# Patient Record
Sex: Male | Born: 1960
Health system: Southern US, Community
[De-identification: ages and names within clinical notes are randomized; demographics above are authoritative.]

## PROBLEM LIST (undated history)

## (undated) DIAGNOSIS — T783XXA Angioneurotic edema, initial encounter: Secondary | ICD-10-CM

## (undated) DIAGNOSIS — Z9989 Dependence on other enabling machines and devices: Secondary | ICD-10-CM

## (undated) DIAGNOSIS — F32A Depression, unspecified: Secondary | ICD-10-CM

## (undated) DIAGNOSIS — J449 Chronic obstructive pulmonary disease, unspecified: Secondary | ICD-10-CM

## (undated) DIAGNOSIS — K649 Unspecified hemorrhoids: Secondary | ICD-10-CM

## (undated) DIAGNOSIS — E119 Type 2 diabetes mellitus without complications: Secondary | ICD-10-CM

## (undated) DIAGNOSIS — E782 Mixed hyperlipidemia: Secondary | ICD-10-CM

## (undated) DIAGNOSIS — G4700 Insomnia, unspecified: Secondary | ICD-10-CM

## (undated) DIAGNOSIS — I10 Essential (primary) hypertension: Secondary | ICD-10-CM

## (undated) DIAGNOSIS — K648 Other hemorrhoids: Secondary | ICD-10-CM

## (undated) DIAGNOSIS — J329 Chronic sinusitis, unspecified: Secondary | ICD-10-CM

## (undated) DIAGNOSIS — F411 Generalized anxiety disorder: Secondary | ICD-10-CM

## (undated) DIAGNOSIS — K5790 Diverticulosis of intestine, part unspecified, without perforation or abscess without bleeding: Secondary | ICD-10-CM

## (undated) DIAGNOSIS — G4733 Obstructive sleep apnea (adult) (pediatric): Secondary | ICD-10-CM

## (undated) DIAGNOSIS — E230 Hypopituitarism: Secondary | ICD-10-CM

## (undated) DIAGNOSIS — R5382 Chronic fatigue, unspecified: Secondary | ICD-10-CM

## (undated) DIAGNOSIS — F40241 Acrophobia: Secondary | ICD-10-CM

## (undated) DIAGNOSIS — F329 Major depressive disorder, single episode, unspecified: Secondary | ICD-10-CM

## (undated) DIAGNOSIS — D126 Benign neoplasm of colon, unspecified: Secondary | ICD-10-CM

## (undated) DIAGNOSIS — M75100 Unspecified rotator cuff tear or rupture of unspecified shoulder, not specified as traumatic: Secondary | ICD-10-CM

## (undated) HISTORY — PX: OTHER SURGICAL HISTORY: SHX169

## (undated) HISTORY — DX: Diverticulosis of intestine, part unspecified, without perforation or abscess without bleeding: K57.90

## (undated) HISTORY — PX: COLONOSCOPY: SHX174

## (undated) HISTORY — PX: WISDOM TOOTH EXTRACTION: SHX21

## (undated) HISTORY — DX: Hypopituitarism: E23.0

## (undated) HISTORY — DX: Mixed hyperlipidemia: E78.2

## (undated) HISTORY — DX: Dependence on other enabling machines and devices: Z99.89

## (undated) HISTORY — DX: Insomnia, unspecified: G47.00

## (undated) HISTORY — DX: Angioneurotic edema, initial encounter: T78.3XXA

## (undated) HISTORY — DX: Chronic sinusitis, unspecified: J32.9

## (undated) HISTORY — PX: HEMORRHOID BANDING: SHX5850

## (undated) HISTORY — DX: Chronic obstructive pulmonary disease, unspecified: J44.9

## (undated) HISTORY — PX: POLYPECTOMY: SHX149

## (undated) HISTORY — DX: Acrophobia: F40.241

## (undated) HISTORY — DX: Essential (primary) hypertension: I10

## (undated) HISTORY — DX: Chronic fatigue, unspecified: R53.82

## (undated) HISTORY — DX: Unspecified hemorrhoids: K64.9

## (undated) HISTORY — DX: Depression, unspecified: F32.A

## (undated) HISTORY — DX: Obstructive sleep apnea (adult) (pediatric): G47.33

## (undated) HISTORY — DX: Unspecified rotator cuff tear or rupture of unspecified shoulder, not specified as traumatic: M75.100

## (undated) HISTORY — DX: Major depressive disorder, single episode, unspecified: F32.9

## (undated) HISTORY — DX: Type 2 diabetes mellitus without complications: E11.9

## (undated) HISTORY — DX: Other hemorrhoids: K64.8

## (undated) HISTORY — DX: Generalized anxiety disorder: F41.1

## (undated) HISTORY — PX: CARDIOVASCULAR STRESS TEST: SHX262

## (undated) HISTORY — DX: Benign neoplasm of colon, unspecified: D12.6

---

## 2004-02-06 ENCOUNTER — Ambulatory Visit (HOSPITAL_COMMUNITY): Admission: RE | Admit: 2004-02-06 | Discharge: 2004-02-06 | Payer: Self-pay | Admitting: Orthopedic Surgery

## 2006-04-27 ENCOUNTER — Ambulatory Visit: Payer: Self-pay | Admitting: Gastroenterology

## 2006-05-04 ENCOUNTER — Ambulatory Visit: Payer: Self-pay | Admitting: Gastroenterology

## 2006-05-07 ENCOUNTER — Ambulatory Visit: Payer: Self-pay | Admitting: Gastroenterology

## 2006-05-07 ENCOUNTER — Encounter (INDEPENDENT_AMBULATORY_CARE_PROVIDER_SITE_OTHER): Payer: Self-pay | Admitting: *Deleted

## 2006-05-07 ENCOUNTER — Encounter (INDEPENDENT_AMBULATORY_CARE_PROVIDER_SITE_OTHER): Payer: Self-pay | Admitting: Specialist

## 2009-08-31 DIAGNOSIS — M75100 Unspecified rotator cuff tear or rupture of unspecified shoulder, not specified as traumatic: Secondary | ICD-10-CM

## 2009-08-31 HISTORY — DX: Unspecified rotator cuff tear or rupture of unspecified shoulder, not specified as traumatic: M75.100

## 2009-10-23 ENCOUNTER — Encounter (INDEPENDENT_AMBULATORY_CARE_PROVIDER_SITE_OTHER): Payer: Self-pay | Admitting: *Deleted

## 2009-10-23 ENCOUNTER — Ambulatory Visit: Payer: Self-pay | Admitting: Gastroenterology

## 2009-10-23 DIAGNOSIS — K625 Hemorrhage of anus and rectum: Secondary | ICD-10-CM

## 2009-11-13 ENCOUNTER — Ambulatory Visit: Payer: Self-pay | Admitting: Gastroenterology

## 2010-09-22 ENCOUNTER — Encounter: Payer: Self-pay | Admitting: Orthopedic Surgery

## 2010-10-01 ENCOUNTER — Other Ambulatory Visit (HOSPITAL_BASED_OUTPATIENT_CLINIC_OR_DEPARTMENT_OTHER): Payer: Self-pay | Admitting: Family Medicine

## 2010-10-01 DIAGNOSIS — M7989 Other specified soft tissue disorders: Secondary | ICD-10-CM

## 2010-10-02 NOTE — Procedures (Signed)
Summary: Colon   Colonoscopy  Procedure date:  05/07/2006  Findings:      Location:  [facility].   Patient Name: Douglas [name], Douglas [name] MRN:  Procedure Procedures: Colonoscopy CPT: [phone].    with polypectomy. CPT: [id number].  Personnel: Endoscopist: Rachael Fee, MD.  Exam Location: Exam performed in Endoscopy Suite. Outpatient  Patient Consent: Procedure, Alternatives, Risks and Benefits discussed, consent obtained, from patient. Consent was obtained by the RN.  Indications Symptoms: Hematochezia. Abdominal pain / bloating.  History  Current Medications: Patient is not currently taking Coumadin.  Comments: Patient history reviewed and updated, pre-procedure physical performed prior to initiation of sedation? yes Pre-Exam Physical: Performed May 07, 2006. Cardio-pulmonary exam, Abdominal exam, Mental status exam WNL.  Exam Exam: Extent of exam reached: Cecum, extent intended: Cecum.  The cecum was identified by appendiceal orifice and IC valve. Patient position: on left side. Time to Cecum: 00:03:23. Time for Withdrawl: 00:11:10. Colon retroflexion performed. Images taken. ASA Classification: II. Tolerance: good.  Monitoring: Pulse and BP monitoring, Oximetry used. Supplemental O2 given.  Colon Prep Prep results: good.  Sedation Meds: Patient assessed and found to be appropriate for moderate (conscious) sedation. Fentanyl 75 mcg. given IV. Versed 8 mg. given IV.  Findings POLYP: Cecum, Maximum size: 2 mm. sessile polyp. Procedure:  snare without cautery, removed, retrieved, Polyp sent to pathology. Polyp sent to pathology. ICD9: Colon Polyps: 211.3. Comments: polyp at appendiceal orifice.  POLYP: Sigmoid Colon, Maximum size: 7 mm. sessile polyp. Procedure:  snare with cautery, removed, retrieved, sent to pathology. sent to pathology. ICD9: Colon Polyps: 211.3.  - DIVERTICULOSIS: Sigmoid Colon to Descending Colon. ICD9: Diverticulosis, Colon:  562.10.  - NORMAL EXAM: Cecum to Rectum. Comments: otherwise normal examination.   Assessment Abnormal examination, see findings above.  Diagnoses: 211.3: Colon Polyps.  562.10: Diverticulosis, Colon.   Comments: Two small colon polyps, no cancers. Events  Unplanned Interventions: No intervention was required.  Unplanned Events: There were no complications. Plans Comments: If the polyps are tubular adenomas, he will need a surveillance colonoscopy in 5 years.  Otherwise he should continue to follow colorectal cancer screening guidelines for "routine risk" patients with a colonoscopy in 10 years. Scheduling/Referral: Await pathology to schedule patient.  This report was created from the original endoscopy report, which was reviewed and signed by the above listed endoscopist.

## 2010-10-02 NOTE — Miscellaneous (Signed)
Summary: rx  Clinical Lists Changes  Medications: Added new medication of ANALPRAM-HC 1-2.5 %  CREA (HYDROCORTISONE ACE-PRAMOXINE) apply to anus 1-2 times a day as needed - Signed Rx of ANALPRAM-HC 1-2.5 %  CREA (HYDROCORTISONE ACE-PRAMOXINE) apply to anus 1-2 times a day as needed;  #1 month x 2;  Signed;  Entered by: Rachael Fee MD;  Authorized by: Rachael Fee MD;  Method used: [facility]  [address] [phone] [address] [place], [place], [place]  [address], Ph: [phone] or [phone], Fax: [phone]    Prescriptions: ANALPRAM-HC 1-2.5 %  CREA (HYDROCORTISONE ACE-PRAMOXINE) apply to anus 1-2 times a day as needed  #1 month x 2   Entered and Authorized by:   Rachael Fee MD   Signed by:   Rachael Fee MD on 10/23/2009   Method used:   [facility] to        [facility]  [address] [phone] ([facility]ail)       [address] [place], [place]  [address]       Ph: [phone] or [phone]       Fax: [phone]   RxID:   [id number]

## 2010-10-02 NOTE — Procedures (Signed)
Summary: Flexible Sigmoidoscopy  Patient: Douglas [name] Note: All result statuses are Final unless otherwise noted.  Tests: (1) Flexible Sigmoidoscopy (FLX)  FLX Flexible Sigmoidoscopy                             DONE     [facility]     [address]     [place], [place]  [address]           FLEXIBLE SIGMOIDOSCOPY PROCEDURE REPORT           PATIENT:  Douglas, [name]  MR#:  [id number]     BIRTHDATE:  1961/06/10, 48 yrs. old  GENDER:  male     ENDOSCOPIST:  Rachael Fee, MD     PROCEDURE DATE:  11/13/2009     PROCEDURE:  Flexible Sigmoidoscopy, diagnostic     ASA CLASS:  Class II     INDICATIONS:  intermittent rectal bleeding, known hemorrhoids;     ?firm area on recent DRE; had adenomas removed 2012     MEDICATIONS:   Fentanyl 50 mcg IV, Versed 7 mg IV           DESCRIPTION OF PROCEDURE:   After the risks benefits and     alternatives of the procedure were thoroughly explained, informed     consent was obtained.  Digital rectal exam was performed and     revealed no rectal masses.   The [id number] endoscope     was introduced through the anus and advanced to the splenic     flexure, without limitations.  The quality of the prep was good.     The instrument was then slowly withdrawn as the mucosa was fully     examined.     <<PROCEDUREIMAGES>>           The rectum and sigmoid and descending colon segments all appeared     normal (see image2, image1, and image4).   Retroflexed views in     the rectum revealed no abnormalities.    The scope was then     withdrawn from the patient and the procedure terminated.           COMPLICATIONS:  None           ENDOSCOPIC IMPRESSION:     1) Normal examination to the splenic flexure     2) Currently no hemorrhoids           RECOMMENDATIONS:     Continue analpram ointment as needed.  If intermittent     hemorrhoidal bleeding becomes more bothersome, would refer to     general surgery.     You will be  scheduled for FULL colonoscopy 05/2011 given personal     history of adenomatous polyps in 2007.           ______________________________     Rachael Fee, MD           n.     eSIGNED:   Rachael Fee at 11/13/2009 02:08 PM           Infantino, Tinnie Gens, [id number]  Note: An exclamation mark (!) indicates a result that was not dispersed into the flowsheet. Document Creation Date: 11/13/2009 2:09 PM _______________________________________________________________________  (1) Order result status: Final Collection or observation date-time: 11/13/2009 14:03 Requested date-time:  Receipt date-time:  Reported date-time:  Referring Physician:   Ordering Physician: Rob Bunting [phone]) Specimen Source:  Source:  [facility] Order Number: [id number] Lab site:   Appended Document: Flexible Sigmoidoscopy    Clinical Lists Changes  Observations: Added new observation of COLONNXTDUE: 05/2011 (11/13/2009 15:15)

## 2010-10-02 NOTE — Letter (Signed)
Summary: [facility]  [address] [place], [place] [address]   Phone: [phone]  Fax: [phone]       Douglas [name]    [birth year]    MRN: [id number]        Procedure Day /Date:11/13/09     Arrival Time:1 pm     Procedure Time:2 pm     Location of Procedure:                    [facility] (4th Floor)   PREPARATION FOR FLEXIBLE SIGMOIDOSCOPY WITH MAGNESIUM CITRATE  Prior to the day before your procedure, purchase one 8 oz. bottle of Magnesium Citrate and one Fleet Enema from the laxative section of your drugstore.  _________________________________________________________________________________________________  THE DAY BEFORE YOUR PROCEDURE             DATE: 11/12/09      DAY: TUE  1.   Have a clear liquid dinner the night before your procedure.  2.   Do not drink anything colored red or purple.  Avoid juices with pulp.  No orange juice.              CLEAR LIQUIDS INCLUDE: Water Jello Ice Popsicles Tea (sugar ok, no milk/cream) Powdered fruit flavored drinks Coffee (sugar ok, no milk/cream) Gatorade Juice: apple, white grape, white cranberry  Lemonade Clear bullion, consomm, broth Carbonated beverages (any kind) Strained chicken noodle soup Hard Candy   3.   At 7:00 pm the night before your procedure, drink one bottle of Magnesium Citrate over ice.  4.   Drink at least 3 more glasses of clear liquids before bedtime (preferably juices).  5.   Results are expected usually within 1 to 6 hours after taking the Magnesium Citrate.  ___________________________________________________________________________________________________  THE DAY OF YOUR PROCEDURE            DATE: 11/13/09     DAY:WED  1.   Use Fleet Enema one hour prior to coming for procedure.  2.   You may drink clear liquids until 12 noon (2 hours before exam)       MEDICATION INSTRUCTIONS  Unless otherwise instructed, you should  take regular prescription medications with a small sip of water as early as possible the morning of your procedure.           OTHER INSTRUCTIONS  You will need a responsible adult at least 52 years of age to accompany you and drive you home.   This person must remain in the waiting room during your procedure.  Wear loose fitting clothing that is easily removed.  Leave jewelry and other valuables at home.  However, you may wish to bring a book to read or an iPod/MP3 player to listen to music as you wait for your procedure to start.  Remove all body piercing jewelry and leave at home.  Total time from sign-in until discharge is approximately 2-3 hours.  You should go home directly after your procedure and rest.  You can resume normal activities the day after your procedure.  The day of your procedure you should not:   Drive   Make legal decisions   Operate machinery   Drink alcohol   Return to work  You will receive specific instructions about eating, activities and medications before you leave.   The above instructions have been reviewed and explained to me by   _______________________    I fully understand and can verbalize  these instructions _____________________________ Date _________

## 2010-10-02 NOTE — Assessment & Plan Note (Signed)
  Review of gastrointestinal problems: 1. adenomatous colon polyps removed by Dr. Christella [name] during colonoscopy 05/2006; next colonoscopy 05/2011. 2. Diverticulosis noted on 2007 colonoscopy    History of Present Illness Visit Type: Initial Consult Primary GI MD: Douglas Bunting MD Primary Provider: n/a Chief Complaint: hemorrhoids  with occasional rectal bleeding History of Present Illness:     very pleasant 50 year old man whom I last saw about 3 years ago. That was at the time of a colonoscopy, see those results above.  who has been taking citrucel about every other day. Has been having problems with hemorrhoids, painful for about a year. He still has alternating bowel habits.  [place].  The hemorrhoids can bleed, usually they are just uncomfortable and he gets some drainage at the site.  Will put OTC creams (like Prep H) at the site.  HE is not constipated at all.           Current Medications (verified): 1)  None  Allergies (verified): No Known Drug Allergies  Vital Signs:  Patient profile:   50 year old male Height:      71 inches Weight:      210.50 pounds BMI:     29.46 Pulse rate:   80 / minute Pulse rhythm:   regular BP sitting:   132 / 86  (left arm)  Vitals Entered By: Douglas [name] NCMA (October 23, 2009 10:32 AM)  Physical Exam  Additional Exam:  Constitutional: generally well appearing Psychiatric: alert and oriented times 3 Abdomen: soft, non-tender, non-distended, normal bowel sounds anorectal examination: small, nonthrombosed external anal hemorrhoid; palpated rather firm area right lateral proximal anal canal, distal rectum. This may be a firm hemorrhoid. Stool is brown   Impression & Recommendations:  Problem # 1:  intermittent rectal bleeding, anal discomfort I suspect this is indeed just intermittent hemorrhoidal issues however I felt that somewhat firm area in his distal rectum and I think he should proceed with  flexible sigmoidoscopy at his soonest convenience to make sure there is nothing more significant going on such as a neoplastic process. In the meantime he will try Analpram ointment to the area when he has flares.  Patient Instructions: 1)  We will set up flexible sigmoidoscopy. 2)  Analpram called in to pharmacy, apply once to twice daily to anus as needed. 3)  The medication list was reviewed and reconciled.  All changed / newly prescribed medications were explained.  A complete medication list was provided to the patient / caregiver.  Appended Document: Orders Update/Flex    Clinical Lists Changes  Problems: Added new problem of RECTAL BLEEDING (ICD-569.3) Orders: Added new Test order of Flex with Sedation (Flex w/Sed) - Signed

## 2010-10-04 ENCOUNTER — Other Ambulatory Visit (HOSPITAL_BASED_OUTPATIENT_CLINIC_OR_DEPARTMENT_OTHER): Payer: Self-pay

## 2010-10-07 ENCOUNTER — Other Ambulatory Visit (HOSPITAL_BASED_OUTPATIENT_CLINIC_OR_DEPARTMENT_OTHER): Payer: Self-pay | Admitting: Family Medicine

## 2010-10-07 DIAGNOSIS — Z139 Encounter for screening, unspecified: Secondary | ICD-10-CM

## 2010-10-08 ENCOUNTER — Ambulatory Visit (HOSPITAL_BASED_OUTPATIENT_CLINIC_OR_DEPARTMENT_OTHER)
Admission: RE | Admit: 2010-10-08 | Discharge: 2010-10-08 | Disposition: A | Discharge status: Home / Self Care | Payer: 59 | Source: Ambulatory Visit | Attending: Family Medicine | Admitting: Family Medicine

## 2010-10-08 ENCOUNTER — Ambulatory Visit (INDEPENDENT_AMBULATORY_CARE_PROVIDER_SITE_OTHER)
Admission: RE | Admit: 2010-10-08 | Discharge: 2010-10-08 | Disposition: A | Discharge status: Home / Self Care | Payer: 59 | Source: Ambulatory Visit | Attending: Family Medicine | Admitting: Family Medicine

## 2010-10-08 DIAGNOSIS — Z139 Encounter for screening, unspecified: Secondary | ICD-10-CM

## 2010-10-08 DIAGNOSIS — M799 Soft tissue disorder, unspecified: Secondary | ICD-10-CM

## 2010-10-08 DIAGNOSIS — M19019 Primary osteoarthritis, unspecified shoulder: Secondary | ICD-10-CM | POA: Insufficient documentation

## 2010-10-08 DIAGNOSIS — M249 Joint derangement, unspecified: Secondary | ICD-10-CM | POA: Insufficient documentation

## 2010-10-08 DIAGNOSIS — Z01818 Encounter for other preprocedural examination: Secondary | ICD-10-CM | POA: Insufficient documentation

## 2010-10-08 DIAGNOSIS — M7989 Other specified soft tissue disorders: Secondary | ICD-10-CM

## 2010-10-08 DIAGNOSIS — M25519 Pain in unspecified shoulder: Secondary | ICD-10-CM | POA: Insufficient documentation

## 2011-01-16 NOTE — Procedures (Signed)
[facility]                            ABDOMINAL ULTRASOUND REPORT   NAME:Douglas [name], Douglas [name]                  MRN:          [id number]  DATE:05/04/2006                            DOB:          1961-04-01    ACCESSION NUMBER:  [id number].   REFERRING PHYSICIAN:  Rachael Fee, M.D.   READING PHYSICIAN:  Malcom T. Russella Dar, MD, [facility]   PROCEDURE:  Multiplanar abdominal ultrasound imaging was performed in the  upright, supine, right and left lateral decubitus positions.   RESULTS:  Abdominal aorta measures 23 cm in maximal diameter. The inferior  vena cava is patent.   The pancreas is not adequately imaged.   Gallbladder has borderline thickened walls measuring 3.2 mm in great  dimension.  The common bile duct measures 4.7 and is normal.   In the liver there is a mild to moderate increase in echo density with  borderline enlarged size.   The right kidney measures 11.4 cm.  The left kidney measures 10.3 cm.  Both  kidneys are normal in appearance.   The spleen measures 14.4 cm, enlarged.  Marland Kitchen   IMPRESSION:  1. Mild to moderate increase in hepatic echo density.  2. Hepatomegaly.  3. Splenomegaly.  4. Borderline thickened gallbladder walls.                                   Venita Lick. Russella Dar, MD, [facility]   MTS/MedQ  DD:  05/04/2006  DT:  05/05/2006  Job #:  [id number]

## 2011-01-16 NOTE — Assessment & Plan Note (Signed)
[facility]                           GASTROENTEROLOGY OFFICE NOTE   NAME:Douglas [name], Douglas [name]                  MRN:          [id number]  DATE:04/27/2006                            DOB:          04/07/1961    NEW PATIENT VISIT.   The patient was self-referred.   REASON FOR VISIT:  Chronic right upper quadrant discomfort, intermittent  abnormal bowels, blood in the stool.   HPI:  Mr. Hussar is a pleasant 50 year old man who has had right upper  quadrant discomfort since 1992.  He says at the outset of this he was very  sick, he had right upper quadrant pains that lasted for several days.  He  underwent quite a lot of testing including ultrasound, sigmoidoscopy, upper  GI stool studies, blood tests and urine tests and tells me that all of these  were essentially normal.  The acute illness eventually improved, but since  then he has been left with chronic right upper quadrant discomfort that  seems to always be there.  He said the only thing that tends to make this  discomfort better is when he does yoga regularly.  When he stops doing yoga,  the pain does seem to become more noticeable.   He also has intermittent abnormal bowels with some minor bleeding.  He  describes rather scybalous stools, sometimes straining to move his bowels.  His dietary changes have improved his bowel habits, but still he will have  to strain sometimes and have scybalous type stools.   REVIEW OF SYSTEMS:  Is essentially normal and is available on  his nursing  intake sheet.   PAST MEDICAL HISTORY:  1. Depression.  2. Allergy trouble.   CURRENT MEDICINES:  Takes multiple supplements including:  1. Red yeast rice.  2. Coenzyme Q 12.  3. Advance __________ Plus.  4. Men Over Forty multivitamin.  5. Choline 250.  6. Inositol 250.  7. Saw Palmetto.  8. Immune 7-1, 6, 10.  9. A medicine called Thinkfast.   ALLERGIES:  NO KNOWN DRUG ALLERGIES.   SOCIAL  HISTORY:  Single, lives by himself, works as an Field seismologist, nonsmoker, drinks approximately 6 beers a month.   FAMILY HISTORY:  Father with diabetes, father with heart disease; no colon  cancer or colon polyps in family.   PHYSICAL EXAM:  He is 5 feet 11 inches, 205 pounds, blood pressure 140/90,  pulse 64.  CONSTITUTIONAL:  Generally well appearing.  NEUROLOGIC:  Alert and oriented x3.  EYES:  Extraocular movements intact.  MOUTH/OROPHARYNX:  Moist, no lesions.  NECK:  Supple.  No lymphadenopathy.  CARDIOVASCULAR:  Regular rate and rhythm.  LUNGS:  Clear to auscultation bilaterally.  ABDOMEN:  Soft, nontender, nondistended, normal bowel sounds.  EXTREMITIES:  No lower extremity edema.  SKIN:  No rash or lesions on his lower extremities.   ASSESSMENT AND PLAN:  A 50 year old man with alternating bowel habits,  likely irritable syndrome, chronic right upper quadrant discomfort.  1. Chronic right upper quadrant discomfort.  Seems unlikely to be biliary      in nature given its constant nature.  That being  said, he has not had a      workup for it in almost 15 years and so I think repeating an ultrasound      at this time as well as getting a basic set of laboratories including a      CBC and complete metabolic profile is reasonable.  2. His alternating bowel habits with scybalous stools does seem like      probably lack of fiber.  He is also on multiple dietary supplements,      any one of which could possibly be causing constipation or loose      stools.  I recommended that he think very hard about continuing these      medicines, they are quite expensive with very limited data about their      efficacy and their side effects.  I think proceeding with full      colonoscopy as soon as convenient is reasonable given the fact that he      has seen some blood in his stool.  Lastly, I recommend he begin taking      Citrucel fiber supplementation one scoop on a daily  basis.                                   Rachael Fee, MD   DPJ/MedQ  DD:  04/27/2006  DT:  04/27/2006  Job #:  [id number]

## 2011-04-08 ENCOUNTER — Encounter: Payer: Self-pay | Admitting: Family Medicine

## 2011-04-08 ENCOUNTER — Telehealth: Payer: Self-pay | Admitting: Family Medicine

## 2011-04-08 ENCOUNTER — Ambulatory Visit (INDEPENDENT_AMBULATORY_CARE_PROVIDER_SITE_OTHER): Payer: 59 | Admitting: Family Medicine

## 2011-04-08 DIAGNOSIS — E230 Hypopituitarism: Secondary | ICD-10-CM

## 2011-04-08 DIAGNOSIS — G4700 Insomnia, unspecified: Secondary | ICD-10-CM

## 2011-04-08 DIAGNOSIS — E291 Testicular hypofunction: Secondary | ICD-10-CM

## 2011-04-08 DIAGNOSIS — D179 Benign lipomatous neoplasm, unspecified: Secondary | ICD-10-CM

## 2011-04-08 HISTORY — DX: Hypopituitarism: E23.0

## 2011-04-08 MED ORDER — TRAZODONE HCL 50 MG PO TABS: ORAL_TABLET | ORAL | Status: DC

## 2011-04-08 MED ORDER — TESTOSTERONE 12.5 MG/ACT (1%) TD GEL: 5.0000 g | Freq: Every day | TRANSDERMAL | Status: DC

## 2011-04-08 NOTE — Assessment & Plan Note (Signed)
Trial of trazodone 50-100mg  qhs prn.  Therapeutic expectations and side effect profile of medication discussed today.  Patient's questions answered.

## 2011-04-08 NOTE — Assessment & Plan Note (Signed)
Restart androgel 4 pumps once daily.

## 2011-04-08 NOTE — Assessment & Plan Note (Signed)
Reassured pt of benign nature of this mass. Monitor for rapid growth or local symptomatology.

## 2011-04-08 NOTE — Telephone Encounter (Signed)
Please request records from [facility] in [place] and from [facility] in [place].  Thx--PM

## 2011-04-08 NOTE — Progress Notes (Signed)
Office Note 04/08/2011  CC:  Chief Complaint  Patient presents with  . Establish Care    HPI:  Douglas [name] is a 50 y.o. White male who is here to establish care. Patient's most recent primary MD: Brooks Sailors in [place] Old records were not reviewed prior to or during today's visit.  Has hx of hypogonadism and his rx for androgel has lapsed.  Wants to restart this at 4 pumps/day b/c his libido and ED were improved on this, although his testosterone levels reportedly did not come up any.  He was put on shots briefly but says these did not make him feel any better.  Also describes new problem of trouble sleeping over the last 4-5 wks since his wife and daughter have been away visiting [place].  No problem with sleep prior to this.  No depression associated with this, although he does miss them and thinks that the change in routine/not having them around is the reason his sleep is likely not going well.  Mind doesn't shut off.  No signif RLS.  Has never had rx hypnotics but has taken advil PM regularly recently and it helped some but he doesn't want to keep taking this.  Past Medical History  Diagnosis Date  . Depression     Prozac in the past not much help.  Spontaneously resolved.  . Adenomatous colon polyp 05/07/06    Due for repeat colonoscopy 05/2011 (Dr. Christella Hartigan)  . Diverticulosis   . Hypogonadism male     Felt improved on andogel but testost levels did not improve.  . Rotator cuff tear 2011    [facility]: conservative mgmt--symptoms stable as of 04/2011.  . 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    No past surgical history on file.NONE  Family History  Problem Relation Age of Onset  . Heart disease Mother   . Hypertension Mother   . Diabetes Mother   . Heart disease Father   . Hypertension Father   . Diabetes Father     History   Social History  . Marital Status: Married    Spouse Name:  N/A    Number of Children: N/A  . Years of Education: N/A   Occupational History  . Not on file.   Social History Main Topics  . Smoking status: Never Smoker   . Smokeless tobacco: Never Used  . Alcohol Use: Not on file  . Drug Use: Not on file  . Sexually Active: Not on file   Other Topics Concern  . 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  Works out regularly.   MEDS: Currently on NO MEDS (the meds below were rx'd at the end of today's visit.) Outpatient Encounter Prescriptions as of 04/08/2011  Medication Sig Dispense Refill  . Multiple Vitamin (MULTIVITAMIN) tablet Take 1 tablet by mouth daily.        . Testosterone (ANDROGEL PUMP) 1.25 GM/ACT (1%) GEL Place 5 g onto the skin daily.  1 Bottle  5  . traZODone (DESYREL) 50 MG tablet 1-2 tabs po qhs prn  30 tablet  1    No Known Allergies  ROS Review of Systems  Constitutional: Negative for fever, chills, appetite change and fatigue.  HENT: Negative for  ear pain, congestion, sore throat, neck stiffness and dental problem.   Eyes: Negative for discharge, redness and visual disturbance.  Respiratory: Negative for cough, chest tightness, shortness of breath and wheezing.   Cardiovascular: Negative for chest pain, palpitations and leg swelling.  Gastrointestinal: Negative for nausea, vomiting, abdominal pain, diarrhea and blood in stool.  Genitourinary: Negative for dysuria, urgency, frequency, hematuria, flank pain and difficulty urinating.  Musculoskeletal: Negative for myalgias, back pain, joint swelling and arthralgias.  Skin: Negative for pallor and rash.       Focal swelling at the back of left shoulder: noted for 1/2 yr or more, no rapid growth.  Neurological: Negative for dizziness, speech difficulty, weakness and headaches.  Hematological: Negative  for adenopathy. Does not bruise/bleed easily.  Psychiatric/Behavioral: Positive for sleep disturbance (as per HPI). Negative for confusion. The patient is not nervous/anxious.      PE; Blood pressure 120/82, pulse 93, height 5\' 11"  (1.803 m), weight 213 lb (96.616 kg), SpO2 97.00%. Gen: Alert, well appearing.  Patient is oriented to person, place, time, and situation. Left shoulder: posterolateral surface with soft, nontender focal soft tissue mass, moveable. Chest: symmetric expansion, nonlabored respirations.  Clear and equal breath sounds in all lung fields.   CV: RRR, no m/r/g.  Peripheral pulses 2+ and symmetric. EXT: no clubbing, cyanosis, or edema.   Pertinent labs:  none  ASSESSMENT AND PLAN:  New patient: will obtain old records.  Insomnia Trial of trazodone 50-100mg  qhs prn.  Therapeutic expectations and side effect profile of medication discussed today.  Patient's questions answered.   Hypogonadism, male Restart androgel 4 pumps once daily.  Lipoma Reassured pt of benign nature of this mass. Monitor for rapid growth or local symptomatology.     Return for at your convenience over the next 59mo for CPE with fasting labs.

## 2011-04-08 NOTE — Telephone Encounter (Signed)
Faxed requests 04/08/11

## 2011-04-24 ENCOUNTER — Encounter: Payer: Self-pay | Admitting: Family Medicine

## 2011-04-24 ENCOUNTER — Other Ambulatory Visit: Payer: Self-pay | Admitting: Family Medicine

## 2011-04-24 ENCOUNTER — Telehealth: Payer: Self-pay | Admitting: Family Medicine

## 2011-04-24 DIAGNOSIS — Z125 Encounter for screening for malignant neoplasm of prostate: Secondary | ICD-10-CM | POA: Insufficient documentation

## 2011-04-24 MED ORDER — ALPRAZOLAM 1 MG PO TABS: ORAL_TABLET | ORAL | Status: DC

## 2011-04-24 NOTE — Telephone Encounter (Signed)
Patient is requesting xanax because he will be working up high.

## 2011-04-24 NOTE — Telephone Encounter (Signed)
Douglas [name]  faxed to pharmacy.

## 2011-04-24 NOTE — Telephone Encounter (Signed)
Rx printed and ready for him to pick up.--PM

## 2011-05-06 ENCOUNTER — Encounter: Payer: Self-pay | Admitting: Gastroenterology

## 2011-05-26 ENCOUNTER — Encounter: Payer: Self-pay | Admitting: Family Medicine

## 2011-05-26 ENCOUNTER — Other Ambulatory Visit: Payer: Self-pay | Admitting: Family Medicine

## 2011-05-26 ENCOUNTER — Ambulatory Visit (INDEPENDENT_AMBULATORY_CARE_PROVIDER_SITE_OTHER): Payer: 59 | Admitting: Family Medicine

## 2011-05-26 VITALS — BP 137/85 | HR 68 | Temp 98.3°F | Ht 71.0 in | Wt 217.0 lb

## 2011-05-26 DIAGNOSIS — B356 Tinea cruris: Secondary | ICD-10-CM

## 2011-05-26 DIAGNOSIS — G4700 Insomnia, unspecified: Secondary | ICD-10-CM

## 2011-05-26 DIAGNOSIS — Z23 Encounter for immunization: Secondary | ICD-10-CM

## 2011-05-26 MED ORDER — TERBINAFINE HCL 250 MG PO TABS: 250.0000 mg | ORAL_TABLET | Freq: Every day | ORAL | Status: DC

## 2011-05-26 MED ORDER — TETANUS-DIPHTH-ACELL PERTUSSIS 5-2.5-18.5 LF-MCG/0.5 IM SUSP: 0.5000 mL | Freq: Once | INTRAMUSCULAR | Status: DC

## 2011-05-26 NOTE — Progress Notes (Signed)
OFFICE NOTE  05/26/2011  CC:  Chief Complaint  Patient presents with  . Rash    not going away- in private area- X 3-4 months- itches     HPI:   Patient is a 50 y.o. Caucasian male who is here for rash. Onset at least 21mo ago in groin area diffusely, a bit red and itchy.  No pain. Tried jock itch cream for a couple of weeks without help.  Tried some eucerin cream w/out help. Wears stiff pants during daytime at work and notes excessive friction in groin area sometimes. Also, he notes that the trazodone I rx'd for him last visit helped well with his insomnia, used it prn only and doesn't have to use it now b/c his wife and daughter are back from [place].  Pertinent PMH:  No prior hx of similar rash. Hypogonadism Insomnia MEDS;   Outpatient Prescriptions Prior to Visit  Medication Sig Dispense Refill  . ALPRAZolam (XANAX) 1 MG tablet 1 tab po q8h prn anxiety  15 tablet  1  . Multiple Vitamin (MULTIVITAMIN) tablet Take 1 tablet by mouth daily.        . Testosterone (ANDROGEL PUMP) 1.25 GM/ACT (1%) GEL Place 5 g onto the skin daily.  1 Bottle  5   No facility-administered medications prior to visit.    PE: Blood pressure 137/85, pulse 68, temperature 98.3 F (36.8 C), temperature source Oral, height 5\' 11"  (1.803 m), weight 217 lb (98.431 kg), SpO2 96.00%. Gen: Alert, well appearing.  Patient is oriented to person, place, time, and situation. SKIN: groin with well demarcated mildly pinkish flaky rash--a serpiginous line of erythema marks the borders on the medial upper thighs.  No vesicles, no papules, no pustules, no maceration.  Nontender.  IMPRESSION AND PLAN:  Tinea cruris Failed 2 wk topical antifungal trial. Will do 14 day course of lamisil 250mg  tab po qd, RF x1.  Fill RF if not completely resolved after 2 wks. Therapeutic expectations and side effect profile of medication discussed today.  Patient's questions answered. Use aquafor topically qd until rash no longer  present. Discussed preventative measures.  Insomnia Resolved. May use trazodone prn as previously prescribed.   Tdap IM given today.  FOLLOW UP:  Return if symptoms worsen or fail to improve.

## 2011-05-26 NOTE — Assessment & Plan Note (Signed)
Resolved. May use trazodone prn as previously prescribed.

## 2011-05-26 NOTE — Assessment & Plan Note (Signed)
Failed 2 wk topical antifungal trial. Will do 14 day course of lamisil 250mg  tab po qd, RF x1.  Fill RF if not completely resolved after 2 wks. Therapeutic expectations and side effect profile of medication discussed today.  Patient's questions answered. Use aquafor topically qd until rash no longer present. Discussed preventative measures.

## 2011-06-02 ENCOUNTER — Encounter: Payer: Self-pay | Admitting: Family Medicine

## 2011-07-31 ENCOUNTER — Ambulatory Visit (INDEPENDENT_AMBULATORY_CARE_PROVIDER_SITE_OTHER): Payer: 59 | Admitting: Family Medicine

## 2011-07-31 ENCOUNTER — Encounter: Payer: Self-pay | Admitting: Family Medicine

## 2011-07-31 VITALS — BP 162/88 | HR 69 | Temp 97.8°F | Ht 71.0 in | Wt 218.0 lb

## 2011-07-31 DIAGNOSIS — R030 Elevated blood-pressure reading, without diagnosis of hypertension: Secondary | ICD-10-CM

## 2011-07-31 DIAGNOSIS — B369 Superficial mycosis, unspecified: Secondary | ICD-10-CM

## 2011-07-31 DIAGNOSIS — B49 Unspecified mycosis: Secondary | ICD-10-CM

## 2011-07-31 MED ORDER — TERBINAFINE HCL 250 MG PO TABS: 250.0000 mg | ORAL_TABLET | Freq: Every day | ORAL | Status: DC

## 2011-07-31 MED ORDER — MICONAZOLE NITRATE 2 % EX AERP: INHALATION_SPRAY | CUTANEOUS | Status: DC

## 2011-07-31 NOTE — Patient Instructions (Signed)
Follow up as needed

## 2011-07-31 NOTE — Progress Notes (Signed)
OFFICE NOTE  07/31/2011  CC:  Chief Complaint  Patient presents with  . Rash    improved, but did not go away after 2 rounds of meds     HPI: Patient is a 50 y.o. Caucasian male who is here for rash. Took lamisil 250mg  qd x 24mo for tinea cruris that I saw him for a couple of months ago.  The rash resolved but has started to return. Reddish, itchy rash in groin/thigh creases.  Tries to pay attention to keeping things dry. No OTC meds have been applied this time.  Nothing makes it better or worse.  He feels fine otherwise. Denies polyuria, polydipsia, polyphagia, or wt loss.  Pertinent PMH:  Hypogonadism Insomnia Tinea cruris  Pertinent Meds:Cialis, androgel  PE: Blood pressure 162/88, pulse 69, temperature 97.8 F (36.6 C), temperature source Oral, height 5\' 11"  (1.803 m), weight 218 lb (98.884 kg). Gen: Alert, well appearing.  Patient is oriented to person, place, time, and situation. SKIN: pinkish discoloration to skin in groin creases bilat, without maceration and without vesicles or pustules.  Well demarcated borders without satellite lesions.  Scrotum and penis appear normal, without rash.    IMPRESSION AND PLAN: Recurrent tinea cruris. I recommended he take another 2 wk course of lamisil 250mg  po qd. I also recommended he use miconazole powder daily as a preventative measure. Will also screen for diabetes since recurrent fungal infections can sometimes be a sign of hyperglycemia. Fasting labs entered for future lab visit.  FOLLOW UP: prn

## 2011-08-06 ENCOUNTER — Other Ambulatory Visit (INDEPENDENT_AMBULATORY_CARE_PROVIDER_SITE_OTHER): Payer: 59

## 2011-08-06 ENCOUNTER — Other Ambulatory Visit: Payer: Self-pay | Admitting: Family Medicine

## 2011-08-06 DIAGNOSIS — B369 Superficial mycosis, unspecified: Secondary | ICD-10-CM

## 2011-08-06 DIAGNOSIS — R030 Elevated blood-pressure reading, without diagnosis of hypertension: Secondary | ICD-10-CM

## 2011-08-06 DIAGNOSIS — B49 Unspecified mycosis: Secondary | ICD-10-CM

## 2011-08-06 LAB — CBC WITH DIFFERENTIAL/PLATELET
Basophils Absolute: 0 10*3/uL (ref 0.0–0.1)
Eosinophils Absolute: 0.2 10*3/uL (ref 0.0–0.7)
HCT: 42.7 % (ref 39.0–52.0)
Lymphs Abs: 2.3 10*3/uL (ref 0.7–4.0)
Monocytes Absolute: 0.5 10*3/uL (ref 0.1–1.0)
Platelets: 233 10*3/uL (ref 150.0–400.0)
RBC: 4.95 Mil/uL (ref 4.22–5.81)
WBC: 8 10*3/uL (ref 4.5–10.5)

## 2011-08-06 LAB — COMPREHENSIVE METABOLIC PANEL
Albumin: 4.7 g/dL (ref 3.5–5.2)
CO2: 29 mEq/L (ref 19–32)
Calcium: 9.4 mg/dL (ref 8.4–10.5)
GFR: 103.27 mL/min (ref >60.00)
Glucose, Bld: 102 mg/dL — ABNORMAL HIGH (ref 70–99)
Potassium: 4.2 mEq/L (ref 3.5–5.1)
Sodium: 140 mEq/L (ref 135–145)

## 2011-08-06 LAB — LDL CHOLESTEROL, DIRECT: Direct LDL: 60.2 mg/dL

## 2011-08-06 LAB — TSH: TSH: 0.37 u[IU]/mL (ref 0.35–5.50)

## 2011-08-06 LAB — LIPID PANEL
Total CHOL/HDL Ratio: 5
Triglycerides: 352 mg/dL — ABNORMAL HIGH (ref 0.0–149.0)

## 2011-09-30 ENCOUNTER — Ambulatory Visit (INDEPENDENT_AMBULATORY_CARE_PROVIDER_SITE_OTHER): Payer: 59 | Admitting: Family Medicine

## 2011-09-30 ENCOUNTER — Encounter: Payer: Self-pay | Admitting: Family Medicine

## 2011-09-30 VITALS — BP 146/90 | HR 74 | Temp 98.2°F | Ht 71.0 in | Wt 220.0 lb

## 2011-09-30 DIAGNOSIS — R55 Syncope and collapse: Secondary | ICD-10-CM

## 2011-09-30 MED ORDER — TRAZODONE HCL 50 MG PO TABS: ORAL_TABLET | ORAL | Status: DC

## 2011-09-30 MED ORDER — CITALOPRAM HYDROBROMIDE 20 MG PO TABS: 20.0000 mg | ORAL_TABLET | Freq: Every day | ORAL | Status: DC
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
## 2011-09-30 NOTE — Assessment & Plan Note (Signed)
Although he has had some lifestyle habits that have likely contributed to his sx's (not eating, working out while not eating, drinking 5 hour energy drinks), he has had 2 episode that have been unrelated to these things.   His sx's are suggestive of panic disorder and we discussed starting SSRI for this today and he is willing to try. Citalopram 20mg  qd (1/2 tab qd x 5d to start, then increase to whole tab), and he may resume 50mg  trazodone qhs prn insomnia.  Therapeutic expectations and side effect profile of medication discussed today.  Patient's questions answered. He has stopped drinking 5 hr energy drinks and has been careful to eat more regularly (esp prior to working out).   He admits to being a worrier that doesn't show it on the outside, so hopefully the citalopram will be helpful for GAD even if these are not panic attacks he is having.   EKG today unremarkable.  If these episodes worsen or become more suggestive of cardiac etiology will do necessary further w/u. Will check TSH, free T4, and T3 total today b/c his TSH was borderline low on labs 08/2011.

## 2011-11-26 ENCOUNTER — Other Ambulatory Visit: Payer: Self-pay | Admitting: *Deleted

## 2011-11-26 MED ORDER — TESTOSTERONE 12.5 MG/ACT (1%) TD GEL: 5.0000 g | Freq: Every day | TRANSDERMAL | Status: DC

## 2011-11-26 NOTE — Telephone Encounter (Signed)
Faxed refill request received from pharmacy for androgel Last seen on 09/30/11 Follow up needed in one month, but no follow up scheduled. Last filled 04/08/11, #1 x 5 Please advise refills.

## 2011-12-07 ENCOUNTER — Ambulatory Visit (INDEPENDENT_AMBULATORY_CARE_PROVIDER_SITE_OTHER): Payer: 59 | Admitting: Family Medicine

## 2011-12-07 ENCOUNTER — Encounter: Payer: Self-pay | Admitting: Family Medicine

## 2011-12-07 VITALS — BP 132/81 | HR 62 | Temp 98.2°F | Wt 219.0 lb

## 2011-12-07 DIAGNOSIS — T783XXA Angioneurotic edema, initial encounter: Secondary | ICD-10-CM | POA: Insufficient documentation

## 2011-12-07 MED ORDER — PREDNISONE 20 MG PO TABS: ORAL_TABLET | ORAL | Status: DC

## 2011-12-07 MED ORDER — EPINEPHRINE 0.3 MG/0.3ML IJ DEVI: 0.3000 mg | Freq: Once | INTRAMUSCULAR | Status: DC

## 2011-12-07 MED ORDER — TESTOSTERONE 12.5 MG/ACT (1%) TD GEL: 5.0000 g | Freq: Every day | TRANSDERMAL | Status: DC

## 2011-12-07 MED ORDER — METHYLPREDNISOLONE ACETATE 40 MG/ML IJ SUSP
40.0000 mg | Freq: Once | INTRAMUSCULAR | Status: AC
  Administered 2011-12-07: 40 mg via INTRAMUSCULAR

## 2011-12-07 NOTE — Assessment & Plan Note (Signed)
Recurrent, idiopathic. No sign of this reaction progressing past lip swelling at this time (several hours after onset). Recommended 40mg  depo-medrol IM in office today, continue with 40mg  prednisone qd x 4d starting tomorrow.  Continue antihistamine until swelling is completely gone.  Epi-pen rx'd today and demonstrated use, explained scenarios for which use of this would be appropriate. Referral to [facility] was ordered today.

## 2011-12-07 NOTE — Progress Notes (Signed)
OFFICE NOTE  12/07/2011  CC:  Chief Complaint  Patient presents with  . Facial Swelling    upper lip, began this AM     HPI: Patient is a 51 y.o. Caucasian male who is here for upper lip swelling--mild itching and loss of sensation/tingling.  No pain.  Onset this morning almost 4 hours ago while eating banana and drinking water at work.  Started with tingling/pain left corner of lips/mouth for a while prior to that, then "just took off" and the entire upper lip swelled.  No tongue swelling, no sensation of throat closing, no SOB, no dizziness, no malaise.  This is the 4th time over the last 2 mo that this has happened to him--all "minor" compared to this one in that the lip didn't swell as much and the duration of swelling was only a couple of hours.  No antihistamine was tried in the past but this morning about 2 hours ago a coworker gave him two hydroxyzine tabs that his doctor had rx'd him for the same thing. Pt reports other coworkers with similar reactions but has no real details about this: trigger unknown. He has had this happen while at work and also while at home, no trigger identified (black rice started recently, but ? After onset of these episodes).   Pertinent PMH:  Past Medical History  Diagnosis Date  . Depression     Prozac in the past not much help.  Spontaneously resolved.  . Adenomatous colon polyp 05/07/06    Due for repeat colonoscopy 05/2011 (Dr. Christella Hartigan)  . Diverticulosis   . Hypogonadism male     Felt improved on andogel but testost levels did not improve.  . Rotator cuff tear 2011    [facility]: conservative mgmt--symptoms stable as of 04/2011.  . 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  . Insomnia   . Hypertriglyceridemia     fish oil OTC  No FH of angioedema  MEDS:  Outpatient Prescriptions Prior to Visit  Medication Sig Dispense Refill  . CIALIS 20 MG tablet       . Miconazole  Nitrate 2 % AERP Use as directed for prevention of fungal infection    0  . traZODone (DESYREL) 50 MG tablet 1 tab po qhs prn insomnia  30 tablet  1  . citalopram (CELEXA) 20 MG tablet Take 1 tablet (20 mg total) by mouth daily.  30 tablet  1  . Testosterone (ANDROGEL PUMP) 1.25 GM/ACT (1%) GEL Place 5 g onto the skin daily.  1 Bottle  5  . traZODone (DESYREL) 50 MG tablet 1-2 tabs po qhs prn  30 tablet  1  Of note: pt stopped citalopram b/c he says it caused him to have hemorrhoids. He has run out of androgel and needs RF.  He has taken trazodone only once in the last couple of months.  PE: Blood pressure 132/81, pulse 62, temperature 98.2 F (36.8 C), temperature source Temporal, weight 219 lb (99.338 kg). Gen: Alert, well appearing.  Patient is oriented to person, place, time, and situation. Face: upper lip approx 2 times the size of his normal, with small healing apthous ulcer on right side of upper lip.  No surrounding erythema, no tenderness.  Lower lip without swelling, tongue and eyes without swelling, uvula and throat without swelling.   Neck: no LAD, tenderness, mass, or thyromegaly. CV: RRR, no m/r/g.   LUNGS:  CTA bilat, nonlabored resps, good aeration in all lung fields. Skin - no sores or suspicious lesions or rashes or color changes   IMPRESSION AND PLAN:  Angioedema of lips Recurrent, idiopathic. No sign of this reaction progressing past lip swelling at this time (several hours after onset). Recommended 40mg  depo-medrol IM in office today, continue with 40mg  prednisone qd x 4d starting tomorrow.  Continue antihistamine until swelling is completely gone.  Epi-pen rx'd today and demonstrated use, explained scenarios for which use of this would be appropriate. Referral to [facility] was ordered today.      FOLLOW UP: prn

## 2012-01-11 ENCOUNTER — Ambulatory Visit (INDEPENDENT_AMBULATORY_CARE_PROVIDER_SITE_OTHER): Payer: 59 | Admitting: Family Medicine

## 2012-01-11 ENCOUNTER — Encounter: Payer: Self-pay | Admitting: Family Medicine

## 2012-01-11 VITALS — BP 138/86 | HR 76 | Temp 98.4°F | Ht 71.0 in | Wt 216.0 lb

## 2012-01-11 DIAGNOSIS — J449 Chronic obstructive pulmonary disease, unspecified: Secondary | ICD-10-CM

## 2012-01-11 DIAGNOSIS — J45901 Unspecified asthma with (acute) exacerbation: Secondary | ICD-10-CM

## 2012-01-11 DIAGNOSIS — J4489 Other specified chronic obstructive pulmonary disease: Secondary | ICD-10-CM

## 2012-01-11 DIAGNOSIS — J45909 Unspecified asthma, uncomplicated: Secondary | ICD-10-CM

## 2012-01-11 DIAGNOSIS — T783XXA Angioneurotic edema, initial encounter: Secondary | ICD-10-CM

## 2012-01-11 HISTORY — DX: Other specified chronic obstructive pulmonary disease: J44.89

## 2012-01-11 HISTORY — DX: Chronic obstructive pulmonary disease, unspecified: J44.9

## 2012-01-11 MED ORDER — ALBUTEROL SULFATE HFA 108 (90 BASE) MCG/ACT IN AERS: 2.0000 | INHALATION_SPRAY | Freq: Four times a day (QID) | RESPIRATORY_TRACT | Status: DC | PRN

## 2012-01-11 MED ORDER — AZITHROMYCIN 250 MG PO TABS: ORAL_TABLET | ORAL | Status: DC

## 2012-01-11 MED ORDER — PREDNISONE 20 MG PO TABS: ORAL_TABLET | ORAL | Status: DC

## 2012-01-11 NOTE — Assessment & Plan Note (Signed)
Prednisone 40mg  qd x 5d, then 20mg  qd x 5d. Ventolin 2 puffs q4h prn. Z-pack. F/u 5-7d.

## 2012-01-11 NOTE — Progress Notes (Signed)
OFFICE NOTE  01/11/2012  CC:  Chief Complaint  Patient presents with  . Cough    non productive cough, wheezing; improves with prn Mucinex-D     HPI: Patient is a 51 y.o. Caucasian male who is here for respiratory complaints. Pt presents complaining of respiratory symptoms for 3 wks.  Primary symptoms are: nasal congestion w/out drainage, tight, dry cough, chest feels tight.  Worst symptoms seems to be the cough.  Lately the symptoms seem to be worsening.  Works around dust a lot lately. Pertinent negatives: No fevers and no SOB. No pain in face or teeth.  No significant HA.  ST mild at most.   Symptoms made worse by nothing.  Symptoms improved by some OTC cough meds (mucinex). Smoker? no Recent sick contact? Now known Muscle or joint aches? no   Additional ROS: no n/v/d or abdominal pain.  No rash.  No neck stiffness.   +Mild fatigue.  +Mild appetite loss.   Pertinent PMH:  Past Medical History  Diagnosis Date  . Depression     Prozac in the past not much help.  Spontaneously resolved.  . Adenomatous colon polyp 05/07/06    Due for repeat colonoscopy 05/2011 (Dr. Christella Hartigan)  . Diverticulosis   . Hypogonadism male     Felt improved on andogel but testost levels did not improve.  . Rotator cuff tear 2011    [facility]: conservative mgmt--symptoms stable as of 04/2011.  . 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  . Insomnia   . Hypertriglyceridemia     fish oil OTC   Past surgical, social, and family history reviewed and no changes noted since last office visit.  MEDS:  Outpatient Prescriptions Prior to Visit  Medication Sig Dispense Refill  . CIALIS 20 MG tablet Take 20 mg by mouth daily as needed.       Marland Kitchen EPINEPHrine (EPIPEN) 0.3 mg/0.3 mL DEVI Inject 0.3 mLs (0.3 mg total) into the muscle once.  1 Device  3  . Testosterone (ANDROGEL PUMP) 1.25 GM/ACT (1%) GEL Place 5 g onto the skin daily.  1  Bottle  5  . traZODone (DESYREL) 50 MG tablet 1 tab po qhs prn insomnia  30 tablet  1  . citalopram (CELEXA) 20 MG tablet Take 1 tablet (20 mg total) by mouth daily.  30 tablet  1  . Miconazole Nitrate 2 % AERP Use as directed for prevention of fungal infection    0  . predniSONE (DELTASONE) 20 MG tablet 2 tabs po qd x 4d, starting 12/08/11  8 tablet  0  . traZODone (DESYREL) 50 MG tablet 1-2 tabs po qhs prn  30 tablet  1  **not currently taking prednisone  PE: Blood pressure 138/86, pulse 76, temperature 98.4 F (36.9 C), temperature source Temporal, height 5\' 11"  (1.803 m), weight 216 lb (97.977 kg), SpO2 94.00%. VS: noted--normal. Gen: alert, NAD, NONTOXIC APPEARING. HEENT: eyes without injection, drainage, or swelling.  Ears: EACs clear, TMs with normal light reflex and landmarks.  Nose: Clear rhinorrhea, with some dried, crusty exudate adherent to mildly injected mucosa.  No purulent d/c.  No paranasal sinus TTP.  No facial swelling.  Throat and mouth without focal lesion.  No pharyngial swelling, erythema, or exudate.   Neck: supple, no LAD.   LUNGS: diffuse insp rhonchi, diffuse exp wheezing (coarse), with mildly prolonged exp phase.  Nonlabored resps, aeration is decent.  He coughs a lot after expiration.  CV: RRR, no m/r/g. EXT: no c/c/e SKIN: no rash  IMPRESSION AND PLAN:  Acute asthmatic bronchitis Prednisone 40mg  qd x 5d, then 20mg  qd x 5d. Ventolin 2 puffs q4h prn. Z-pack. F/u 5-7d.      FOLLOW UP: 5-7d

## 2012-01-18 ENCOUNTER — Ambulatory Visit (HOSPITAL_BASED_OUTPATIENT_CLINIC_OR_DEPARTMENT_OTHER)
Admission: RE | Admit: 2012-01-18 | Discharge: 2012-01-18 | Disposition: A | Discharge status: Home / Self Care | Payer: 59 | Source: Ambulatory Visit | Attending: Family Medicine | Admitting: Family Medicine

## 2012-01-18 ENCOUNTER — Encounter: Payer: Self-pay | Admitting: Family Medicine

## 2012-01-18 ENCOUNTER — Ambulatory Visit (INDEPENDENT_AMBULATORY_CARE_PROVIDER_SITE_OTHER): Payer: 59 | Admitting: Family Medicine

## 2012-01-18 VITALS — BP 133/81 | HR 75 | Temp 97.9°F | Ht 71.0 in | Wt 217.0 lb

## 2012-01-18 DIAGNOSIS — R0609 Other forms of dyspnea: Secondary | ICD-10-CM

## 2012-01-18 DIAGNOSIS — J45901 Unspecified asthma with (acute) exacerbation: Secondary | ICD-10-CM

## 2012-01-18 DIAGNOSIS — R0600 Dyspnea, unspecified: Secondary | ICD-10-CM

## 2012-01-18 DIAGNOSIS — R05 Cough: Secondary | ICD-10-CM | POA: Insufficient documentation

## 2012-01-18 DIAGNOSIS — R0989 Other specified symptoms and signs involving the circulatory and respiratory systems: Secondary | ICD-10-CM

## 2012-01-18 DIAGNOSIS — R5381 Other malaise: Secondary | ICD-10-CM | POA: Insufficient documentation

## 2012-01-18 DIAGNOSIS — J45909 Unspecified asthma, uncomplicated: Secondary | ICD-10-CM

## 2012-01-18 DIAGNOSIS — R059 Cough, unspecified: Secondary | ICD-10-CM | POA: Insufficient documentation

## 2012-01-18 MED ORDER — IPRATROPIUM BROMIDE 0.02 % IN SOLN
0.5000 mg | Freq: Once | RESPIRATORY_TRACT | Status: AC
  Administered 2012-01-18: 0.5 mg via RESPIRATORY_TRACT

## 2012-01-18 MED ORDER — ALBUTEROL SULFATE (5 MG/ML) 0.5% IN NEBU: 2.5000 mg | INHALATION_SOLUTION | Freq: Once | RESPIRATORY_TRACT | Status: DC

## 2012-01-18 MED ORDER — METHYLPREDNISOLONE ACETATE 40 MG/ML IJ SUSP
40.0000 mg | Freq: Once | INTRAMUSCULAR | Status: AC
  Administered 2012-01-18: 40 mg via INTRAMUSCULAR

## 2012-01-18 NOTE — Assessment & Plan Note (Signed)
No improved with a z-pack, 7d of prednisone, + says ventolin not helping at all when used prn. I noted significant improvement in aeration on exam and he noted subjective improvement in breathing after 2.5mg  alb/0.5mg  atrovent today in office. Plan to give depo-medrol 40mg  IM today in office, continue current steroid taper, and gave rx for aerochamber to see if we can get better delivery of his bronchodilator to the lungs. Will check CXR today as well to r/u consolidation.  At this time, however, no indication for another antibiotic.

## 2012-01-18 NOTE — Progress Notes (Signed)
OFFICE NOTE  01/18/2012  CC:  Chief Complaint  Patient presents with  . Breathing Problem    easily out of breath, fatigued, no energy, sleeping a lot, nasal congestion and cough, finished zithromax; feels worse than before     HPI: Patient is a 51 y.o. Caucasian male who is here for 7 day f/u for recent acute bronchitis illness. No better, maybe worse: fatigue and SOB biggest complaints.  Cough mostly nonproductive.  Some periorbital and facial pressure/tension, not much nasal mucous, though. No chest pain.  +Chest tight.  PO intake good.  No fevers. No n/v/d.  No rash.  + HA.  No ST. No myalgias or arthralgias.  Pertinent PMH:  Past Medical History  Diagnosis Date  . Depression     Prozac in the past not much help.  Spontaneously resolved.  . Adenomatous colon polyp 05/07/06    Due for repeat colonoscopy 05/2011 (Dr. Christella Hartigan)  . Diverticulosis   . Hypogonadism male     Felt improved on andogel but testost levels did not improve.  . Rotator cuff tear 2011    [facility]: conservative mgmt--symptoms stable as of 04/2011.  . 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  . Insomnia   . Hypertriglyceridemia     fish oil OTC  . Angioedema of lips     saw allergist 11/2011    MEDS:  Outpatient Prescriptions Prior to Visit  Medication Sig Dispense Refill  . albuterol (VENTOLIN HFA) 108 (90 BASE) MCG/ACT inhaler Inhale 2 puffs into the lungs every 6 (six) hours as needed for wheezing.  1 Inhaler  0  . CIALIS 20 MG tablet Take 20 mg by mouth daily as needed.       . citalopram (CELEXA) 20 MG tablet Take 1 tablet (20 mg total) by mouth daily.  30 tablet  1  . EPINEPHrine (EPIPEN) 0.3 mg/0.3 mL DEVI Inject 0.3 mLs (0.3 mg total) into the muscle once.  1 Device  3  . Miconazole Nitrate 2 % AERP Use as directed for prevention of fungal infection    0  . predniSONE (DELTASONE) 20 MG tablet 2 tabs po qd x 5d, then 1 tab  po qd x 5d  15 tablet  0  . pseudoephedrine-guaifenesin (MUCINEX D) 60-600 MG per tablet Take 1 tablet by mouth every 12 (twelve) hours as needed.      . Testosterone (ANDROGEL PUMP) 1.25 GM/ACT (1%) GEL Place 5 g onto the skin daily.  1 Bottle  5  . traZODone (DESYREL) 50 MG tablet 1 tab po qhs prn insomnia  30 tablet  1  . azithromycin (ZITHROMAX) 250 MG tablet 2 tabs po qd x 1d, then 1 tab po qd x 4d  6 each  0    PE: Blood pressure 133/81, pulse 75, temperature 97.9 F (36.6 C), temperature source Temporal, height 5\' 11"  (1.803 m), weight 217 lb (98.431 kg), SpO2 94.00%. VS: noted--normal. Gen: alert, NAD, NONTOXIC APPEARING. HEENT: eyes without injection, drainage, or swelling.  Ears: EACs clear, TMs with normal light reflex and landmarks.  Nose: Clear rhinorrhea, with some dried, crusty exudate adherent to mildly injected mucosa.  No purulent d/c.  No paranasal sinus TTP.  No facial swelling.  Throat and mouth without focal lesion.  No pharyngial swelling, erythema, or exudate.   Neck: supple, no LAD.   LUNGS:  Bibasilar insp rhonchi and coarse exp wheezing,  mild prolongation of exp phase. Nonlabored resps.  No insp crackles or areas of focally diminished breath sounds. CV: RRR, no m/r/g. EXT: no c/c/e SKIN: no rash  IMPRESSION AND PLAN:  Acute asthmatic bronchitis No improved with a z-pack, 7d of prednisone, + says ventolin not helping at all when used prn. I noted significant improvement in aeration on exam and he noted subjective improvement in breathing after 2.5mg  alb/0.5mg  atrovent today in office. Plan to give depo-medrol 40mg  IM today in office, continue current steroid taper, and gave rx for aerochamber to see if we can get better delivery of his bronchodilator to the lungs. Will check CXR today as well to r/u consolidation.  At this time, however, no indication for another antibiotic.    FOLLOW UP:  He'll call in 2-3 days if not improving and he knows he can make appt at  any time for office recheck as well.

## 2012-01-21 ENCOUNTER — Other Ambulatory Visit: Payer: Self-pay | Admitting: *Deleted

## 2012-01-21 MED ORDER — TADALAFIL 20 MG PO TABS: 20.0000 mg | ORAL_TABLET | Freq: Every day | ORAL | Status: DC | PRN

## 2012-01-21 NOTE — Telephone Encounter (Signed)
Cialis rx authorized. I am glad he's feeling better.  Reassure him that the cough will gradually dissipate over the next 1-2 wks, hopefully earlier.--thx

## 2012-01-21 NOTE — Telephone Encounter (Signed)
Faxed refill request received from pharmacy for Cialis Last filled by MD on 12/04/10, #6 x 2 No follow up in system. Please advise refill.  Also, pt calls with symptom update.  He is feeling better, although he is still coughing.

## 2012-01-28 ENCOUNTER — Encounter: Payer: Self-pay | Admitting: Family Medicine

## 2012-01-28 ENCOUNTER — Ambulatory Visit (INDEPENDENT_AMBULATORY_CARE_PROVIDER_SITE_OTHER): Payer: 59 | Admitting: Family Medicine

## 2012-01-28 VITALS — BP 126/80 | HR 67 | Temp 97.9°F | Ht 71.0 in | Wt 210.0 lb

## 2012-01-28 DIAGNOSIS — T783XXA Angioneurotic edema, initial encounter: Secondary | ICD-10-CM

## 2012-01-28 MED ORDER — PREDNISONE 20 MG PO TABS: ORAL_TABLET | ORAL | Status: DC

## 2012-01-28 NOTE — Progress Notes (Signed)
OFFICE NOTE  01/28/2012  CC:  Chief Complaint  Patient presents with  . Facial Swelling    lips, per nurse throat was constricted, began at work last night, did not use EpiPen, has been taking benadryl     HPI: Patient is a 51 y.o. Caucasian male who is here for lip swelling again. Onset of hand itching soon after getting to work last night, noted index fingers itching and turned a bit red but no swelling or hives.  He then started having lower lip swelling, soon followed by upper lip swelling.  He took benadryl 2 times over the last 9 hours or so and things are improved.  Lips + itching, and he denies feeling like he had any tongue or throat swelling but his employer nurse said she thought his tongue looked swollen. He was told to take benadryl and was sent home.  No SOB, no new wheezing (has had some bronchitis lately), no chest pain.  No other areas of swelling such as eyes, no skin rash. He takes allegra qd IRREGULARLY but did take one the day before he went in to work yesterday.   Pertinent PMH:  Past Medical History  Diagnosis Date  . Depression     Prozac in the past not much help.  Spontaneously resolved.  . Adenomatous colon polyp 05/07/06    Due for repeat colonoscopy 05/2011 (Dr. Christella Hartigan)  . Diverticulosis   . Hypogonadism male     Felt improved on andogel but testost levels did not improve.  . Rotator cuff tear 2011    [facility]: conservative mgmt--symptoms stable as of 04/2011.  . 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  . Insomnia   . Hypertriglyceridemia     fish oil OTC  . Angioedema of lips     saw allergist 11/2011    MEDS:  Outpatient Prescriptions Prior to Visit  Medication Sig Dispense Refill  . albuterol (VENTOLIN HFA) 108 (90 BASE) MCG/ACT inhaler Inhale 2 puffs into the lungs every 6 (six) hours as needed for wheezing.  1 Inhaler  0  . citalopram (CELEXA) 20 MG tablet Take 1  tablet (20 mg total) by mouth daily.  30 tablet  1  . EPINEPHrine (EPIPEN) 0.3 mg/0.3 mL DEVI Inject 0.3 mLs (0.3 mg total) into the muscle once.  1 Device  3  . Miconazole Nitrate 2 % AERP Use as directed for prevention of fungal infection    0  . tadalafil (CIALIS) 20 MG tablet Take 1 tablet (20 mg total) by mouth daily as needed.  10 tablet  5  . Testosterone (ANDROGEL PUMP) 1.25 GM/ACT (1%) GEL Place 5 g onto the skin daily.  1 Bottle  5  . traZODone (DESYREL) 50 MG tablet 1 tab po qhs prn insomnia  30 tablet  1  . predniSONE (DELTASONE) 20 MG tablet 2 tabs po qd x 5d, then 1 tab po qd x 5d  15 tablet  0  . pseudoephedrine-guaifenesin (MUCINEX D) 60-600 MG per tablet Take 1 tablet by mouth every 12 (twelve) hours as needed.       Past surgical, social, and family history reviewed and no changes noted since last office visit.  PE: Blood pressure 126/80, pulse 67, temperature 97.9 F (36.6 C), temperature source Temporal, height 5\' 11"  (1.803 m), weight 210 lb (95.255 kg). Gen: Alert, well appearing.  Patient is oriented to person, place,  time, and situation. ENT: upper lip generalized swelling > lower lip mild gen swelling.  No erythema or rash.  Tongue, buccal mucosa, pharynx without swelling or erythema or exudate.   Eyes without erythema or swelling.   Neck: no LAD or tenderness. CV: RRR, no m/r/g.   LUNGS: CTA bilat, nonlabored resps, good aeration in all lung fields. Skin - no sores or suspicious lesions or rashes or color changes   IMPRESSION AND PLAN: Angioedema of lips, recurrent.  This current episode is improving now.  Question of whether or not this episode involved some tongue or throat swelling, but he does admit that each episode seems a bit worse than the prior. Will get him back to Dr. Barnetta Chapel, allergist, for possible further testing. Will give oral steroid taper (40 qd x 3d, 20 qd x 3d, 10 qd x 4d) and advised him to take his allegra DAILY.  Also take benadryl bid while  lips still swollen. He will get me the contact number of the physician at his place of employment so we can try to figure out what may be a trigger at his workplace.  FOLLOW UP: prn

## 2012-02-12 ENCOUNTER — Ambulatory Visit (INDEPENDENT_AMBULATORY_CARE_PROVIDER_SITE_OTHER): Payer: 59 | Admitting: Family Medicine

## 2012-02-12 ENCOUNTER — Encounter: Payer: Self-pay | Admitting: Family Medicine

## 2012-02-12 VITALS — BP 112/74 | HR 73 | Temp 98.0°F | Ht 71.0 in | Wt 214.0 lb

## 2012-02-12 DIAGNOSIS — J45901 Unspecified asthma with (acute) exacerbation: Secondary | ICD-10-CM

## 2012-02-12 DIAGNOSIS — J069 Acute upper respiratory infection, unspecified: Secondary | ICD-10-CM

## 2012-02-12 DIAGNOSIS — J45909 Unspecified asthma, uncomplicated: Secondary | ICD-10-CM

## 2012-02-12 MED ORDER — MONTELUKAST SODIUM 10 MG PO TABS: 10.0000 mg | ORAL_TABLET | Freq: Every day | ORAL | Status: DC

## 2012-02-12 NOTE — Progress Notes (Signed)
OFFICE NOTE  02/12/2012  CC:  Chief Complaint  Patient presents with  . URI    head/chest congestion returned 4-5 days ago, wheezing     HPI: Patient is a 51 y.o. Caucasian male who is here for 3 wk follow up for asthmatic bronchitis. Got significantly better after last visit but he is still gaining his energy and feeling of normal breathing.  No chest tightness or chest pain.  Last albuterol use was at least 7d ago. Cough is gone.  Nasal and sinus pressure onset yesterday--got him worried that things were coming with bronchitis again. No HA or fever.  No ST.  No excessive sneezing.  No itchy/runny eyes.  No ear sx's.  No upper teeth pain.   Pertinent PMH:  Past Medical History  Diagnosis Date  . Depression     Prozac in the past not much help.  Spontaneously resolved.  . Adenomatous colon polyp 05/07/06    Due for repeat colonoscopy 05/2011 (Dr. Christella Hartigan)  . Diverticulosis   . Hypogonadism male     Felt improved on andogel but testost levels did not improve.  . Rotator cuff tear 2011    [facility]: conservative mgmt--symptoms stable as of 04/2011.  . 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  . Insomnia   . Hypertriglyceridemia     fish oil OTC  . Angioedema of lips     saw allergist 11/2011    MEDS:  Outpatient Prescriptions Prior to Visit  Medication Sig Dispense Refill  . albuterol (VENTOLIN HFA) 108 (90 BASE) MCG/ACT inhaler Inhale 2 puffs into the lungs every 6 (six) hours as needed for wheezing.  1 Inhaler  0  . citalopram (CELEXA) 20 MG tablet Take 1 tablet (20 mg total) by mouth daily.  30 tablet  1  . diphenhydrAMINE (BENADRYL) 25 MG tablet Take 25 mg by mouth every 6 (six) hours as needed.      Marland Kitchen EPINEPHrine (EPIPEN) 0.3 mg/0.3 mL DEVI Inject 0.3 mLs (0.3 mg total) into the muscle once.  1 Device  3  . Miconazole Nitrate 2 % AERP Use as directed for prevention of fungal infection    0  .  tadalafil (CIALIS) 20 MG tablet Take 1 tablet (20 mg total) by mouth daily as needed.  10 tablet  5  . Testosterone (ANDROGEL PUMP) 1.25 GM/ACT (1%) GEL Place 5 g onto the skin daily.  1 Bottle  5  . traZODone (DESYREL) 50 MG tablet 1 tab po qhs prn insomnia  30 tablet  1  . predniSONE (DELTASONE) 20 MG tablet 2 tabs po qd x 3d, then 1 tab po qd x 3d, then 1/2 tab po qd x 4d, then stop  11 tablet  0  Allegra 180mg  qd for last 2 wks OTC nasal spray prn  PE: Blood pressure 112/74, pulse 73, temperature 98 F (36.7 C), temperature source Temporal, height 5\' 11"  (1.803 m), weight 214 lb (97.07 kg), SpO2 96.00%. VS: noted--normal. Gen: alert, NAD, NONTOXIC APPEARING. HEENT: eyes without injection, drainage, or swelling.  Ears: EACs clear, TMs with normal light reflex and landmarks.  Nose: Clear rhinorrhea, with some dried, crusty exudate adherent to mildly injected mucosa.  No purulent d/c.  No paranasal sinus TTP.  No facial swelling.  Throat and mouth without focal lesion.  No pharyngial swelling, erythema, or exudate.   Neck: supple, no LAD.   LUNGS: CTA  bilat, nonlabored resps.   CV: RRR, no m/r/g. EXT: no c/c/e SKIN: no rash   IMPRESSION AND PLAN: Acute asthmatic bronchitis Resolved with systemic steroid taper + albuterol prn. We decided to get him on singulair today for bronchospasm tendency of late + worsened allergy sx's of late.  Viral URI Superimposed on allergic rhinitis. Self-limited nature of this illness was discussed, questions answered.  Discussed symptomatic care, rest, fluids.   Warning signs/symptoms of worsening illness were discussed.  Patient instructed to call or return if any of these occur. Started singulair trial today 10mg  qd.     FOLLOW UP:  1 month

## 2012-02-14 NOTE — Assessment & Plan Note (Signed)
Superimposed on allergic rhinitis. Self-limited nature of this illness was discussed, questions answered.  Discussed symptomatic care, rest, fluids.   Warning signs/symptoms of worsening illness were discussed.  Patient instructed to call or return if any of these occur. Started singulair trial today 10mg  qd.

## 2012-02-14 NOTE — Assessment & Plan Note (Signed)
Resolved with systemic steroid taper + albuterol prn. We decided to get him on singulair today for bronchospasm tendency of late + worsened allergy sx's of late.

## 2012-02-23 ENCOUNTER — Other Ambulatory Visit (INDEPENDENT_AMBULATORY_CARE_PROVIDER_SITE_OTHER): Payer: 59

## 2012-02-23 ENCOUNTER — Ambulatory Visit (INDEPENDENT_AMBULATORY_CARE_PROVIDER_SITE_OTHER): Payer: 59 | Admitting: Internal Medicine

## 2012-02-23 ENCOUNTER — Other Ambulatory Visit: Payer: Self-pay | Admitting: Internal Medicine

## 2012-02-23 ENCOUNTER — Encounter: Payer: Self-pay | Admitting: Internal Medicine

## 2012-02-23 VITALS — BP 112/80 | HR 83 | Ht 70.0 in | Wt 216.6 lb

## 2012-02-23 DIAGNOSIS — T783XXA Angioneurotic edema, initial encounter: Secondary | ICD-10-CM

## 2012-02-23 DIAGNOSIS — J019 Acute sinusitis, unspecified: Secondary | ICD-10-CM

## 2012-02-23 DIAGNOSIS — J45901 Unspecified asthma with (acute) exacerbation: Secondary | ICD-10-CM

## 2012-02-23 DIAGNOSIS — J45909 Unspecified asthma, uncomplicated: Secondary | ICD-10-CM

## 2012-02-23 LAB — CBC WITH DIFFERENTIAL/PLATELET
Eosinophils Relative: 6.8 % — ABNORMAL HIGH (ref 0.0–5.0)
HCT: 42.3 % (ref 39.0–52.0)
Lymphs Abs: 2.9 10*3/uL (ref 0.7–4.0)
MCV: 84.9 fl (ref 78.0–100.0)
Monocytes Absolute: 0.5 10*3/uL (ref 0.1–1.0)
Neutro Abs: 4.6 10*3/uL (ref 1.4–7.7)
Platelets: 215 10*3/uL (ref 150.0–400.0)
RDW: 14.1 % (ref 11.5–14.6)
WBC: 8.6 10*3/uL (ref 4.5–10.5)

## 2012-02-23 LAB — SEDIMENTATION RATE: Sed Rate: 14 mm/hr (ref 0–22)

## 2012-02-23 MED ORDER — LEVALBUTEROL HCL 0.63 MG/3ML IN NEBU
0.6300 mg | INHALATION_SOLUTION | Freq: Once | RESPIRATORY_TRACT | Status: AC
  Administered 2012-02-23: 0.63 mg via RESPIRATORY_TRACT

## 2012-02-23 MED ORDER — PREDNISONE 20 MG PO TABS: ORAL_TABLET | ORAL | Status: DC

## 2012-02-23 MED ORDER — METHYLPREDNISOLONE ACETATE 80 MG/ML IJ SUSP
80.0000 mg | Freq: Once | INTRAMUSCULAR | Status: AC
  Administered 2012-02-23: 80 mg via INTRAMUSCULAR

## 2012-02-23 NOTE — Progress Notes (Signed)
02/23/12- 51 yoM never smoker seen for his complaint of "bronchitis/cough". He is self-referred. PCP Dr Milinda Cave [facility]. He denies history of seasonal allergic rhinitis, asthma or lung disease. His problem began with an apparent cold in April. 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 He sometimes wears a mask. By May he said he had sinus congestion "like cement" then onset of cough and chest congestion. He was diagnosed with bronchitis and had chest x-ray. He took a few days off of work with little difference but then had a few days of vacation in [place] where he did better. On return, he again developed sinus congestion. Was treated initially with a Z-Pak, then Singulair and prednisone tapers twice. He then began daily Allegra which seemed to help his sinuses some. He has had persistent chest tightness with variable dry cough and wheeze. He never had fever, purulent discharge or headache. He denies pressure discomfort in his ears, GI upset, rash or adenopathy.  He had allergy skin testing twice at [facility] practice at [place], which is not part of this practice. We got a copy of skin test record from 12/21/2011 showing moderate intradermal reactions to a wide variety of common inhalant allergens. He says the skin testing was done for evaluation of a separate complaint of angioedema of the lips. This  happened twice at work. He was treated with antihistamines, including his Allegra, with no recurrence.  He has no history of ENT surgery. He describes his home as clean and dry with a brand-new HVAC and no mold. His family is well.  CXR- 01/18/12-  CHEST - 2 VIEW  Comparison: None  Findings: The cardiac silhouette, mediastinal and hilar contours  are within normal limits. There are mild bronchitic type  interstitial lung changes and streaky basilar atelectasis which may  suggest bronchitis or  reactive airways disease. No findings for  focal infiltrate, edema or effusion. The bony thorax is intact.  IMPRESSION:  Mild bronchitic type lung changes but no infiltrates or effusions.  Original Report Authenticated By: P. Loralie Champagne, M.D.   Prior to Admission medications   Medication Sig Start Date End Date Taking? Authorizing Provider  albuterol (VENTOLIN HFA) 108 (90 BASE) MCG/ACT inhaler Inhale 2 puffs into the lungs every 6 (six) hours as needed for wheezing. 01/11/12 01/10/13 Yes Jeoffrey Massed, MD  diphenhydrAMINE (BENADRYL) 25 MG tablet Take 25 mg by mouth every 6 (six) hours as needed.   Yes Historical Provider, MD  EPINEPHrine (EPIPEN) 0.3 mg/0.3 mL DEVI Inject 0.3 mLs (0.3 mg total) into the muscle once. 12/07/11  Yes Jeoffrey Massed, MD  Fexofenadine HCl (ALLEGRA PO) Take by mouth daily.   Yes Historical Provider, MD  montelukast (SINGULAIR) 10 MG tablet Take 1 tablet (10 mg total) by mouth at bedtime. 02/12/12 02/11/13 Yes Jeoffrey Massed, MD  tadalafil (CIALIS) 20 MG tablet Take 1 tablet (20 mg total) by mouth daily as needed. 01/21/12  Yes Jeoffrey Massed, MD  Testosterone (ANDROGEL PUMP) 1.25 GM/ACT (1%) GEL Place 5 g onto the skin daily. 12/07/11  Yes Jeoffrey Massed, MD  traZODone (DESYREL) 50 MG tablet 1 tab po qhs prn insomnia 09/30/11  Yes Jeoffrey Massed, MD  citalopram (CELEXA) 20 MG tablet Take 1 tablet (20 mg total) by mouth daily. 09/30/11 09/29/12  Jeoffrey Massed, MD  Miconazole Nitrate 2 % AERP Use as directed  for prevention of fungal infection 07/31/11   Jeoffrey Massed, MD  predniSONE (DELTASONE) 20 MG tablet 3 x 3 days, 2 x 3 days, 1 x 3 days, 1/2 x 4 days, then stop 02/23/12 02/22/13  Waymon Budge, MD   Past Medical History  Diagnosis Date  . Depression     Prozac in the past not much help.  Spontaneously resolved.  . Adenomatous colon polyp 05/07/06    Due for repeat colonoscopy 05/2011 (Dr. Christella Hartigan)  . Diverticulosis   . Hypogonadism male     Felt  improved on andogel but testost levels did not improve.  . Rotator cuff tear 2011    [facility]: conservative mgmt--symptoms stable as of 04/2011.  . 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  . Insomnia   . Hypertriglyceridemia     fish oil OTC  . Angioedema of lips     saw allergist 11/2011   Past Surgical History  Procedure Date  . Adnoids   . Wisdom tooth extraction    Family History  Problem Relation Age of Onset  . Heart disease Mother   . Hypertension Mother   . Diabetes Mother   . Heart disease Father   . Hypertension Father   . Diabetes Father    History   Social History  . Marital Status: Married    Spouse Name: N/A    Number of Children: N/A  . Years of Education: N/A   Occupational History  . Not on file.   Social History Main Topics  . Smoking status: Never Smoker   . Smokeless tobacco: Never Used  . Alcohol Use: Yes     rarely  . Drug Use: No  . Sexually Active: Not on file   Other Topics Concern  . 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  Works out regularly.   ROS-see HPI Constitutional:   No-   weight loss, night sweats, fevers, chills, fatigue, lassitude. HEENT:   No-  headaches, difficulty swallowing, tooth/dental problems, sore throat,       No-  sneezing, itching, ear ache,  + nasal congestion, post nasal drip,  CV:  No- anginal  chest pain, orthopnea, PND, swelling in lower extremities, anasarca, dizziness, palpitations Resp: +  shortness of breath with exertion or at rest.              No-   productive cough,  + non-productive cough,  No- coughing up of blood.              No-   change in color of mucus.  No- wheezing.   Skin: No-   rash or lesions. GI:  No-   heartburn, indigestion,  abdominal pain, nausea, vomiting, diarrhea,                 change in bowel habits, loss of appetite GU: No-   dysuria, change in color of urine, no urgency or frequency.  No- flank pain. MS:  No-   joint pain or swelling.  No- decreased range of motion.  No- back pain. Neuro-     nothing unusual Psych:  No- change in mood or affect. No depression or anxiety.  No memory loss.  OBJ- Physical  Exam General- Alert, Oriented, Affect-appropriate, Distress- none acute, fit- appearing Skin- rash-none, lesions- none, excoriation- none Lymphadenopathy- none Head- atraumatic            Eyes- Gross vision intact, PERRLA, conjunctivae and secretions clear            Ears- Hearing, canals-normal            Nose- + turbinate edema, no-Septal dev, mucus, polyps, erosion, perforation             Throat- Mallampati III , mucosa clear , drainage- none, tonsils- atrophic Neck- flexible , trachea midline, no stridor , thyroid nl, carotid no bruit Chest - symmetrical excursion , unlabored           Heart/CV- RRR , no murmur , no gallop  , no rub, nl s1 s2                           - JVD- none , edema- none, stasis changes- none, varices- none           Lung- + coarse wheeze, cough- none , dullness-none, rub- none           Chest wall-  Abd- tender-no, distended-no, bowel sounds-present, HSM- no Br/ Gen/ Rectal- Not done, not indicated Extrem- cyanosis- none, clubbing, none, atrophy- none, strength- nl Neuro- grossly intact to observation

## 2012-02-23 NOTE — Patient Instructions (Addendum)
Prednisone taper sent  Sample LABA/ ICS- Dulera 200       2 puffs then rinse mouth, twice daily  Neb xop 1.25  Depo 80  Order- limited CT sinus     Acute sinusitis  Order- lab- CBC w/ diff, sed rate, Allergy profile- full and food IGE       Dx   angioedema

## 2012-02-25 ENCOUNTER — Ambulatory Visit (INDEPENDENT_AMBULATORY_CARE_PROVIDER_SITE_OTHER)
Admission: RE | Admit: 2012-02-25 | Discharge: 2012-02-25 | Disposition: A | Discharge status: Home / Self Care | Payer: 59 | Source: Ambulatory Visit | Attending: Internal Medicine | Admitting: Internal Medicine

## 2012-02-25 DIAGNOSIS — J019 Acute sinusitis, unspecified: Secondary | ICD-10-CM

## 2012-02-25 LAB — ALLERGY FULL AND FOOD SPECIFIC PROFILE
Allergen, D pternoyssinus,d7: <0.1 kU/L (ref <0.35)
Alternaria Alternata: <0.1 kU/L (ref <0.35)
Apple: <0.1 kU/L (ref <0.35)
Aspergillus fumigatus, IgG: <0.1 kU/L (ref <0.35)
Bahia Grass: <0.1 kU/L (ref <0.35)
Cat Dander: <0.1 kU/L (ref <0.35)
Chicken IgE: <0.1 kU/L (ref <0.35)
Common Ragweed: 0.14 kU/L (ref <0.35)
Corn: <0.1 kU/L (ref <0.35)
D. farinae: <0.1 kU/L (ref <0.35)
Dog Dander: <0.1 kU/L (ref <0.35)
Elm IgE: <0.1 kU/L (ref <0.35)
Fescue: <0.1 kU/L (ref <0.35)
Fish Cod: <0.1 kU/L (ref <0.35)
G009 Red Top: <0.1 kU/L (ref <0.35)
Goldenrod: <0.1 kU/L (ref <0.35)
Helminthosporium halodes: <0.1 kU/L (ref <0.35)
House Dust Hollister: <0.1 kU/L (ref <0.35)
Lamb's Quarters: 0.11 kU/L (ref <0.35)
Milk IgE: <0.1 kU/L (ref <0.35)
Plantain: <0.1 kU/L (ref <0.35)
Sycamore Tree: <0.1 kU/L (ref <0.35)
Tuna IgE: <0.1 kU/L (ref <0.35)
Wheat IgE: 0.12 kU/L (ref <0.35)

## 2012-02-25 NOTE — Progress Notes (Signed)
Quick Note:  Pt aware of results. ______ 

## 2012-02-25 NOTE — Progress Notes (Signed)
Quick Note:  LMTCB ______ 

## 2012-02-26 ENCOUNTER — Telehealth: Payer: Self-pay | Admitting: Internal Medicine

## 2012-02-26 NOTE — Progress Notes (Signed)
Quick Note:  Spoke with pt. He is aware of Ct sinus results per Dr. Maple Hudson. ______

## 2012-02-26 NOTE — Telephone Encounter (Signed)
Notes Recorded by Waymon Budge, MD on 02/25/2012 at 1:36 PM CT sinus- mild to moderate sinus disease, probably chronic.  ----  Called, spoke with pt.  I informed him of above CT sinus results per Dr. Maple Hudson.  He would like to know if he needs to do anything given these results.  Dr. Maple Hudson, pls advise.  Thank you.

## 2012-02-26 NOTE — Telephone Encounter (Signed)
[email or web address] to inform pt of CDY's recs and see which pharm he would like rxs sent to.

## 2012-02-26 NOTE — Telephone Encounter (Signed)
Per CY-suggest Dymista #1 2 ouffs each nostril every night at bedtime and Augmentin 875 mg #20 take 1 po bid no refills.

## 2012-02-28 DIAGNOSIS — J329 Chronic sinusitis, unspecified: Secondary | ICD-10-CM

## 2012-02-28 HISTORY — DX: Chronic sinusitis, unspecified: J32.9

## 2012-02-28 NOTE — Assessment & Plan Note (Addendum)
Plan-limited CT scan of sinuses. Prednisone taper, Depo-Medrol today

## 2012-02-28 NOTE — Assessment & Plan Note (Signed)
He associates this with his workplace exposure. No recurrence since he has been on maintenance antihistamine.

## 2012-02-28 NOTE — Assessment & Plan Note (Addendum)
It seems unlikely that he would suddenly developed an intense allergic rhinitis and asthmatic bronchitis due to  seasonal allergens this spring, since he has been a long-time resident of this area. I am suspicious that he had a viral illness, at least as part of the syndrome. He associates episodes of angioedema of the lips with his workplace,  so it may be that he is sensitized to something in his workplace exposure. Moderate atypia suggested by the skin test results we have available from April. That pattern would not have developed abruptly but he may not pay attention in previous years. Plan-CBC with differential, sedimentation rate, allergy profiles, nebulizer treatment Xopenex, Dulera 200, limited CT of sinuses, prednisone taper from 60 mg and Depo-Medrol today. Steroid talk done.

## 2012-02-29 MED ORDER — AZELASTINE-FLUTICASONE 137-50 MCG/ACT NA SUSP: 2.0000 | Freq: Every day | NASAL | Status: DC

## 2012-02-29 MED ORDER — AMOXICILLIN-POT CLAVULANATE 875-125 MG PO TABS: 1.0000 | ORAL_TABLET | Freq: Two times a day (BID) | ORAL | Status: AC

## 2012-02-29 NOTE — Telephone Encounter (Signed)
Pt advised rx sent to [facility].Carron Curie, CMA

## 2012-02-29 NOTE — Telephone Encounter (Signed)
Pt returned call.  Call him back at [phone] Leanora Ivanoff

## 2012-03-17 ENCOUNTER — Telehealth: Payer: Self-pay | Admitting: Internal Medicine

## 2012-03-17 MED ORDER — BUDESONIDE-FORMOTEROL FUMARATE 160-4.5 MCG/ACT IN AERO: 2.0000 | INHALATION_SPRAY | Freq: Two times a day (BID) | RESPIRATORY_TRACT | Status: DC

## 2012-03-17 NOTE — Telephone Encounter (Signed)
Pt advised of recs and rx sent to [facility].Carron Curie, CMA

## 2012-03-17 NOTE — Telephone Encounter (Signed)
LMTCB

## 2012-03-17 NOTE — Telephone Encounter (Signed)
Called and spoke with pt. He states started to notice wheezing last night and this has continues to bother him today so far. He states that he has felt slightly more SOB than usual the past several days also. Denies any cough "yet", CP or tightness in chest, f/c/s. Would like recs on what to do before this gets worse. CDY out of the office so will forward to doc of the day. Dr. Craige Cotta, please advise, thanks! No Known Allergies

## 2012-03-17 NOTE — Telephone Encounter (Signed)
Call in script for symbicort 160/4.5.  He should 2 puffs twice per day and rinse mouth after each use.  He should use this for one week, and then he can stop unless his symptoms persist.  He should call back if he does not improve.

## 2012-03-25 ENCOUNTER — Encounter: Payer: Self-pay | Admitting: Gastroenterology

## 2012-04-01 ENCOUNTER — Ambulatory Visit (INDEPENDENT_AMBULATORY_CARE_PROVIDER_SITE_OTHER): Payer: 59 | Admitting: Family Medicine

## 2012-04-01 ENCOUNTER — Encounter: Payer: Self-pay | Admitting: Family Medicine

## 2012-04-01 VITALS — BP 120/84 | HR 71 | Temp 98.0°F | Ht 71.0 in | Wt 211.0 lb

## 2012-04-01 DIAGNOSIS — J069 Acute upper respiratory infection, unspecified: Secondary | ICD-10-CM

## 2012-04-01 DIAGNOSIS — J45909 Unspecified asthma, uncomplicated: Secondary | ICD-10-CM

## 2012-04-01 DIAGNOSIS — J45901 Unspecified asthma with (acute) exacerbation: Secondary | ICD-10-CM

## 2012-04-01 MED ORDER — AZELASTINE-FLUTICASONE 137-50 MCG/ACT NA SUSP: 2.0000 | Freq: Every day | NASAL | Status: DC

## 2012-04-01 MED ORDER — ALBUTEROL SULFATE HFA 108 (90 BASE) MCG/ACT IN AERS: 2.0000 | INHALATION_SPRAY | Freq: Four times a day (QID) | RESPIRATORY_TRACT | Status: DC | PRN

## 2012-04-01 MED ORDER — PREDNISONE 20 MG PO TABS: ORAL_TABLET | ORAL | Status: DC

## 2012-04-01 MED ORDER — AMOXICILLIN-POT CLAVULANATE 875-125 MG PO TABS: 1.0000 | ORAL_TABLET | Freq: Two times a day (BID) | ORAL | Status: AC

## 2012-04-01 MED ORDER — BUDESONIDE-FORMOTEROL FUMARATE 160-4.5 MCG/ACT IN AERO: 2.0000 | INHALATION_SPRAY | Freq: Two times a day (BID) | RESPIRATORY_TRACT | Status: DC

## 2012-04-01 NOTE — Assessment & Plan Note (Signed)
With URI/possible bacterial sinusitis, as well. Plan: Augmentin 875mg  bid x 10d.   Prednisone taper, RF ventolin.  Restart singulair.  Continue allegra and azelastine-fluticasone.

## 2012-04-01 NOTE — Progress Notes (Signed)
OFFICE NOTE  04/01/2012  CC:  Chief Complaint  Patient presents with  . Wheezing    trainer began noticing patient wheezing during exercise     HPI: Patient is a 51 y.o. Caucasian male who is here for wheezing. Pt presents complaining of respiratory symptoms for 5 days.  Primary symptoms are: wheezing and mild SOB, feels sinus pressure, some disequelibrium/presyncope sx's last night with some nausea.  Worst symptoms seems to be the wheezing.  Lately the symptoms seem to be worsening. Pertinent negatives: No fevers.  He does have pain in face/sinuses.  Mild HA.  ST initially but gone now. Symptoms made worse by nothing that he notes.  Symptoms improved by albuterol/benadryl. Smoker? no Recent sick contact? no Muscle or joint aches? no   Additional ROS: no n/v/d or abdominal pain.  No rash.  No neck stiffness.   No fatigue.  No appetite loss.   Pertinent PMH:  Past Medical History  Diagnosis Date  . Depression     Prozac in the past not much help.  Spontaneously resolved.  . Adenomatous colon polyp 05/07/06    Due for repeat colonoscopy 05/2011 (Dr. Christella Hartigan)  . Diverticulosis   . Hypogonadism male     Felt improved on andogel but testost levels did not improve.  . Rotator cuff tear 2011    [facility]: conservative mgmt--symptoms stable as of 04/2011.  . 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  . Insomnia   . Hypertriglyceridemia     fish oil OTC  . Angioedema of lips     saw allergist 11/2011    MEDS:  Outpatient Prescriptions Prior to Visit  Medication Sig Dispense Refill  . albuterol (VENTOLIN HFA) 108 (90 BASE) MCG/ACT inhaler Inhale 2 puffs into the lungs every 6 (six) hours as needed for wheezing.  1 Inhaler  0  . Azelastine-Fluticasone 137-50 MCG/ACT SUSP Place 2 puffs into the nose at bedtime.  23 g  0  . budesonide-formoterol (SYMBICORT) 160-4.5 MCG/ACT inhaler Inhale 2 puffs into the lungs  2 (two) times daily.  1 Inhaler  0  . diphenhydrAMINE (BENADRYL) 25 MG tablet Take 25 mg by mouth every 6 (six) hours as needed.      Marland Kitchen EPINEPHrine (EPIPEN) 0.3 mg/0.3 mL DEVI Inject 0.3 mLs (0.3 mg total) into the muscle once.  1 Device  3  . Fexofenadine HCl (ALLEGRA PO) Take by mouth daily.      . tadalafil (CIALIS) 20 MG tablet Take 1 tablet (20 mg total) by mouth daily as needed.  10 tablet  5  . Testosterone (ANDROGEL PUMP) 1.25 GM/ACT (1%) GEL Place 5 g onto the skin daily.  1 Bottle  5  . traZODone (DESYREL) 50 MG tablet 1 tab po qhs prn insomnia  30 tablet  1  . citalopram (CELEXA) 20 MG tablet Take 1 tablet (20 mg total) by mouth daily.  30 tablet  1  . montelukast (SINGULAIR) 10 MG tablet Take 1 tablet (10 mg total) by mouth at bedtime.  30 tablet  6  . Miconazole Nitrate 2 % AERP Use as directed for prevention of fungal infection    0  . predniSONE (DELTASONE) 20 MG tablet 3 x 3 days, 2 x 3 days, 1 x 3 days, 1/2 x 4 days, then stop  20 tablet  0    PE: Blood pressure 120/84, pulse 71, temperature 98 F (36.7  C), temperature source Temporal, height 5\' 11"  (1.803 m), weight 211 lb (95.709 kg), SpO2 92.00%. Gen: Alert, well appearing.  Patient is oriented to person, place, time, and situation. ENT: Ears: EACs clear, normal epithelium.  TMs with good light reflex and landmarks bilaterally.  Eyes: no injection, icteris, swelling, or exudate.  EOMI, PERRLA. Nose: no drainage, but diffuse turbinate edema noted.  No injection or focal lesion.  Mouth: lips without lesion/swelling.  Oral mucosa pink and moist.  Dentition intact and without obvious caries or gingival swelling.  Oropharynx without erythema, exudate, or swelling.  Neck - No masses or thyromegaly or limitation in range of motion CV: RRR, no m/r/g LUNGS: diffuse expiratory wheezing with mildly prolonged expiratory phase.  Aeration is decent, breathing is nonlabored. EXT: no clubbing, cyanosis, or edema.   LABS: none today, but  CT of sinuses done 02/26/12 showed mild paranasal sinus dz, mostly right maxillary and both frontal sinuses.  IMPRESSION AND PLAN:  Acute asthmatic bronchitis With URI/possible bacterial sinusitis, as well. Plan: Augmentin 875mg  bid x 10d.   Prednisone taper, RF ventolin.  Restart singulair.  Continue allegra and azelastine-fluticasone.     FOLLOW UP: 5-7d

## 2012-04-05 ENCOUNTER — Ambulatory Visit: Payer: 59 | Admitting: Internal Medicine

## 2012-04-20 ENCOUNTER — Encounter: Payer: Self-pay | Admitting: Internal Medicine

## 2012-04-20 ENCOUNTER — Ambulatory Visit (INDEPENDENT_AMBULATORY_CARE_PROVIDER_SITE_OTHER): Payer: 59 | Admitting: Internal Medicine

## 2012-04-20 VITALS — BP 128/84 | HR 66 | Ht 70.0 in | Wt 213.8 lb

## 2012-04-20 DIAGNOSIS — J329 Chronic sinusitis, unspecified: Secondary | ICD-10-CM

## 2012-04-20 DIAGNOSIS — J4489 Other specified chronic obstructive pulmonary disease: Secondary | ICD-10-CM

## 2012-04-20 DIAGNOSIS — T783XXA Angioneurotic edema, initial encounter: Secondary | ICD-10-CM

## 2012-04-20 DIAGNOSIS — J449 Chronic obstructive pulmonary disease, unspecified: Secondary | ICD-10-CM

## 2012-04-20 MED ORDER — AZELASTINE-FLUTICASONE 137-50 MCG/ACT NA SUSP: 1.0000 | Freq: Every day | NASAL | Status: DC

## 2012-04-20 NOTE — Patient Instructions (Addendum)
Continue Symbicort 160  - The ususal dose is 2 puffs, then rinse mouth twice daily. If you are doing really well, you can try maintaining on just 2 puffs once daily.  Add sample and script for Dymista nasal spray   1-2 puffs each nostril once daily at bedtime  Continue Singulair/ montelukast for now  Use the rescue albuterol HFA inhaler if needed  Stop Astelin nasal spray

## 2012-04-20 NOTE — Progress Notes (Signed)
02/23/12- 50 yoM never smoker seen for his complaint of "bronchitis/cough". He is self-referred. PCP Dr Milinda Cave [facility]. He denies history of seasonal allergic rhinitis, asthma or lung disease. His problem began with an apparent cold in April. 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 He sometimes wears a mask. By May he said he had sinus congestion "like cement" then onset of cough and chest congestion. He was diagnosed with bronchitis and had chest x-ray. He took a few days off of work with little difference but then had a few days of vacation in [place] where he did better. On return, he again developed sinus congestion. Was treated initially with a Z-Pak, then Singulair and prednisone tapers twice. He then began daily Allegra which seemed to help his sinuses some. He has had persistent chest tightness with variable dry cough and wheeze. He never had fever, purulent discharge or headache. He denies pressure discomfort in his ears, GI upset, rash or adenopathy.  He had allergy skin testing twice at [facility] practice at [place], which is not part of this practice. We got a copy of skin test record from 12/21/2011 showing moderate intradermal reactions to a wide variety of common inhalant allergens. He says the skin testing was done for evaluation of a separate complaint of angioedema of the lips. This  happened twice at work. He was treated with antihistamines, including his Allegra, with no recurrence.  He has no history of ENT surgery. He describes his home as clean and dry with a brand-new HVAC and no mold. His family is well.  CXR- 01/18/12-  CHEST - 2 VIEW  Comparison: None  Findings: The cardiac silhouette, mediastinal and hilar contours  are within normal limits. There are mild bronchitic type  interstitial lung changes and streaky basilar atelectasis which may  suggest bronchitis or  reactive airways disease. No findings for  focal infiltrate, edema or effusion. The bony thorax is intact.  IMPRESSION:  Mild bronchitic type lung changes but no infiltrates or effusions.  Original Report Authenticated By: P. Loralie Champagne, M.D.   04/20/12- 79 yoM never smoker followed for asthmatic bronchitis, rhinisinusitis,  complicated by hx angioedema, + allergy skin tests, Patient states has had bronchitis twice since last visit. States antibiotics help when he is on them but bronchitis comes back when he finishes them. Has started taking a "protective blend oil" and it has helped. States is still having sinus congestion.  Wheezing started again 2 weeks ago,  one week after end of last course of antibiotic and prednisone Treating himself with 2 different aromatherapy herbal products which she credits for keeping him better. Rare use of albuterol rescue inhaler. Symbicort works much better with [employer]  and today he feels "fine". He still thinks particular areas of dust and dirt in his work are triggers but he cannot define what products he may be exposed to. He has not exercised in the last several weeks because of back discomfort. Radiology reviewed w/ him: CT maxillofacial 02/26/12-IMPRESSION:  Mild paranasal sinus disease most promptly affecting the frontal  sinuses and right maxillary sinus.  Original Report Authenticated By: Andreas Newport, M.D. Allergy Profile 02/23/2012: Total IgE 120.3, with some specific allergy antibodies but no significant elevations pointing to keep triggers. Peripheral eosinophils 6.8%  ROS-see HPI Constitutional:   No-   weight loss, night sweats, fevers, chills, fatigue, lassitude. HEENT:   No-  headaches, difficulty swallowing, tooth/dental problems, sore throat,       No-  sneezing, itching, ear ache,  + nasal congestion, post nasal drip,  CV:  No- anginal  chest pain, orthopnea, PND, swelling in lower extremities, anasarca, dizziness,  palpitations Resp: +  shortness of breath with exertion or at rest.              No-   productive cough,  + non-productive cough,  No- coughing up of blood.              No-   change in color of mucus.  No- wheezing.   Skin: No-   rash or lesions. GI:  No-   heartburn, indigestion, abdominal pain, nausea, vomiting,  GU:  MS:  No-   joint pain or swelling.   Neuro-     nothing unusual Psych:  No- change in mood or affect. No depression or anxiety.  No memory loss.  OBJ- Physical Exam General- Alert, Oriented, Affect-appropriate, Distress- none acute, fit- appearing Skin- rash-none, lesions- none, excoriation- none Lymphadenopathy- none Head- atraumatic            Eyes- Gross vision intact, PERRLA, conjunctivae and secretions clear            Ears- Hearing, canals-normal            Nose- + turbinate edema, no-Septal dev, mucus, polyps, erosion, perforation             Throat- Mallampati III , mucosa clear , drainage- none, tonsils- atrophic Neck- flexible , trachea midline, no stridor , thyroid nl, carotid no bruit Chest - symmetrical excursion , unlabored           Heart/CV- RRR , no murmur , no gallop  , no rub, nl s1 s2                           - JVD- none , edema- none, stasis changes- none, varices- none           Lung- no- wheeze, cough- none , dullness-none, rub- none. + Slow expiratory phase.           Chest wall-  Abd-  Br/ Gen/ Rectal- Not done, not indicated Extrem- cyanosis- none, clubbing, none, atrophy- none, strength- nl Neuro- grossly intact to observation

## 2012-04-27 NOTE — Assessment & Plan Note (Signed)
No recent recurrence. 

## 2012-04-27 NOTE — Assessment & Plan Note (Signed)
Plan-try Dymista

## 2012-05-03 ENCOUNTER — Ambulatory Visit (AMBULATORY_SURGERY_CENTER): Payer: 59

## 2012-05-03 ENCOUNTER — Encounter: Payer: Self-pay | Admitting: Gastroenterology

## 2012-05-03 VITALS — Ht 70.5 in | Wt 211.9 lb

## 2012-05-03 DIAGNOSIS — Z1211 Encounter for screening for malignant neoplasm of colon: Secondary | ICD-10-CM

## 2012-05-03 DIAGNOSIS — Z86010 Personal history of colonic polyps: Secondary | ICD-10-CM

## 2012-05-03 MED ORDER — MOVIPREP 100 G PO SOLR: ORAL | Status: DC

## 2012-05-17 ENCOUNTER — Encounter: Payer: Self-pay | Admitting: Gastroenterology

## 2012-05-17 ENCOUNTER — Ambulatory Visit (AMBULATORY_SURGERY_CENTER): Payer: 59 | Admitting: Gastroenterology

## 2012-05-17 VITALS — BP 145/96 | HR 66 | Temp 97.7°F | Resp 10 | Ht 70.5 in | Wt 211.0 lb

## 2012-05-17 DIAGNOSIS — K620 Anal polyp: Secondary | ICD-10-CM

## 2012-05-17 DIAGNOSIS — K621 Rectal polyp: Secondary | ICD-10-CM

## 2012-05-17 DIAGNOSIS — D126 Benign neoplasm of colon, unspecified: Secondary | ICD-10-CM

## 2012-05-17 DIAGNOSIS — Z1211 Encounter for screening for malignant neoplasm of colon: Secondary | ICD-10-CM

## 2012-05-17 DIAGNOSIS — Z86010 Personal history of colonic polyps: Secondary | ICD-10-CM

## 2012-05-17 MED ORDER — SODIUM CHLORIDE 0.9 % IV SOLN: 500.0000 mL | INTRAVENOUS | Status: DC

## 2012-05-17 NOTE — Patient Instructions (Signed)
YOU HAD AN ENDOSCOPIC PROCEDURE TODAY AT THE [facility]: Refer to the procedure report that was given to you for any specific questions about what was found during the examination.  If the procedure report does not answer your questions, please call your gastroenterologist to clarify.  If you requested that your care partner not be given the details of your procedure findings, then the procedure report has been included in a sealed envelope for you to review at your convenience later.  YOU SHOULD EXPECT: Some feelings of bloating in the abdomen. Passage of more gas than usual.  Walking can help get rid of the air that was put into your GI tract during the procedure and reduce the bloating. If you had a lower endoscopy (such as a colonoscopy or flexible sigmoidoscopy) you may notice spotting of blood in your stool or on the toilet paper. If you underwent a bowel prep for your procedure, then you may not have a normal bowel movement for a few days.  DIET: Your first meal following the procedure should be a light meal and then it is ok to progress to your normal diet.  A half-sandwich or bowl of soup is an example of a good first meal.  Heavy or fried foods are harder to digest and may make you feel nauseous or bloated.  Likewise meals heavy in dairy and vegetables can cause extra gas to form and this can also increase the bloating.  Drink plenty of fluids but you should avoid alcoholic beverages for 24 hours.  ACTIVITY: Your care partner should take you home directly after the procedure.  You should plan to take it easy, moving slowly for the rest of the day.  You can resume normal activity the day after the procedure however you should NOT DRIVE or use heavy machinery for 24 hours (because of the sedation medicines used during the test).    SYMPTOMS TO REPORT IMMEDIATELY: A gastroenterologist can be reached at any hour.  During normal business hours, 8:30 AM to 5:00 PM Monday through Friday,  call [phone].  After hours and on weekends, please call the GI answering service at [phone] who will take a message and have the physician on call contact you.   Following lower endoscopy (colonoscopy or flexible sigmoidoscopy):  Excessive amounts of blood in the stool  Significant tenderness or worsening of abdominal pains  Swelling of the abdomen that is new, acute  Fever of 100F or higher    FOLLOW UP: If any biopsies were taken you will be contacted by phone or by letter within the next 1-3 weeks.  Call your gastroenterologist if you have not heard about the biopsies in 3 weeks.  Our staff will call the home number listed on your records the next business day following your procedure to check on you and address any questions or concerns that you may have at that time regarding the information given to you following your procedure. This is a courtesy call and so if there is no answer at the home number and we have not heard from you through the emergency physician on call, we will assume that you have returned to your regular daily activities without incident.  SIGNATURES/CONFIDENTIALITY: You and/or your care partner have signed paperwork which will be entered into your electronic medical record.  These signatures attest to the fact that that the information above on your After Visit Summary has been reviewed and is understood.  Full responsibility of the confidentiality   of this discharge information lies with you and/or your care-partner.     

## 2012-05-17 NOTE — Op Note (Signed)
[facility] [address] [place], [address]   COLONOSCOPY PROCEDURE REPORT  PATIENT: Douglas [name], Douglas [name]  MR#: [id number] BIRTHDATE: 09/25/1960 , 51  yrs. old GENDER: Male ENDOSCOPIST: Rachael Fee, MD PROCEDURE DATE:  05/17/2012 PROCEDURE:   Colonoscopy with snare polypectomy ASA CLASS:   Class II INDICATIONS:2007 adenomatous colon polyps. MEDICATIONS: Fentanyl 75 mcg IV, Versed 8 mg IV, and These medications were titrated to patient response per physician's verbal order  DESCRIPTION OF PROCEDURE:   After the risks benefits and alternatives of the procedure were thoroughly explained, informed consent was obtained.  A digital rectal exam revealed no abnormalities of the rectum.   The LB CF-H180AL [id number]  endoscope was introduced through the anus and advanced to the cecum, which was identified by both the appendix and ileocecal valve. No adverse events experienced.   The quality of the prep was good.  The instrument was then slowly withdrawn as the colon was fully examined.    COLON FINDINGS: Two diminutive polyps were removed from rectum with cold snare, sent to pathology (jar 1).   The colon mucosa was otherwise normal.  Retroflexed views revealed no abnormalities. The scope was withdrawn and the procedure completed. COMPLICATIONS: There were no complications.  ENDOSCOPIC IMPRESSION: 1.  Two small polyps were removed and sent to pathology. 2.  The colon mucosa was otherwise normal  RECOMMENDATIONS: 1.  Given your personal history of adenomatous (pre-cancerous) polyps, you will need a repeat colonoscopy in 5 years even if the polyps removed today are NOT precancerous. 2.  You will receive a letter within 1-2 weeks with the results of your biopsy as well as final recommendations.  Please call my office if you have not received a letter after 3 weeks.   eSigned:  Rachael Fee, MD 05/17/2012 10:21 AM    cc: Santiago Bumpers, MD

## 2012-05-17 NOTE — Progress Notes (Signed)
Patient did not have preoperative order for IV antibiotic SSI prophylaxis. (G8918)  Patient did not experience any of the following events: a burn prior to discharge; a fall within the facility; wrong site/side/patient/procedure/implant event; or a hospital transfer or hospital admission upon discharge from the facility. (G8907)  

## 2012-05-18 ENCOUNTER — Telehealth: Payer: Self-pay | Admitting: *Deleted

## 2012-05-18 NOTE — Telephone Encounter (Signed)
  Follow up Call-  Call back number 05/17/2012  Post procedure Call Back phone  # [phone]  Permission to leave phone message Yes     Patient questions:  Do you have a fever, pain , or abdominal swelling? no Pain Score  0 *  Have you tolerated food without any problems? yes  Have you been able to return to your normal activities? yes  Do you have any questions about your discharge instructions: Diet   no Medications  no Follow up visit  no  Do you have questions or concerns about your Care? no  Actions: * If pain score is 4 or above: No action needed, pain <4.

## 2012-05-23 ENCOUNTER — Encounter: Payer: Self-pay | Admitting: Gastroenterology

## 2012-05-24 ENCOUNTER — Encounter: Payer: Self-pay | Admitting: Family Medicine

## 2012-06-14 ENCOUNTER — Ambulatory Visit (INDEPENDENT_AMBULATORY_CARE_PROVIDER_SITE_OTHER): Payer: 59

## 2012-06-14 DIAGNOSIS — Z23 Encounter for immunization: Secondary | ICD-10-CM

## 2012-08-08 ENCOUNTER — Ambulatory Visit (INDEPENDENT_AMBULATORY_CARE_PROVIDER_SITE_OTHER): Payer: 59 | Admitting: Family Medicine

## 2012-08-08 ENCOUNTER — Encounter: Payer: Self-pay | Admitting: Family Medicine

## 2012-08-08 VITALS — BP 149/83 | HR 76 | Temp 98.2°F | Ht 71.0 in | Wt 220.0 lb

## 2012-08-08 DIAGNOSIS — J449 Chronic obstructive pulmonary disease, unspecified: Secondary | ICD-10-CM

## 2012-08-08 DIAGNOSIS — F411 Generalized anxiety disorder: Secondary | ICD-10-CM

## 2012-08-08 MED ORDER — TESTOSTERONE 12.5 MG/ACT (1%) TD GEL: 5.0000 g | TRANSDERMAL | Status: DC | PRN

## 2012-08-08 MED ORDER — CITALOPRAM HYDROBROMIDE 20 MG PO TABS: 20.0000 mg | ORAL_TABLET | Freq: Every day | ORAL | Status: DC

## 2012-08-08 NOTE — Progress Notes (Signed)
OFFICE NOTE  08/08/2012  CC:  Chief Complaint  Patient presents with  . Follow-up    bronchitis     HPI: Patient is a 51 y.o. Caucasian male who is here for f/u bronchitis and recurrent allergic rhinitis/recurrent sinsitis. Says he is doing pretty well from a respiratory standpoint.  Deals with mostly chronic nasal congestion/rhinorrhea, but no facial pain and no purulent nasal mucous.  No ST, no ear pains/pressure. He is compliant with his symbicort and rarely has need for his albuterol inhaler.  He has seen Dr. Maple Hudson in Pulmonology and is pleased with his improvement and their current management.  He expresses concern about his anxiety/worry lately.  He says it started getting worse when he purchased his airline tickets for himself and his family for a trip to [place].  This trip will be in February but now he worries constantly b/c of his fear of flying.  He also worries about other more mundane things, seems to never turn off the mind, has some restlessness and keyed-up feeling, diminished concentration.  No significant depression at this point.  We have used citalopram for him in the past to successfully treat depression/anxiety when his wife and daughter were in [place] without him for a long duration of time.    Pertinent PMH:  Past Medical History  Diagnosis Date  . Depression     Prozac in the past not much help.  Spontaneously resolved.  . Adenomatous colon polyp 05/07/06    Repeat colonoscopy 05/17/2012 showed 2 small polyps that were removed--path showed Hyperplastic-not adenomatous.  Still needs repeat 5 yrs.  . Diverticulosis   . Hypogonadism male     Felt improved on andogel but testost levels did not improve.  . Rotator cuff tear 2011    [facility]: conservative mgmt--symptoms stable as of 04/2011.  . 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  . Insomnia   . Hypertriglyceridemia     fish oil OTC   . Angioedema of lips     saw allergist 11/2011    MEDS:  Outpatient Prescriptions Prior to Visit  Medication Sig Dispense Refill  . albuterol (VENTOLIN HFA) 108 (90 BASE) MCG/ACT inhaler Inhale 2 puffs into the lungs every 6 (six) hours as needed for wheezing.  1 Inhaler  0  . Azelastine-Fluticasone (DYMISTA) 137-50 MCG/ACT SUSP Place 1-2 puffs into the nose at bedtime.  1 Bottle  prn  . diphenhydrAMINE (BENADRYL) 25 MG tablet Take 25 mg by mouth every 6 (six) hours as needed.      Marland Kitchen EPINEPHrine (EPIPEN) 0.3 mg/0.3 mL DEVI Inject 0.3 mLs (0.3 mg total) into the muscle once.  1 Device  3  . Fexofenadine HCl (ALLEGRA PO) Take by mouth daily.      . montelukast (SINGULAIR) 10 MG tablet Take 1 tablet (10 mg total) by mouth at bedtime.  30 tablet  6  . tadalafil (CIALIS) 20 MG tablet Take 1 tablet (20 mg total) by mouth daily as needed.  10 tablet  5  . Testosterone 1.25 GM/ACT (1%) GEL Place 5 g onto the skin as needed.      Marland Kitchen UNABLE TO FIND Med Name: Doterra Massage blend oil, massage on neck  2-3 times a day      . [DISCONTINUED] budesonide-formoterol (SYMBICORT) 160-4.5 MCG/ACT inhaler Inhale 2 puffs into the lungs 2 (two) times daily.  1 Inhaler  5  . traZODone (  DESYREL) 50 MG tablet 1 tab po qhs prn insomnia  30 tablet  1  . [DISCONTINUED] UNABLE TO FIND 2 drops 2 (two) times daily. Med Name: Samara Deist Protective blend oil       Last reviewed on 08/08/2012  3:40 PM by Jeoffrey Massed, MD  PE: Blood pressure 149/83, pulse 76, temperature 98.2 F (36.8 C), temperature source Temporal, height 5\' 11"  (1.803 m), weight 220 lb (99.791 kg). VS: noted--normal. Gen: alert, NAD, WELL APPEARING.  Affect: calm, pleasant. HEENT: eyes without injection, drainage, or swelling.  Ears: EACs clear, TMs with normal light reflex and landmarks.  Nose: Clear rhinorrhea, with some dried, crusty exudate adherent to mildly injected mucosa.  No purulent d/c.  No paranasal sinus TTP.  No facial swelling.  Throat  and mouth without focal lesion.  No pharyngial swelling, erythema, or exudate.   Neck: supple, no LAD.   LUNGS: CTA bilat, nonlabored resps.   CV: RRR, no m/r/g. EXT: no c/c/e SKIN: no rash    IMPRESSION AND PLAN:  Asthmatic bronchitis , chronic Stable.  Continue nasal sprays, symbicort, prn albuterol.  Generalized anxiety disorder With some specific phobias as well (flying, heights). May continue prn xanax for his situational phobias, and he is in favor of starting a daily SSRI today as well. Start citalopram 20mg  qd.  Therapeutic expectations and side effect profile of medication discussed today.  Patient's questions answered.   Hypogonadism:  He plans on switching to testosterone injection for one-time use just prior to going on his trip to [place]--he does not want to risk taking the gel to the airport and getting it taken away by security prior to getting on the plane.  An After Visit Summary was printed and given to the patient.  FOLLOW UP: 2 mo

## 2012-08-11 DIAGNOSIS — F411 Generalized anxiety disorder: Secondary | ICD-10-CM

## 2012-08-11 HISTORY — DX: Generalized anxiety disorder: F41.1

## 2012-08-11 NOTE — Assessment & Plan Note (Signed)
With some specific phobias as well (flying, heights). May continue prn xanax for his situational phobias, and he is in favor of starting a daily SSRI today as well. Start citalopram 20mg  qd.  Therapeutic expectations and side effect profile of medication discussed today.  Patient's questions answered.

## 2012-08-11 NOTE — Assessment & Plan Note (Signed)
Stable.  Continue nasal sprays, symbicort, prn albuterol.

## 2012-09-05 ENCOUNTER — Encounter: Payer: Self-pay | Admitting: Internal Medicine

## 2012-09-05 ENCOUNTER — Ambulatory Visit (INDEPENDENT_AMBULATORY_CARE_PROVIDER_SITE_OTHER): Payer: 59 | Admitting: Internal Medicine

## 2012-09-05 VITALS — BP 118/80 | HR 74 | Ht 71.0 in | Wt 223.0 lb

## 2012-09-05 DIAGNOSIS — J45909 Unspecified asthma, uncomplicated: Secondary | ICD-10-CM

## 2012-09-05 DIAGNOSIS — J449 Chronic obstructive pulmonary disease, unspecified: Secondary | ICD-10-CM

## 2012-09-05 MED ORDER — ACLIDINIUM BROMIDE 400 MCG/ACT IN AEPB: 1.0000 | INHALATION_SPRAY | Freq: Two times a day (BID) | RESPIRATORY_TRACT | Status: DC

## 2012-09-05 NOTE — Patient Instructions (Addendum)
Order- schedule PFT   Dx asthma with bronchitis  Sample Tudorza inhaler  To add to your current meds      1 puff twice daily  Your primary physician may want to talk with you about getting a cardiac exercise stress test if you notice a heavy feeling on your chest with exertion.

## 2012-09-05 NOTE — Progress Notes (Signed)
02/23/12- 50 yoM never smoker seen for his complaint of "bronchitis/cough". He is self-referred. PCP Dr Milinda Cave [facility]. He denies history of seasonal allergic rhinitis, asthma or lung disease. His problem began with an apparent cold in April. 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 He sometimes wears a mask. By May he said he had sinus congestion "like cement" then onset of cough and chest congestion. He was diagnosed with bronchitis and had chest x-ray. He took a few days off of work with little difference but then had a few days of vacation in [place] where he did better. On return, he again developed sinus congestion. Was treated initially with a Z-Pak, then Singulair and prednisone tapers twice. He then began daily Allegra which seemed to help his sinuses some. He has had persistent chest tightness with variable dry cough and wheeze. He never had fever, purulent discharge or headache. He denies pressure discomfort in his ears, GI upset, rash or adenopathy.  He had allergy skin testing twice at [facility] practice at [place], which is not part of this practice. We got a copy of skin test record from 12/21/2011 showing moderate intradermal reactions to a wide variety of common inhalant allergens. He says the skin testing was done for evaluation of a separate complaint of angioedema of the lips. This  happened twice at work. He was treated with antihistamines, including his Allegra, with no recurrence.  He has no history of ENT surgery. He describes his home as clean and dry with a brand-new HVAC and no mold. His family is well.  CXR- 01/18/12-  CHEST - 2 VIEW  Comparison: None  Findings: The cardiac silhouette, mediastinal and hilar contours  are within normal limits. There are mild bronchitic type  interstitial lung changes and streaky basilar atelectasis which may  suggest bronchitis or  reactive airways disease. No findings for  focal infiltrate, edema or effusion. The bony thorax is intact.  IMPRESSION:  Mild bronchitic type lung changes but no infiltrates or effusions.  Original Report Authenticated By: P. Loralie Champagne, M.D.   04/20/12- 35 yoM never smoker followed for asthmatic bronchitis, rhinisinusitis,  complicated by hx angioedema, + allergy stests,kin  Patient states has had bronchitis twice since last visit. States antibiotics help when he is on them but bronchitis comes back when he finishes them. Has started taking a "protective blend oil" and it has helped. States is still having sinus congestion.  Wheezing started again 2 weeks ago,  one week after end of last course of antibiotic and prednisone Treating himself with 2 different aromatherapy herbal products which she credits for keeping him better. Rare use of albuterol rescue inhaler. Symbicort works much better with [employer]  and today he feels "fine". He still thinks particular areas of dust and dirt in his work are triggers but he cannot define what products he may be exposed to. He has not exercised in the last several weeks because of back discomfort. Radiology reviewed w/ him: CT maxillofacial 02/26/12-IMPRESSION:  Mild paranasal sinus disease most promptly affecting the frontal  sinuses and right maxillary sinus.  Original Report Authenticated By: Andreas Newport, M.D. Allergy Profile 02/23/2012: Total IgE 120.3, with some specific allergy antibodies but no significant elevations pointing to keep triggers. Peripheral eosinophils 6.8%  09/05/12- 51 yoM never smoker followed for asthmatic bronchitis, rhinisinusitis,  complicated by hx angioedema, + allergy skin tests, FOLLOWS FOR:  coughing has decreased. while exerting himself he "bottoms out" and feels like he has something sitting on his chest. He has a Systems analyst at [employer]. During cardio exercises he will wheeze. Pre-treating without but overall  helps a little. His primary physician did EKG and Exam of his heart. He is now wearing a paper pressurized hood at work to filter his breathing air. Continues Symbicort twice daily. Last chest x-ray 01/18/2012 showed bronchitis changes  ROS-see HPI Constitutional:   No-   weight loss, night sweats, fevers, chills, fatigue, lassitude. HEENT:   No-  headaches, difficulty swallowing, tooth/dental problems, sore throat,       No-  sneezing, itching, ear ache,  + nasal congestion, post nasal drip,  CV:  No- anginal  chest pain, orthopnea, PND, swelling in lower extremities, anasarca, dizziness, palpitations Resp: +  shortness of breath with exertion or at rest.              No-   productive cough,  no- non-productive cough,  No- coughing up of blood.              No-   change in color of mucus.  + wheezing.   Skin: No-   rash or lesions. GI:  No-   heartburn, indigestion, abdominal pain, nausea, vomiting,  GU:  MS:  No-   joint pain or swelling.   Neuro-     nothing unusual Psych:  No- change in mood or affect. No depression or anxiety.  No memory loss.  OBJ- Physical Exam General- Alert, Oriented, Affect-appropriate, Distress- none acute, fit- appearing Skin- rash-none, lesions- none, excoriation- none Lymphadenopathy- none Head- atraumatic            Eyes- Gross vision intact, PERRLA, conjunctivae and secretions clear            Ears- Hearing, canals-normal            Nose- clear, no-Septal dev, mucus, polyps, erosion, perforation             Throat- Mallampati III , mucosa clear , drainage- none, tonsils- atrophic Neck- flexible , trachea midline, no stridor , thyroid nl, carotid no bruit Chest - symmetrical excursion , unlabored           Heart/CV- RRR , no murmur , no gallop  , no rub, nl s1 s2                           - JVD- none , edema- none, stasis changes- none, varices- none           Lung- no- wheeze, cough- none , dullness-none, rub- none. + Slow expiratory phase.            Chest wall-  Abd-  Br/ Gen/ Rectal- Not done, not indicated Extrem- cyanosis- none, clubbing, none, atrophy- none, strength- nl Neuro- grossly intact to observation

## 2012-09-14 NOTE — Assessment & Plan Note (Signed)
Better control. These at least occasional exercise-induced asthma pattern. He is using effective air- filtration system currently at work. Plan-he understands to follow up with his primary physician for anginal type chest pain, palpitation or similar concerns. Schedule PFT. Try a sample Tudorza inhaler for impact on dyspnea.

## 2012-09-26 ENCOUNTER — Ambulatory Visit (INDEPENDENT_AMBULATORY_CARE_PROVIDER_SITE_OTHER): Payer: 59 | Admitting: Internal Medicine

## 2012-09-26 DIAGNOSIS — J45909 Unspecified asthma, uncomplicated: Secondary | ICD-10-CM

## 2012-09-26 NOTE — Progress Notes (Signed)
PFT done today. 

## 2012-09-28 ENCOUNTER — Encounter: Payer: Self-pay | Admitting: Family Medicine

## 2012-09-28 ENCOUNTER — Ambulatory Visit (INDEPENDENT_AMBULATORY_CARE_PROVIDER_SITE_OTHER): Payer: 59 | Admitting: Family Medicine

## 2012-09-28 VITALS — BP 130/92 | HR 85 | Ht 71.0 in | Wt 216.0 lb

## 2012-09-28 DIAGNOSIS — J449 Chronic obstructive pulmonary disease, unspecified: Secondary | ICD-10-CM

## 2012-09-28 DIAGNOSIS — F40298 Other specified phobia: Secondary | ICD-10-CM

## 2012-09-28 DIAGNOSIS — J4489 Other specified chronic obstructive pulmonary disease: Secondary | ICD-10-CM

## 2012-09-28 DIAGNOSIS — E291 Testicular hypofunction: Secondary | ICD-10-CM

## 2012-09-28 DIAGNOSIS — K644 Residual hemorrhoidal skin tags: Secondary | ICD-10-CM | POA: Insufficient documentation

## 2012-09-28 DIAGNOSIS — F40241 Acrophobia: Secondary | ICD-10-CM

## 2012-09-28 MED ORDER — ALPRAZOLAM 1 MG PO TABS: ORAL_TABLET | ORAL | Status: DC

## 2012-09-28 MED ORDER — HYDROCORTISONE ACE-PRAMOXINE 2.5-1 % RE CREA: TOPICAL_CREAM | RECTAL | Status: DC

## 2012-09-28 MED ORDER — FEXOFENADINE-PSEUDOEPHED ER 180-240 MG PO TB24: 1.0000 | ORAL_TABLET | Freq: Every day | ORAL | Status: DC

## 2012-09-28 NOTE — Assessment & Plan Note (Signed)
Sx's spont resolving. Analpram 1%/2.5% rectal cream rx'd today for future use as per pt's request.

## 2012-09-28 NOTE — Assessment & Plan Note (Signed)
Xanax 1mg , 1 bid prn, #30, RF x 1 (for LOTS of airplane flights coming up, as noted in HPI). Therapeutic expectations and side effect profile of medication discussed today.  Patient's questions answered.

## 2012-09-28 NOTE — Assessment & Plan Note (Signed)
Doing well with current measures at work + medical regimen as stated in current med list. Continue this.

## 2012-09-28 NOTE — Assessment & Plan Note (Signed)
Symptomatically improved on current regimen, although testost has never come up into normal range. Reviewed hx: Several time has been <100 (saw [facility] in 2011 for this, w/u for secondary hypogonadism was unremarkable), most recent testosterone check via [facility] 10/2010 was 1.15 ng/ml. Last Hb 01/2012 was normal. Continue current regimen.

## 2012-09-28 NOTE — Progress Notes (Signed)
OFFICE NOTE  09/28/2012  CC:  Chief Complaint  Patient presents with  . Follow-up    GAD, hypogonadism, rhinitis, bronchitis     HPI: Patient is a 52 y.o. Caucasian male who is here for f/u anxiety, chronic rhinitis with asthma. He feels like all is stable from a respiratory standpoint.  Says pulm's recent addition of tudorza has been particularly helpful. Recently has added daily decongestant to help with eust tube dysfxn and this helps, needs rx for allegra D--says continuing this on his upcoming flights to [place] usually helps a lot.  Is traveling to [place] soon and needs xanax for his fear of flying/heights.  Usually needs 1-2 of the 1mg  tabs for each flight and has lots of connections--29 hours of flying each way.  Last 2 wks ext hemorrhoids bothering him and actually are resolving the last few days but he wants the med that Dr. Christella Hartigan had rx'd in the past for him b/c it helped much better than OTC meds he has been trying.  He had some mild BRBPR with this but this has resolved.  No constipation, just says that he had a dietary shift prior to onset of the flare up--his wife was gone for a couple of weeks and he was eating out a lot.  He says this consistently is the pattern, and when his wife is home and cooking Thai-type foods he has no problems with hemorrhoids.  Pertinent PMH:  Past Medical History  Diagnosis Date  . Depression     Prozac in the past not much help.  Spontaneously resolved.  . Adenomatous colon polyp 05/07/06    Repeat colonoscopy 05/17/2012 showed 2 small polyps that were removed--path showed Hyperplastic-not adenomatous.  Still needs repeat 5 yrs.  . Diverticulosis   . Hypogonadism male     Felt improved on andogel but testost levels did not improve.  . Rotator cuff tear 2011    [facility]: conservative mgmt--symptoms stable as of 04/2011.  . 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    . Insomnia   . Hypertriglyceridemia     fish oil OTC  . Angioedema of lips     saw allergist 11/2011  . Hemorrhoids     ext and int    MEDS:  Outpatient Prescriptions Prior to Visit  Medication Sig Dispense Refill  . Aclidinium Bromide (TUDORZA PRESSAIR) 400 MCG/ACT AEPB Inhale 1 puff into the lungs 2 (two) times daily.  1 each  0  . albuterol (VENTOLIN HFA) 108 (90 BASE) MCG/ACT inhaler Inhale 2 puffs into the lungs every 6 (six) hours as needed for wheezing.  1 Inhaler  0  . Azelastine-Fluticasone (DYMISTA) 137-50 MCG/ACT SUSP Place 1-2 puffs into the nose at bedtime.  1 Bottle  prn  . budesonide-formoterol (SYMBICORT) 160-4.5 MCG/ACT inhaler Inhale 1 puff into the lungs 2 (two) times daily.      . diphenhydrAMINE (BENADRYL) 25 MG tablet Take 25 mg by mouth every 6 (six) hours as needed.      Marland Kitchen EPINEPHrine (EPIPEN) 0.3 mg/0.3 mL DEVI Inject 0.3 mLs (0.3 mg total) into the muscle once.  1 Device  3  . Fexofenadine HCl (ALLEGRA PO) Take by mouth daily.      . montelukast (SINGULAIR) 10 MG tablet Take 1 tablet (10 mg total) by mouth at bedtime.  30 tablet  6  . Testosterone 12.5 MG/ACT (1%) GEL Place 5 g  onto the skin as needed.  75 g  5  . UNABLE TO FIND Med Name: Doterra Massage blend oil, massage on neck  2-3 times a day      . tadalafil (CIALIS) 20 MG tablet Take 1 tablet (20 mg total) by mouth daily as needed.  10 tablet  5  . traZODone (DESYREL) 50 MG tablet 1 tab po qhs prn insomnia  30 tablet  1   Last reviewed on 09/28/2012 10:44 AM by Jeoffrey Massed, MD  PE: Blood pressure 130/92, pulse 85, height 5\' 11"  (1.803 m), weight 216 lb (97.977 kg), SpO2 97.00%. Gen: Alert, well appearing.  Patient is oriented to person, place, time, and situation. AFFECT: pleasant, lucid thought and speech. No further exam today.  IMPRESSION AND PLAN:  Asthmatic bronchitis , chronic Doing well with current measures at work + medical regimen as stated in current med list. Continue this.     External hemorrhoids Sx's spont resolving. Analpram 1%/2.5% rectal cream rx'd today for future use as per pt's request.  Acrophobia Xanax 1mg , 1 bid prn, #30, RF x 1 (for LOTS of airplane flights coming up, as noted in HPI). Therapeutic expectations and side effect profile of medication discussed today.  Patient's questions answered.   Hypogonadism, male Symptomatically improved on current regimen, although testost has never come up into normal range. Reviewed hx: Several time has been <100 (saw [facility] in 2011 for this, w/u for secondary hypogonadism was unremarkable), most recent testosterone check via [facility] 10/2010 was 1.15 ng/ml. Last Hb 01/2012 was normal. Continue current regimen.  An After Visit Summary was printed and given to the patient.  FOLLOW UP: 57mo

## 2012-12-02 ENCOUNTER — Encounter: Payer: Self-pay | Admitting: Family Medicine

## 2012-12-02 ENCOUNTER — Other Ambulatory Visit: Payer: Self-pay | Admitting: *Deleted

## 2012-12-02 ENCOUNTER — Ambulatory Visit (INDEPENDENT_AMBULATORY_CARE_PROVIDER_SITE_OTHER): Payer: 59 | Admitting: Family Medicine

## 2012-12-02 VITALS — BP 146/92 | HR 76 | Ht 71.0 in | Wt 222.0 lb

## 2012-12-02 DIAGNOSIS — E291 Testicular hypofunction: Secondary | ICD-10-CM

## 2012-12-02 DIAGNOSIS — N529 Male erectile dysfunction, unspecified: Secondary | ICD-10-CM

## 2012-12-02 MED ORDER — TADALAFIL 20 MG PO TABS: 20.0000 mg | ORAL_TABLET | Freq: Every day | ORAL | Status: DC | PRN

## 2012-12-02 MED ORDER — TESTOSTERONE 10 MG/ACT (2%) TD GEL: 40.0000 mg | Freq: Every day | TRANSDERMAL | Status: DC

## 2012-12-02 NOTE — Assessment & Plan Note (Addendum)
We're going to start from scratch again. Stop testost.  Check testost, prolactin, TSH, LH/FSH, cortisol in 7-10d. He may then restart testost---formulation changed to fortesta today for insurance reasons: 2 pumps each thigh once daily. We'll then recheck testost after he's been back on it about 1 mo --making sure to time his blood draw for 2-3 hours after that day's administration. He has proven to respond symptomatically to testosterone (mood swings resolve, ED is improved (although he still requires cialis--rf given today)). Next o/v 4 mo.

## 2012-12-02 NOTE — Progress Notes (Signed)
OFFICE NOTE  12/02/2012  CC:  Chief Complaint  Patient presents with  . Elbow Pain    radiates to wrist and shoulder     HPI: Patient is a 52 y.o. Caucasian male who is here for questions about ongoing erectile dysfunction. Cannot get erection unless he is taking both testosterone supplement and cialis.  He was off of testost for about 5 wks recently when he visited [place].  During this time he tried cialis but was unable to get an erection.  He has now been back on his testost (androgel 1%) 5 pumps qd for about 1 wk.  Other sx's of hypogonadism: chronic poor energy level and mood swings.    Pertinent PMH:  Past Medical History  Diagnosis Date  . Depression     Prozac in the past not much help.  Spontaneously resolved.  . Adenomatous colon polyp 05/07/06    Repeat colonoscopy 05/17/2012 showed 2 small polyps that were removed--path showed Hyperplastic-not adenomatous.  Still needs repeat 5 yrs.  . Diverticulosis   . Hypogonadism male     Felt improved on andogel but testost levels did not improve.  . Rotator cuff tear 2011    [facility]: conservative mgmt--symptoms stable as of 04/2011.  . 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  . Insomnia   . Hypertriglyceridemia     fish oil OTC  . Angioedema of lips     saw allergist 11/2011  . Hemorrhoids     ext and int  . Generalized anxiety disorder 08/11/2012  . Chronic recurrent sinusitis 02/28/2012  . Asthmatic bronchitis , chronic 01/11/2012    Question occupational asthma with no specific agents identified. He does notice that some areas of his job are particularly dusty and dirty. Currently he is well controlled using Symbicort through an [employer] and rarely needing his rescue inhaler. This would be an acceptable long-term status. Elevated nonspecific total IgE and peripheral eosinophilia to suggest an atopic trigger with sensitization to something Consider  Daliresp for future use if needed   . Hypogonadism, male 04/08/2011    Several time has been <100 (saw [facility] in 2011 for this, w/u for secondary hypogonadism was unremarkable), most recent testosterone check via [facility] 10/2010 was 1.15 ng/ml.    Past surgical, social, and family history reviewed and no changes noted since last office visit.  MEDS:  Outpatient Prescriptions Prior to Visit  Medication Sig Dispense Refill  . Aclidinium Bromide (TUDORZA PRESSAIR) 400 MCG/ACT AEPB Inhale 1 puff into the lungs 2 (two) times daily.  1 each  0  . albuterol (VENTOLIN HFA) 108 (90 BASE) MCG/ACT inhaler Inhale 2 puffs into the lungs every 6 (six) hours as needed for wheezing.  1 Inhaler  0  . Azelastine-Fluticasone (DYMISTA) 137-50 MCG/ACT SUSP Place 1-2 puffs into the nose at bedtime.  1 Bottle  prn  . budesonide-formoterol (SYMBICORT) 160-4.5 MCG/ACT inhaler Inhale 1 puff into the lungs 2 (two) times daily.      . diphenhydrAMINE (BENADRYL) 25 MG tablet Take 25 mg by mouth every 6 (six) hours as needed.      Marland Kitchen EPINEPHrine (EPIPEN) 0.3 mg/0.3 mL DEVI Inject 0.3 mLs (0.3 mg total) into the muscle once.  1 Device  3  . fexofenadine-pseudoephedrine (ALLEGRA-D 24 HOUR) 180-240 MG per 24 hr tablet Take 1 tablet by mouth daily.  90 tablet  1  . hydrocortisone-pramoxine (ANALPRAM-HC) 2.5-1 %  rectal cream Apply rectally tid prn for hemorrhoids  30 g  3  . montelukast (SINGULAIR) 10 MG tablet Take 1 tablet (10 mg total) by mouth at bedtime.  30 tablet  6  . tadalafil (CIALIS) 20 MG tablet Take 1 tablet (20 mg total) by mouth daily as needed.  10 tablet  5  . Testosterone 12.5 MG/ACT (1%) GEL Place 5 g onto the skin as needed.  75 g  5  . UNABLE TO FIND Med Name: Doterra Massage blend oil, massage on neck  2-3 times a day      . ALPRAZolam (XANAX) 1 MG tablet 1 tab po q8h prn anxiety  30 tablet  1  . Fexofenadine HCl (ALLEGRA PO) Take by mouth daily.      . traZODone (DESYREL) 50 MG tablet 1 tab po qhs  prn insomnia  30 tablet  1   No facility-administered medications prior to visit.    PE: Blood pressure 146/92, pulse 76, height 5\' 11"  (1.803 m), weight 222 lb (100.699 kg). Gen: Alert, well appearing.  Patient is oriented to person, place, time, and situation. AFFECT: pleasant, lucid thought and speech. No further exam today.  IMPRESSION AND PLAN:  Hypogonadism, male Verlon Au going to start from scratch again. Stop testost.  Check testost, prolactin, TSH, LH/FSH, cortisol in 7-10d. He may then restart testost---formulation changed to fortesta today for insurance reasons: 2 pumps each thigh once daily. We'll then recheck testost after he's been back on it about 1 mo --making sure to time his blood draw for 2-3 hours after that day's administration. He has proven to respond symptomatically to testosterone (mood swings resolve, ED is improved (although he still requires cialis)). Next o/v 4 mo.   Lateral epicondylitis, right arm:  He has a hx of this, previously dx'd here.  He now requests a tennis elbow strap. I recommended he stop by a pharmacy and pick up one of these OTC.  An After Visit Summary was printed and given to the patient.  FOLLOW UP: 63mo

## 2012-12-04 ENCOUNTER — Encounter: Payer: Self-pay | Admitting: Family Medicine

## 2012-12-04 IMAGING — CT CT PARANASAL SINUSES LIMITED
1 of 2 series · 16 of 19 positions shown, 20 images · non-contrast
Comparison: 10/08/2010.

CLINICAL DATA: Acute sinusitis.

CT LIMITED SINUSES WITHOUT CONTRAST
TECHNIQUE: Multidetector CT images of the paranasal sinuses were
obtained in a single plane without contrast.

[Series 4: ltd sinus 3.0 h30s · axial · 0.37mm/px · z∈[-85,+12]mm · 16 of 18 slices shown, 20 images]
[im 2/18  brain]
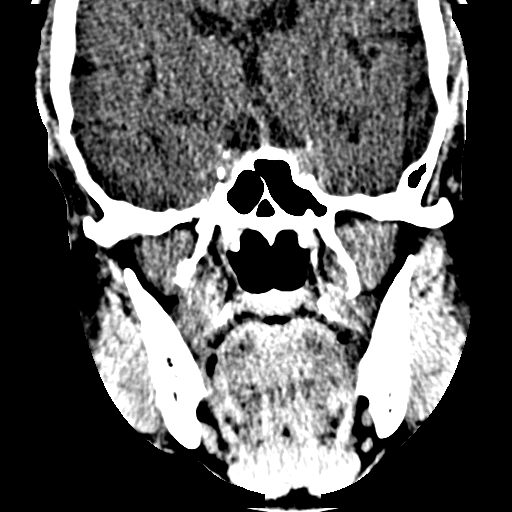
[im 2/18  bone]
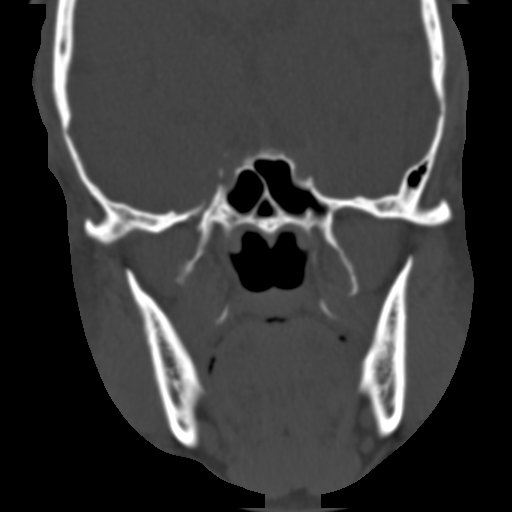
[im 3/18  bone]
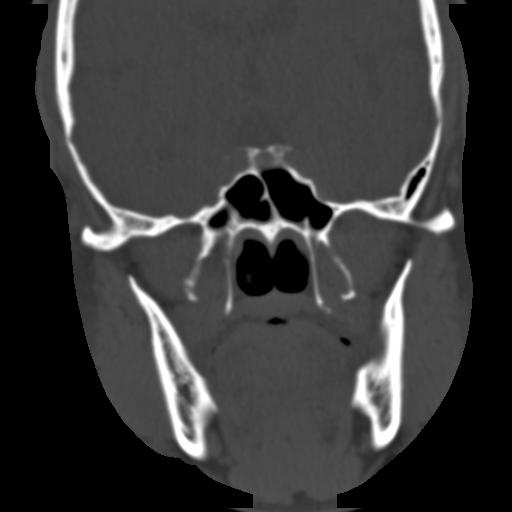
[im 4/18  bone]
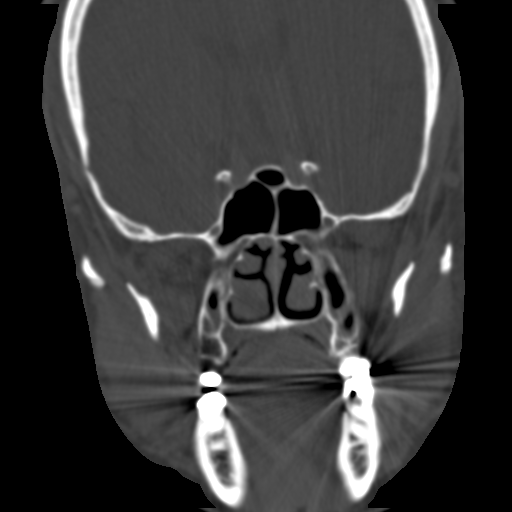
[im 5/18  bone]
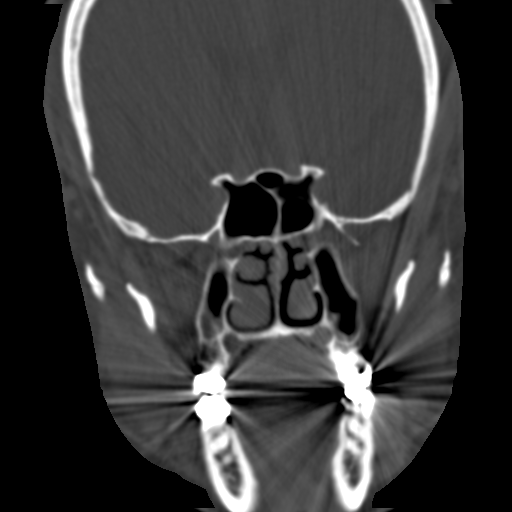
[im 6/18  brain]
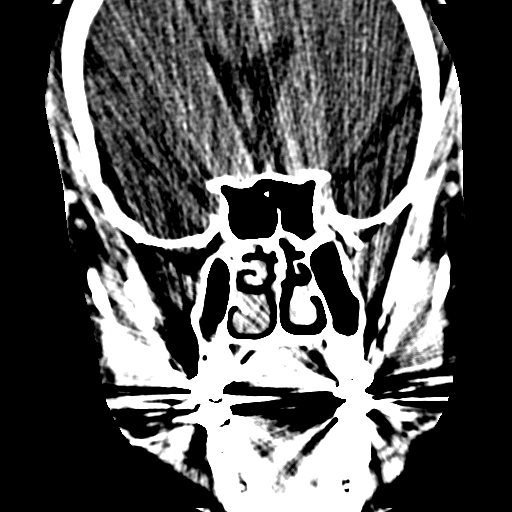
[im 6/18  bone]
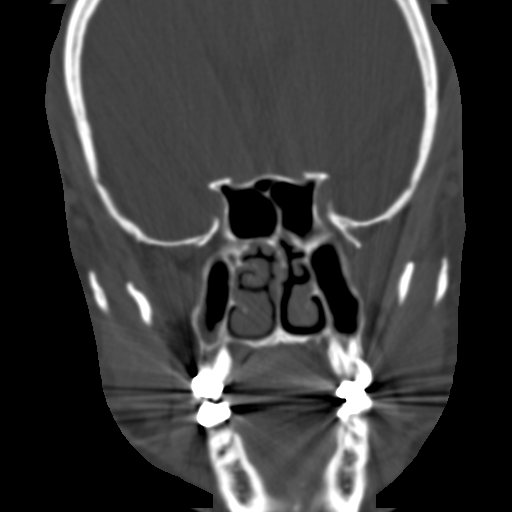
[im 7/18  bone]
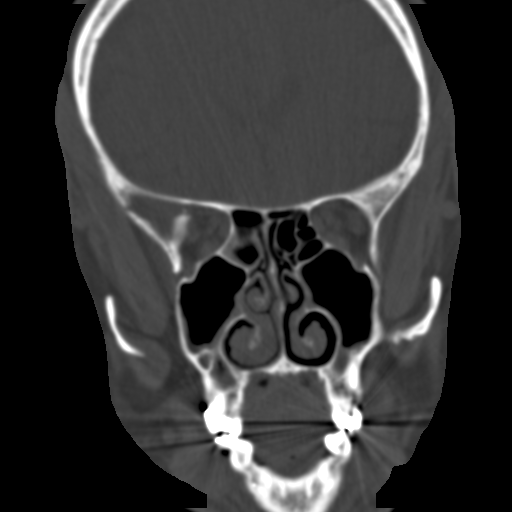
[im 8/18  bone]
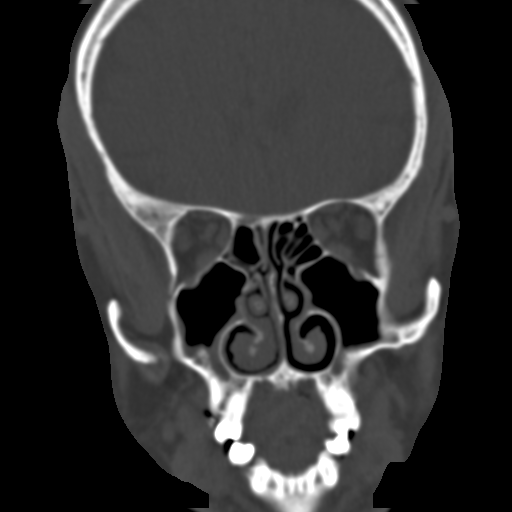
[im 9/18  bone]
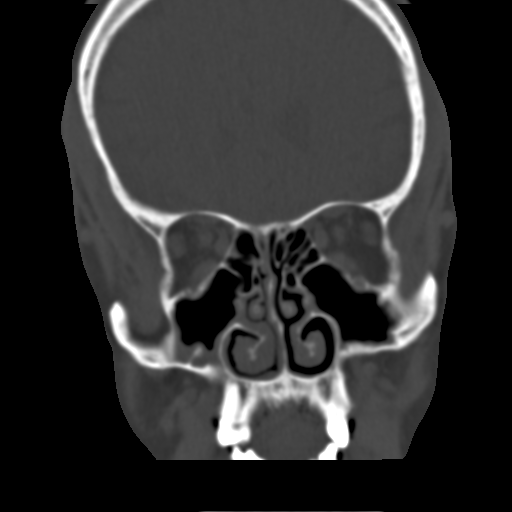
[im 10/18  brain]
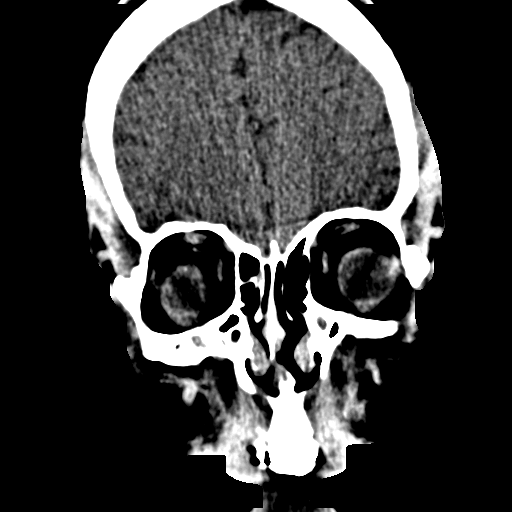
[im 10/18  bone]
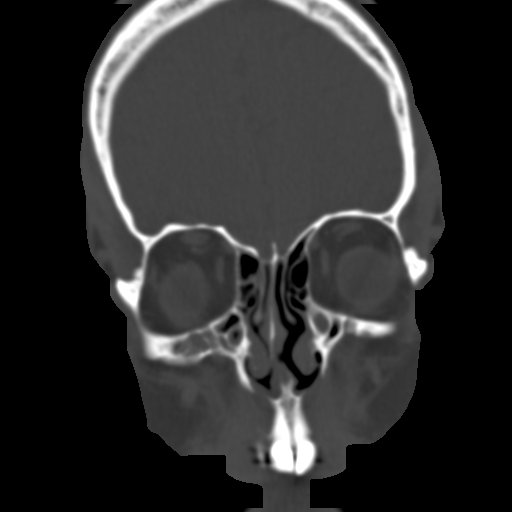
[im 11/18  bone]
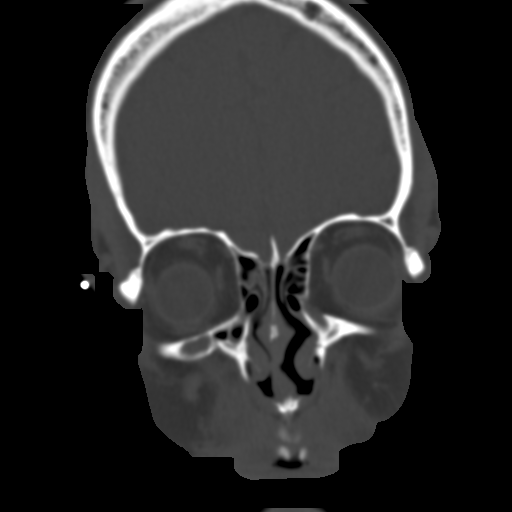
[im 12/18  bone]
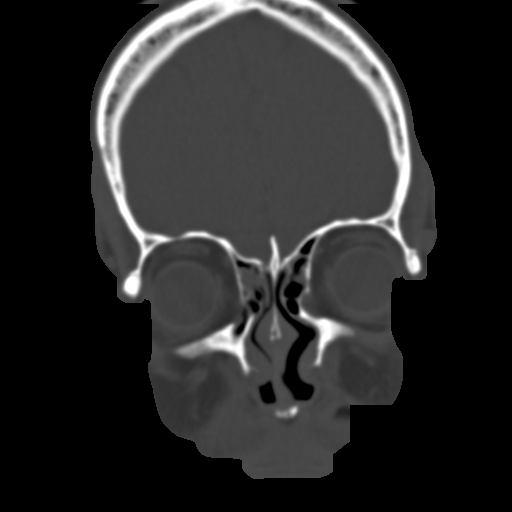
[im 13/18  bone]
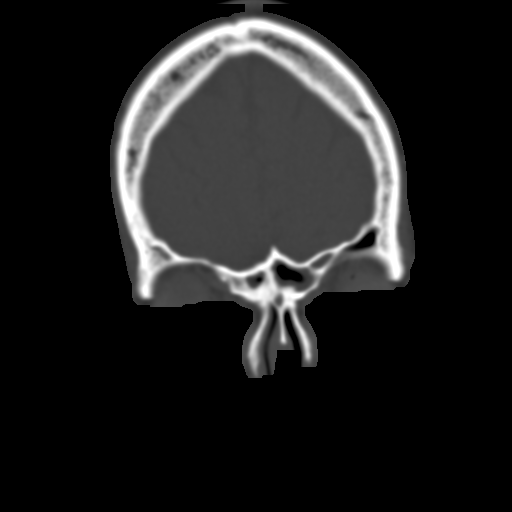
[im 14/18  brain]
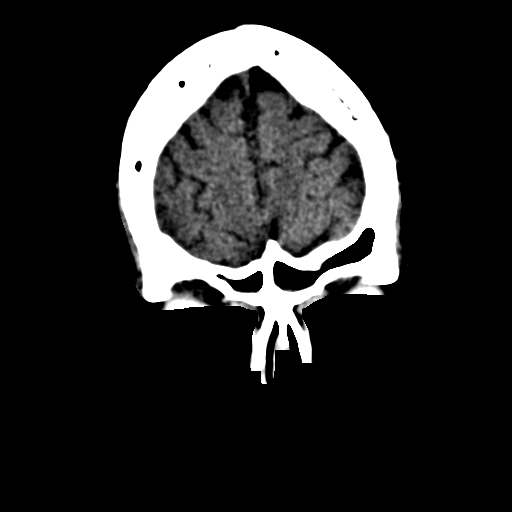
[im 14/18  bone]
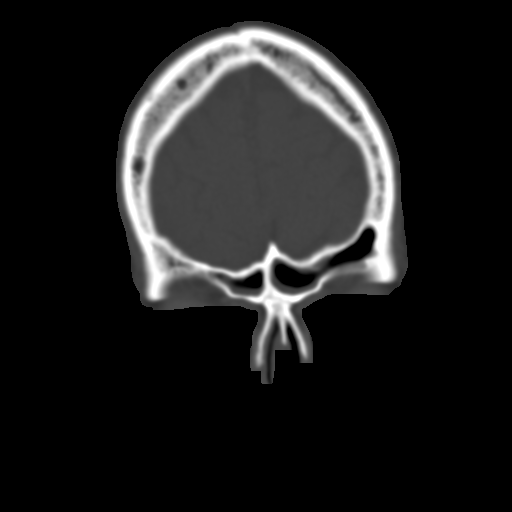
[im 15/18  bone]
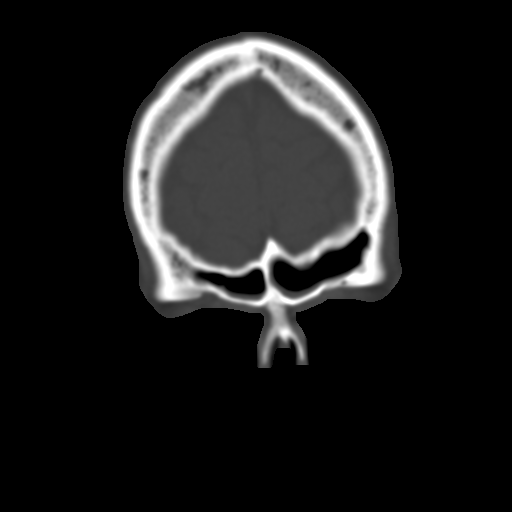
[im 16/18  bone]
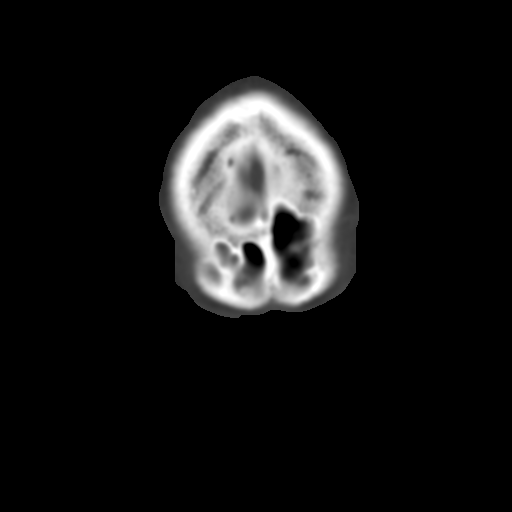
[im 17/18  bone]
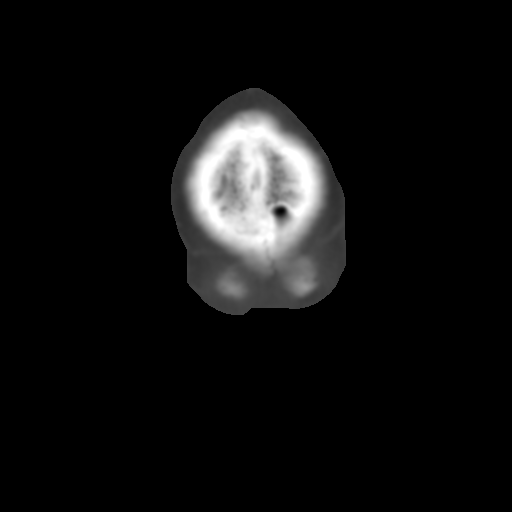

[16 of 19 positions shown; findings below may reference images not displayed]

FINDINGS: There is mild mucosal thickening in the floor of the
sphenoid sinuses bilaterally.  Mild right greater than left
maxillary mucosal thickening is present.  Leftward deviation of the
nasal septum.  Mild mucosal thickening is present in the ethmoid
sinuses.  Mild bilateral frontal sinus mucosal thickening.  There
is no complete opacification of any of the paranasal sinuses.  Most
prominent sinus disease is in the right maxillary and both frontal
sinuses.  Grossly, intracranial contents are within normal limits.
No fluid levels are present to suggest acute sinusitis.
IMPRESSION: Mild paranasal sinus disease most promptly affecting the frontal
sinuses and right maxillary sinus.

## 2012-12-08 ENCOUNTER — Other Ambulatory Visit (INDEPENDENT_AMBULATORY_CARE_PROVIDER_SITE_OTHER): Payer: 59

## 2012-12-08 DIAGNOSIS — E291 Testicular hypofunction: Secondary | ICD-10-CM

## 2012-12-08 NOTE — Progress Notes (Signed)
Labs only

## 2013-01-09 ENCOUNTER — Other Ambulatory Visit: Payer: Self-pay | Admitting: *Deleted

## 2013-01-09 MED ORDER — MONTELUKAST SODIUM 10 MG PO TABS: 10.0000 mg | ORAL_TABLET | Freq: Every day | ORAL | Status: DC

## 2013-01-09 NOTE — Telephone Encounter (Signed)
Singulair request [Last Rx 06.14.13 #[id number], Last OV 04.04.14], f/u due 07.2014 Rx request to pharmacy/SLS

## 2013-03-13 ENCOUNTER — Ambulatory Visit (INDEPENDENT_AMBULATORY_CARE_PROVIDER_SITE_OTHER): Payer: 59 | Admitting: Internal Medicine

## 2013-03-13 ENCOUNTER — Encounter: Payer: Self-pay | Admitting: Internal Medicine

## 2013-03-13 ENCOUNTER — Ambulatory Visit (INDEPENDENT_AMBULATORY_CARE_PROVIDER_SITE_OTHER)
Admission: RE | Admit: 2013-03-13 | Discharge: 2013-03-13 | Disposition: A | Discharge status: Home / Self Care | Payer: 59 | Source: Ambulatory Visit | Attending: Internal Medicine | Admitting: Internal Medicine

## 2013-03-13 VITALS — BP 120/70 | HR 76 | Ht 69.5 in | Wt 218.6 lb

## 2013-03-13 DIAGNOSIS — J42 Unspecified chronic bronchitis: Secondary | ICD-10-CM

## 2013-03-13 DIAGNOSIS — T783XXA Angioneurotic edema, initial encounter: Secondary | ICD-10-CM | POA: Insufficient documentation

## 2013-03-13 DIAGNOSIS — R0600 Dyspnea, unspecified: Secondary | ICD-10-CM

## 2013-03-13 DIAGNOSIS — R0989 Other specified symptoms and signs involving the circulatory and respiratory systems: Secondary | ICD-10-CM

## 2013-03-13 DIAGNOSIS — Z5189 Encounter for other specified aftercare: Secondary | ICD-10-CM

## 2013-03-13 DIAGNOSIS — T783XXD Angioneurotic edema, subsequent encounter: Secondary | ICD-10-CM

## 2013-03-13 DIAGNOSIS — J45909 Unspecified asthma, uncomplicated: Secondary | ICD-10-CM

## 2013-03-13 NOTE — Assessment & Plan Note (Signed)
No recurrence. Uncertain relation to work-place exposures.

## 2013-03-13 NOTE — Assessment & Plan Note (Signed)
Not clear what explains his "bottoming out" sensation with exercise and also with singing lessons.  It doesn't sound like simple EIA or hypoglycemia. He reports that his PCP wanted him to have an exercise stress test. Best option might be formal cardiopulmonary exercise stress test- discussed.

## 2013-03-13 NOTE — Patient Instructions (Addendum)
Order- Schedule CPXST cardiopulmonary exercise stress test      Dx dyspnea with exertion  Order- CXR   Dx chronic bronchitis  Ok to see how you do off Singulair

## 2013-03-13 NOTE — Progress Notes (Signed)
02/23/12- 50 yoM never smoker seen for his complaint of "bronchitis/cough". He is self-referred. PCP Dr Milinda Cave [facility]. He denies history of seasonal allergic rhinitis, asthma or lung disease. His problem began with an apparent cold in April. 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 He sometimes wears a mask. By May he said he had sinus congestion "like cement" then onset of cough and chest congestion. He was diagnosed with bronchitis and had chest x-ray. He took a few days off of work with little difference but then had a few days of vacation in [place] where he did better. On return, he again developed sinus congestion. Was treated initially with a Z-Pak, then Singulair and prednisone tapers twice. He then began daily Allegra which seemed to help his sinuses some. He has had persistent chest tightness with variable dry cough and wheeze. He never had fever, purulent discharge or headache. He denies pressure discomfort in his ears, GI upset, rash or adenopathy.  He had allergy skin testing twice at [facility] practice at [place], which is not part of this practice. We got a copy of skin test record from 12/21/2011 showing moderate intradermal reactions to a wide variety of common inhalant allergens. He says the skin testing was done for evaluation of a separate complaint of angioedema of the lips. This  happened twice at work. He was treated with antihistamines, including his Allegra, with no recurrence.  He has no history of ENT surgery. He describes his home as clean and dry with a brand-new HVAC and no mold. His family is well.  CXR- 01/18/12-  CHEST - 2 VIEW  Comparison: None  Findings: The cardiac silhouette, mediastinal and hilar contours  are within normal limits. There are mild bronchitic type  interstitial lung changes and streaky basilar atelectasis which may  suggest bronchitis or  reactive airways disease. No findings for  focal infiltrate, edema or effusion. The bony thorax is intact.  IMPRESSION:  Mild bronchitic type lung changes but no infiltrates or effusions.  Original Report Authenticated By: P. Loralie Champagne, M.D.   04/20/12- 74 yoM never smoker followed for asthmatic bronchitis, rhinisinusitis,  complicated by hx angioedema, + allergy stests,kin  Patient states has had bronchitis twice since last visit. States antibiotics help when he is on them but bronchitis comes back when he finishes them. Has started taking a "protective blend oil" and it has helped. States is still having sinus congestion.  Wheezing started again 2 weeks ago,  one week after end of last course of antibiotic and prednisone Treating himself with 2 different aromatherapy herbal products which she credits for keeping him better. Rare use of albuterol rescue inhaler. Symbicort works much better with [employer]  and today he feels "fine". He still thinks particular areas of dust and dirt in his work are triggers but he cannot define what products he may be exposed to. He has not exercised in the last several weeks because of back discomfort. Radiology reviewed w/ him: CT maxillofacial 02/26/12-IMPRESSION:  Mild paranasal sinus disease most promptly affecting the frontal  sinuses and right maxillary sinus.  Original Report Authenticated By: Andreas Newport, M.D. Allergy Profile 02/23/2012: Total IgE 120.3, with some specific allergy antibodies but no significant elevations pointing to keep triggers. Peripheral eosinophils 6.8%  09/05/12- 51 yoM never smoker followed for asthmatic bronchitis, rhinisinusitis,  complicated by hx angioedema, + allergy skin tests, FOLLOWS FOR:  coughing has decreased. while exerting himself he "bottoms out" and feels like he has something sitting on his chest. He has a Systems analyst at [employer]. During cardio exercises he will wheeze. Pre-treating without but overall  helps a little. His primary physician did EKG and Exam of his heart. He is now wearing a paper pressurized hood at work to filter his breathing air. Continues Symbicort twice daily. Last chest x-ray 01/18/2012 showed bronchitis changes  714/14-51 yoM never smoker followed for asthmatic bronchitis, rhinisinusitis,  complicated by hx angioedema, + allergy skin tests, FOLLOWS FOR: pt reports he is still "bottoming out" with his breathing esp w exercising-- using rescue inhaler prior to exercise but does not feel this really helps out-- needing PFT results No recurrence of angioedema. Prefers Mucinex-D over Allegra-D for nasal congestion and still wears filter mask at work. Variable productive cough, some white phlegm, no wheeze. No effect of rescue inhaler.  Again describes transient sensation with hard work-outs and also with singing lessons of "bottoming out" - not relieved or prevented by rescue inhaler or Tudorza,  or pre-workout drink and not associated with palpitation, wheeze, cough. PFT- 09/26/12- Completely WNL with no response to bronchodilator.   ROS-see HPI Constitutional:   No-   weight loss, night sweats, fevers, chills, fatigue, lassitude. HEENT:   No-  headaches, difficulty swallowing, tooth/dental problems, sore throat,       No-  sneezing, itching, ear ache,  + nasal congestion, post nasal drip,  CV:  No- anginal  chest pain, orthopnea, PND, swelling in lower extremities, anasarca, dizziness, palpitations Resp: +  shortness of breath with exertion or at rest.              No-   productive cough,  no- non-productive cough,  No- coughing up of blood.              No-   change in color of mucus.  No- wheezing.   Skin: No-   rash or lesions. GI:  No-   heartburn, indigestion, abdominal pain, nausea, vomiting,  GU:  MS:  No-   joint pain or swelling.   Neuro-     nothing unusual Psych:  No- change in mood or affect. No depression or anxiety.  No memory loss.  OBJ- Physical  Exam General- Alert, Oriented, Affect-appropriate, Distress- none acute, fit- appearing Skin- rash-none, lesions- none, excoriation- none Lymphadenopathy- none Head- atraumatic            Eyes- Gross vision intact, PERRLA, conjunctivae and secretions clear            Ears- Hearing, canals-normal            Nose- clear, no-Septal dev, mucus, polyps, erosion, perforation             Throat- Mallampati III , mucosa clear , drainage- none, tonsils- atrophic Neck- flexible , trachea midline, no stridor , thyroid nl, carotid no bruit Chest - symmetrical excursion , unlabored           Heart/CV- RRR , no murmur , no gallop  , no rub, nl s1 s2                           - JVD- none , edema- none, stasis changes- none, varices- none           Lung- no- wheeze, cough- none , dullness-none, rub- none. + fine rhonchi R upper anterior chest..  Chest wall-  Abd-  Br/ Gen/ Rectal- Not done, not indicated Extrem- cyanosis- none, clubbing, none, atrophy- none, strength- nl Neuro- grossly intact to observation

## 2013-03-13 NOTE — Progress Notes (Signed)
Quick Note:  Spoke with patient, made him aware of results as listed below per Dr. Maple Hudson Patient verbalized understanding and nothing further needed at this time ______

## 2013-03-16 ENCOUNTER — Telehealth: Payer: Self-pay | Admitting: Internal Medicine

## 2013-03-16 MED ORDER — TIOTROPIUM BROMIDE MONOHYDRATE 18 MCG IN CAPS: 18.0000 ug | ORAL_CAPSULE | Freq: Every day | RESPIRATORY_TRACT | Status: DC

## 2013-03-16 NOTE — Telephone Encounter (Signed)
Pt las seen on 03-13-14 and states that CY mentioned calling in an Rx for spiriva. I do not see this mentioned in OV note. Dr. Maple Hudson please advise if you want rx to be sent for this pt? Carron Curie, CMA No Known Allergies

## 2013-03-16 NOTE — Telephone Encounter (Signed)
ATC pt, line busy. WCB. I do not see any mention of an inhaler in OV note from 03-13-13. Carron Curie, CMA

## 2013-03-16 NOTE — Telephone Encounter (Signed)
[id number] on home and cell number. Jamien Casanova, CMA  

## 2013-03-16 NOTE — Telephone Encounter (Signed)
Ok please order Spiriva # 30, 1 daily, ref prn.

## 2013-03-16 NOTE — Telephone Encounter (Signed)
Rx sent pt is aware. Carron Curie, CMA

## 2013-03-16 NOTE — Telephone Encounter (Signed)
Pt returned triage's call & can be reached at [phone].  Douglas [name]

## 2013-03-21 ENCOUNTER — Encounter (HOSPITAL_COMMUNITY): Payer: 59

## 2013-03-23 ENCOUNTER — Ambulatory Visit (HOSPITAL_COMMUNITY): Payer: 59 | Attending: Internal Medicine

## 2013-03-23 DIAGNOSIS — R0600 Dyspnea, unspecified: Secondary | ICD-10-CM

## 2013-03-23 DIAGNOSIS — R0602 Shortness of breath: Secondary | ICD-10-CM

## 2013-04-12 NOTE — Progress Notes (Signed)
Quick Note:  LMTCB ______ 

## 2013-04-17 ENCOUNTER — Telehealth: Payer: Self-pay | Admitting: Internal Medicine

## 2013-04-17 NOTE — Telephone Encounter (Signed)
Pt is aware of results. 

## 2013-05-26 ENCOUNTER — Telehealth: Payer: Self-pay | Admitting: Gastroenterology

## 2013-05-26 NOTE — Telephone Encounter (Signed)
Pt with hx of of chronic ruq pain and IBS, hemorrhoids and diverticulosis.  Last COLON 05/17/12 with hyperplastic polyp and 5 year recall. Pt reports for 4 weeks he had hemorrhoid pain and he has pain that feels as though something is poking in his rectum. He states his BMs are regular and denies constipation. Pt will see Willette Cluster, NP on Monday.

## 2013-05-29 ENCOUNTER — Encounter: Payer: Self-pay | Admitting: Nurse Practitioner

## 2013-05-29 ENCOUNTER — Ambulatory Visit (INDEPENDENT_AMBULATORY_CARE_PROVIDER_SITE_OTHER): Payer: 59 | Admitting: Nurse Practitioner

## 2013-05-29 VITALS — BP 120/90 | HR 84 | Ht 69.5 in | Wt 216.8 lb

## 2013-05-29 DIAGNOSIS — K648 Other hemorrhoids: Secondary | ICD-10-CM

## 2013-05-29 DIAGNOSIS — K625 Hemorrhage of anus and rectum: Secondary | ICD-10-CM

## 2013-05-29 HISTORY — DX: Other hemorrhoids: K64.8

## 2013-05-29 MED ORDER — HYDROCORTISONE ACETATE 25 MG RE SUPP: 25.0000 mg | Freq: Two times a day (BID) | RECTAL | Status: DC

## 2013-05-29 NOTE — Progress Notes (Signed)
  History of Present Illness:  Patient is a 52 year old male known to Dr. Christella Hartigan. He has a history of adenomatous colon polyps though had only hyperplastic polyps on last surveillance colonoscopy September 2013.  Patient is here today for evaluation of rectal bleeding. No abdominal or rectal pain but feels like there is a "rock" in his rectum. He has passed some mucous and blood with BMs over the last few days but BMs otherwise normal.   No constipation. No diarrhea.   Current Medications, Allergies, Past Medical History, Past Surgical History, Family History and Social History were reviewed in [employer] record.  Physical Exam: General: Well developed , white male in no acute distress Head: Normocephalic and atraumatic Eyes:  sclerae anicteric, conjunctiva pink  Ears: Normal auditory acuity Lungs: Clear throughout to auscultation Heart: Regular rate and rhythm Abdomen: Soft, non distended, non-tender. No masses, no hepatomegaly. Normal bowel sounds Rectal: In left decubitus position there is a small protruding internal  hemorrhoid Musculoskeletal: Symmetrical with no gross deformities  Extremities: No edema  Neurological: Alert oriented x 4, grossly nonfocal Psychological:  Alert and cooperative. Normal mood and affect  Assessment and Recommendations:  Painless rectal bleeding, likely secondary to internal hemorrhoid seen on anoscopy. Will treat with BID steroid suppositories for 10 days. Patient will call us with a condition update following treatment.

## 2013-05-29 NOTE — Patient Instructions (Addendum)
We have sent the following medications to your pharmacy for you to pick up at your convenience: [facility]  Call us in 10 days and let us know how your doing.    I appreciate the opportunity to care for you.

## 2013-05-30 NOTE — Progress Notes (Signed)
I agree the the plan above

## 2013-06-14 ENCOUNTER — Telehealth: Payer: Self-pay | Admitting: Family Medicine

## 2013-06-14 ENCOUNTER — Other Ambulatory Visit: Payer: Self-pay | Admitting: Internal Medicine

## 2013-06-14 MED ORDER — TESTOSTERONE 10 MG/ACT (2%) TD GEL: 40.0000 mg | Freq: Every day | TRANSDERMAL | Status: DC

## 2013-06-14 NOTE — Telephone Encounter (Signed)
I printed rx for 1 bottle of his [place].   He needs to come in for lab visit sometime in the next 1 month to recheck testosterone level and to check CBC. Remind him to time the lab visit for about 3 hours after he has applied his Azucena Freed for the day.-thx

## 2013-06-14 NOTE — Telephone Encounter (Signed)
Patient requesting refill on fortesta 10mg  Gel pump.  Patient last seen 12/02/12.  Patient was supposed to have 4 month follow up.  No upcoming appointments.  Patient last received medication 12/02/12 x 5 refills.  Please advise refill.

## 2013-06-19 NOTE — Telephone Encounter (Signed)
Unable to contact patient.

## 2013-06-20 ENCOUNTER — Encounter: Payer: Self-pay | Admitting: Gastroenterology

## 2013-06-20 ENCOUNTER — Ambulatory Visit (INDEPENDENT_AMBULATORY_CARE_PROVIDER_SITE_OTHER): Payer: 59 | Admitting: Gastroenterology

## 2013-06-20 VITALS — BP 116/64 | HR 70 | Ht 70.0 in | Wt 214.0 lb

## 2013-06-20 DIAGNOSIS — K648 Other hemorrhoids: Secondary | ICD-10-CM

## 2013-06-20 MED ORDER — HYDROCORTISONE ACETATE 25 MG RE SUPP: RECTAL | Status: DC

## 2013-06-20 NOTE — Patient Instructions (Signed)
We sent a prescription for anal suppositories to [facility] [address].  [facility].

## 2013-06-20 NOTE — Progress Notes (Signed)
06/20/2013 Douglas [name] [id number] Dec 12, 1960   History of Present Illness: This is a 52 year old male who is known to Dr. Christella Hartigan. He has a history of adenomatous colon polyps but he had only hyperplastic polyps on surveillance colonoscopy in September 2013. He was just here on September 29 and saw our nurse practitioner, Gunnar Fusi, for complaints of rectal bleeding. On exam he was found to have 1 small protruding internal hemorrhoid on anoscopy. He was treated with 10 day course of hydrocortisone suppositories. He states he continued to have some bleeding throughout the 10 days while using the suppositories. He just finished using them several days ago and the bleeding has now stopped and has not been present for the last couple of days. He came back to the office today because there is still some discomfort in the rectum; sensation that there is something in his rectum.  Current Medications, Allergies, Past Medical History, Past Surgical History, Family History and Social History were reviewed in [employer] record.   Physical Exam: BP 116/64  Pulse 70  Ht 5\' 10"  (1.778 m)  Wt 214 lb (97.07 kg)  BMI 30.71 kg/m2 General: Well developed white male in no acute distress Head: Normocephalic and atraumatic Eyes:  Sclerae anicteric, conjunctiva pink  Ears: Normal auditory acuity. Musculoskeletal: Symmetrical with no gross deformities  Extremities: No edema  Neurological: Alert oriented x 4, grossly nonfocal Psychological:  Alert and cooperative. Normal mood and affect  Assessment and Recommendations: -Hemorrhoids:  We will repeat a course of the hydrocortisone suppositories. I discussed with him the possibility of evaluation for in-office banding. He states that he also has external hemorrhoids that he would want removed if possible at the same time. I told him we did not remove external hemorrhoids here in our practice and that he would need to be seen by a surgeon in  order to have that performed.

## 2013-06-25 NOTE — Progress Notes (Signed)
I agree with the plans above.

## 2013-09-06 ENCOUNTER — Telehealth: Payer: Self-pay | Admitting: Family Medicine

## 2013-09-06 MED ORDER — TESTOSTERONE 10 MG/ACT (2%) TD GEL: 40.0000 mg | Freq: Every day | TRANSDERMAL | Status: DC

## 2013-09-06 NOTE — Telephone Encounter (Signed)
Patient requesting fortesta refill.  I refilled for 30 day supply but patient needs an office visit for anymore refills.

## 2013-09-07 ENCOUNTER — Telehealth: Payer: Self-pay | Admitting: Gastroenterology

## 2013-09-08 NOTE — Telephone Encounter (Signed)
Pt has been advised that per Jessica's last note the pt would need to be seen by [facility] for external hem, he will see his PCP next week and discuss

## 2013-09-11 NOTE — Telephone Encounter (Signed)
Patient scheduled an OV 09/13/13

## 2013-09-13 ENCOUNTER — Encounter: Payer: Self-pay | Admitting: Family Medicine

## 2013-09-13 ENCOUNTER — Ambulatory Visit (INDEPENDENT_AMBULATORY_CARE_PROVIDER_SITE_OTHER): Payer: 59 | Admitting: Family Medicine

## 2013-09-13 VITALS — BP 164/109 | HR 82 | Temp 98.2°F | Resp 18 | Ht 71.0 in | Wt 213.0 lb

## 2013-09-13 DIAGNOSIS — K648 Other hemorrhoids: Secondary | ICD-10-CM

## 2013-09-13 DIAGNOSIS — K644 Residual hemorrhoidal skin tags: Secondary | ICD-10-CM

## 2013-09-13 DIAGNOSIS — E291 Testicular hypofunction: Secondary | ICD-10-CM

## 2013-09-13 LAB — PSA: PSA: 1.06 ng/mL (ref 0.10–4.00)

## 2013-09-13 MED ORDER — TESTOSTERONE 10 MG/ACT (2%) TD GEL: 40.0000 mg | Freq: Every day | TRANSDERMAL | Status: DC

## 2013-09-13 NOTE — Progress Notes (Signed)
OFFICE NOTE  09/13/2013  CC:  Chief Complaint  Patient presents with  . Medication Refill  . Hemorrhoids     HPI: Patient is a 53 y.o. Caucasian male who is here for f/u male hypogonadism. Uses the testost gel irregularly.  However, he says using it this way still helps his ED and energy level. He is not able to be clear about how long he has been without this med, so we're assuming he's at a low testost level at this time given his history. Denies hx of recent elevated BP anywhere else, no use of OTC decongestants.  He did take a goodies powder (?caffeine) this morning.    Has hx of intermittently bleeding internal and external hemorroids since 03/2013---started after doing ETT. Denies constipation.  Says use of OTC products and rx hydrocortisone suppositories have not been helpful.   Says he leaks stool and has to wipe anal area 10 times a day.  +Itching and pain constantly in anal region.   Pertinent PMH:  Past Medical History  Diagnosis Date  . Depression     Prozac in the past not much help.  Spontaneously resolved.  . Adenomatous colon polyp 05/07/06    Repeat colonoscopy 05/17/2012 showed 2 small polyps that were removed--path showed Hyperplastic-not adenomatous.  Still needs repeat 5 yrs.  . Diverticulosis   . Hypogonadism male     Felt improved on andogel but testost levels did not improve.  . Rotator cuff tear 2011    [facility]: conservative mgmt--symptoms stable as of 04/2011.  . 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  . Insomnia   . Hypertriglyceridemia     fish oil OTC  . Angioedema of lips     saw allergist 11/2011  . Hemorrhoids     ext and int  . Generalized anxiety disorder 08/11/2012  . Chronic recurrent sinusitis 02/28/2012  . Asthmatic bronchitis , chronic 01/11/2012    Question occupational asthma with no specific agents identified. He does notice that some areas of his job are  particularly dusty and dirty. Currently he is well controlled using Symbicort through an [employer] and rarely needing his rescue inhaler. This would be an acceptable long-term status. Elevated nonspecific total IgE and peripheral eosinophilia to suggest an atopic trigger with sensitization to something Consider Daliresp for future use if needed   . Hypogonadism, male 04/08/2011    Several time has been <100 (saw [facility] in 2011 for this, w/u for secondary hypogonadism was unremarkable), most recent testosterone check via [facility] 10/2010 was 1.15 ng/ml.     MEDS:  Outpatient Prescriptions Prior to Visit  Medication Sig Dispense Refill  . DYMISTA 137-50 MCG/ACT SUSP INHALE 1 TO 2 PUFFS INTO THE NOSE AT BEDTIME  1 Bottle  5  . EPINEPHrine (EPIPEN) 0.3 mg/0.3 mL DEVI Inject 0.3 mLs (0.3 mg total) into the muscle once.  1 Device  3  . fexofenadine-pseudoephedrine (ALLEGRA-D 24 HOUR) 180-240 MG per 24 hr tablet Take 1 tablet by mouth daily.  90 tablet  1  . NON FORMULARY Forskolin 125mg  once daily      . UNABLE TO FIND Med Name: Doterra Massage blend oil, massage on neck  2-3 times a day PRN      . budesonide-formoterol (SYMBICORT) 160-4.5 MCG/ACT inhaler Inhale 1-2 puffs into the lungs 2 (two) times daily.       . diphenhydrAMINE (BENADRYL) 25  MG tablet Take 25 mg by mouth every 6 (six) hours as needed.      . hydrocortisone (ANUSOL-HC) 25 MG suppository Use 1 suppository at bedtime for 14 days.  14 suppository  1  . tadalafil (CIALIS) 20 MG tablet Take 1 tablet (20 mg total) by mouth daily as needed.  10 tablet  5  . Testosterone (FORTESTA) 10 MG/ACT (2%) GEL Place 40 mg onto the skin daily. (2 pumps to each thigh)  60 g  0   No facility-administered medications prior to visit.    PE: Blood pressure 164/109, pulse 82, temperature 98.2 F (36.8 C), temperature source Temporal, resp. rate 18, height 5\' 11"  (1.803 m), weight 213 lb (96.616 kg), SpO2 98.00%. Gen: Alert, well appearing.   Patient is oriented to person, place, time, and situation. Anal exam: several small, non-thrombosed external hemorrhoids are noted, no fissures.  No blood. Anal region mildly inflamed/hyperpigmented, tender diffusely. I did not do a DRE today.  IMPRESSION AND PLAN:  1) Male hypogonadism. Only sporadic use of fortesta helps.  I told him that if he uses this at least 4 days per week then I want him to come in for testost level check + CBC in 2 mo.  Refilled fortesta today. Drew blood for PSA today.  2) Internal (eval by GI) and external hemorrhoid disease: referral to gen surg ordered today.  FOLLOW UP: 2 mo lab, 6 mo o/v.

## 2013-09-13 NOTE — Progress Notes (Signed)
Pre visit review using our clinic review tool, if applicable. No additional management support is needed unless otherwise documented below in the visit note. 

## 2013-09-14 ENCOUNTER — Ambulatory Visit: Payer: 59 | Admitting: Internal Medicine

## 2013-09-28 ENCOUNTER — Ambulatory Visit (INDEPENDENT_AMBULATORY_CARE_PROVIDER_SITE_OTHER): Payer: 59 | Admitting: General Surgery

## 2013-09-28 ENCOUNTER — Encounter (INDEPENDENT_AMBULATORY_CARE_PROVIDER_SITE_OTHER): Payer: Self-pay | Admitting: General Surgery

## 2013-09-28 VITALS — BP 132/76 | HR 72 | Temp 98.0°F | Resp 18 | Ht 71.0 in | Wt 213.0 lb

## 2013-09-28 DIAGNOSIS — K644 Residual hemorrhoidal skin tags: Secondary | ICD-10-CM

## 2013-09-28 MED ORDER — HYDROCORTISONE ACETATE 25 MG RE SUPP: 25.0000 mg | Freq: Two times a day (BID) | RECTAL | Status: DC

## 2013-09-28 NOTE — Progress Notes (Signed)
Subjective:     Patient ID: Douglas [name], male   DOB: Jan 08, 1961, 53 y.o.   MRN: [id number]  HPI The patient is a 54 year old male who is referred by Dr. Vicente Serene for evaluation of internal and external hemorrhoids. The patient states that he's had this for approximately 5 years but has recently since November and had to do with the more often. He states is a lot of pain with bleeding. He also states that there is some mucousy discharge as well as brown discharge at times. He states he is proceeding with sitz baths as well as tried multiple over-the-counter medications. The patient had a previous colonoscopy by Dr. Ardis Hughs and was seen to have polyps which were removed, there was no internal hemorrhoids seen at the time.  Review of Systems  Constitutional: Negative.   HENT: Negative.   Eyes: Negative.   Respiratory: Negative.   Cardiovascular: Negative.   Gastrointestinal: Positive for anal bleeding and rectal pain. Negative for nausea, diarrhea and constipation.  Endocrine: Negative.   Neurological: Negative.        Objective:   Physical Exam  Constitutional: He is oriented to person, place, and time. He appears well-developed and well-nourished.  HENT:  Head: Normocephalic and atraumatic.  Eyes: Conjunctivae and EOM are normal. Pupils are equal, round, and reactive to light.  Neck: Normal range of motion. Neck supple.  Cardiovascular: Normal rate, regular rhythm and normal heart sounds.   Pulmonary/Chest: Effort normal and breath sounds normal.  Abdominal: Soft. Bowel sounds are normal. He exhibits no distension and no mass. There is no tenderness. There is no rebound and no guarding.  Genitourinary: Rectal exam shows external hemorrhoid, internal hemorrhoid and mass. Rectal exam shows no fissure.     Musculoskeletal: Normal range of motion.  Neurological: He is alert and oriented to person, place, and time.  Skin: Skin is warm and dry.       Assessment:     53 year old  male with internal and external hemorrhoids.     Plan:     1. We will prescribe Anusol suppositories to see if this helps with a local inflammation. 2. We'll have patient follow back up in 2 weeks. At that time we can discuss surgical options. I discussed with him the likely pain postoperatively , and he understands.

## 2013-10-12 ENCOUNTER — Encounter (INDEPENDENT_AMBULATORY_CARE_PROVIDER_SITE_OTHER): Payer: 59 | Admitting: General Surgery

## 2013-11-08 ENCOUNTER — Other Ambulatory Visit (INDEPENDENT_AMBULATORY_CARE_PROVIDER_SITE_OTHER): Payer: Self-pay

## 2013-11-08 MED ORDER — HYDROCORTISONE ACETATE 25 MG RE SUPP: 25.0000 mg | Freq: Two times a day (BID) | RECTAL | Status: DC

## 2013-11-14 ENCOUNTER — Encounter: Payer: Self-pay | Admitting: Physician Assistant

## 2013-11-14 ENCOUNTER — Ambulatory Visit (INDEPENDENT_AMBULATORY_CARE_PROVIDER_SITE_OTHER): Payer: 59 | Admitting: Physician Assistant

## 2013-11-14 VITALS — BP 152/84 | HR 82 | Ht 70.0 in | Wt 219.0 lb

## 2013-11-14 DIAGNOSIS — Z86010 Personal history of colonic polyps: Secondary | ICD-10-CM

## 2013-11-14 DIAGNOSIS — K648 Other hemorrhoids: Secondary | ICD-10-CM

## 2013-11-14 DIAGNOSIS — K644 Residual hemorrhoidal skin tags: Secondary | ICD-10-CM

## 2013-11-14 MED ORDER — LIDOCAINE-HYDROCORTISONE ACE 3-0.5 % RE CREA: TOPICAL_CREAM | RECTAL | Status: DC

## 2013-11-14 MED ORDER — HYDROCORTISONE ACETATE 25 MG RE SUPP: RECTAL | Status: DC

## 2013-11-14 NOTE — Progress Notes (Signed)
i agree with the plan above, referral to Dr. Carlean Purl to consider hemorrhoid ligation

## 2013-11-14 NOTE — Progress Notes (Signed)
Subjective:    Patient ID: Douglas [name], male    DOB: November 21, 1960, 53 y.o.   MRN: [id number]  HPI Douglas [name] Is a pleasant 53 year old white male known to Dr. Ardis Hughs. He has history of adenomatous colon polyps. He had last colonoscopy done in September of 2013 and had hyperplastic polyps removed at that time. He did not have any mention of hemorrhoids at the time of that colonoscopy. Patient states she's been having problems with rectal bleeding since August of 2014 in at this time seeing blood on a daily basis. That he had about a two-week stretcher he didn't have any difficulty but other than that has been having problems with intermittent itching irritation and bleeding. He says also he has oozing of small amounts of stool and blood at times. Bowel movements are somewhat irregular and he has occasional urgency generally no problems with constipation or straining. This currently having external hemorrhoidal.pain.    Patient had been referred to surgery and saw Dr. Rosendo Gros in January of 2015. His note is reviewed and patient is felt to have internal and external hemorrhoids. He was given some local measures and asked her return in 2-3 weeks to discuss surgical options. Patient says that P.not like the sounds of hemorrhoid surgery and decided to come back here first.     Review of Systems  Constitutional: Negative.   HENT: Negative.   Eyes: Negative.   Respiratory: Negative.   Cardiovascular: Negative.   Gastrointestinal: Positive for anal bleeding and rectal pain.  Endocrine: Negative.   Genitourinary: Negative.   Musculoskeletal: Negative.   Skin: Negative.   Allergic/Immunologic: Negative.   Neurological: Negative.   Hematological: Negative.   Psychiatric/Behavioral: Negative.    Outpatient Prescriptions Prior to Visit  Medication Sig Dispense Refill  . diphenhydrAMINE (BENADRYL) 25 MG tablet Take 25 mg by mouth every 6 (six) hours as needed.      Marland Kitchen DYMISTA 137-50 MCG/ACT SUSP  INHALE 1 TO 2 PUFFS INTO THE NOSE AT BEDTIME  1 Bottle  5  . EPINEPHrine (EPIPEN) 0.3 mg/0.3 mL DEVI Inject 0.3 mLs (0.3 mg total) into the muscle once.  1 Device  3  . fexofenadine-pseudoephedrine (ALLEGRA-D 24 HOUR) 180-240 MG per 24 hr tablet Take 1 tablet by mouth daily.  90 tablet  1  . NON FORMULARY Forskolin 125mg  once daily      . tadalafil (CIALIS) 20 MG tablet Take 1 tablet (20 mg total) by mouth daily as needed.  10 tablet  5  . Testosterone (FORTESTA) 10 MG/ACT (2%) GEL Place 40 mg onto the skin daily. (2 pumps to each thigh)  60 g  5  . UNABLE TO FIND Med Name: Doterra Massage blend oil, massage on neck  2-3 times a day PRN      . hydrocortisone (ANUSOL-HC) 25 MG suppository Use 1 suppository at bedtime for 14 days.  14 suppository  1  . hydrocortisone (ANUSOL-HC) 25 MG suppository Place 1 suppository (25 mg total) rectally 2 (two) times daily.  12 suppository  0  . budesonide-formoterol (SYMBICORT) 160-4.5 MCG/ACT inhaler Inhale 1-2 puffs into the lungs 2 (two) times daily.        No facility-administered medications prior to visit.   No Known Allergies Patient Active Problem List   Diagnosis Date Noted  . Rectal bleeding 05/29/2013  . Internal hemorrhoid, bleeding 05/29/2013  . Angioedema of lips 03/13/2013  . Asthma with bronchitis 03/13/2013  . External hemorrhoids 09/28/2012  . Acrophobia   . Prostate cancer  screening 04/24/2011  . Hypogonadism, male 04/08/2011   History  Substance Use Topics  . Smoking status: Never Smoker   . Smokeless tobacco: Never Used  . Alcohol Use: Yes     Comment: rarely   family history includes Diabetes in his father and mother; Heart disease in his father and mother; Hypertension in his father and mother.     Objective:   Physical Exam well-developed white male in no acute distress blood pressure 152 every 4 pulse 82 height 5 foot 10 weight 219. HEENT; nontraumatic normocephalic EOMI PERRLA sclera anicteric, Supple ;no JVD,  Cardiovascular; regular rate and rhythm with S1-S2 no murmur or gallop, Pulm;clear bilaterally, Abdomen; soft nontender nondistended bowel sounds are active there is no palpable mass or hepatosplenomegaly, Rectal; exam he has erythema and eructation of the. Anal skin, a swollen external hemorrhoid which is tender but not thrombosed and on anoscopy as least 1 edematous friable internal hemorrhoid. Extremities; no clubbing cyanosis or edema skin warm and dry, Psych; mood and affect normal and appropriate             Assessment & Plan: #1 #31  # #15  #53 year old male    #53 53 year old male with symptomatic internal and external hemorrhoids he currently has a swollen external hemorrhoid and friable internal hemorrhoid which is probably responsible for the rectal bleeding. He also has fairly significant perianal irritation.  #2 history of colon polyps last colonoscopy 2013 with hyperplastic polyps  Plan; balmex cream 3-4 times daily for perianal skin irritation Start Anusol-HC suppositories at bedtime x10 days for internal hemorrhoids. He may repeat a course as needed. Will schedule patient for an office hemorrhoid banding procedure with Dr. Carlean Purl. Procedure was discussed in detail with the patient and he is agreeable to proceed. Patient is aware of this will be treatment only for the internal hemorrhoids. AnaMantle cream 3-4 times daily as needed for external hemorrhoidal pain and he will continue sitz baths as needed

## 2013-11-14 NOTE — Patient Instructions (Signed)
Apply Balmex Cream to external skin irritation 2-3 times daily after Bowel Movements. You can get this at pharmacy, [facility], [place], [employer]. We sent prescriptions to [facility]. 1. Anusol HC Suppositories 2. Anamantal Lidocaine Cream  We made you an appointment with Dr. Carlean Purl for  Hemorrhoid banding on 11-28-2013 at 9:15 am.

## 2013-11-28 ENCOUNTER — Encounter: Payer: Self-pay | Admitting: Internal Medicine

## 2013-11-28 ENCOUNTER — Ambulatory Visit (INDEPENDENT_AMBULATORY_CARE_PROVIDER_SITE_OTHER): Payer: 59 | Admitting: Internal Medicine

## 2013-11-28 VITALS — BP 146/96 | HR 66 | Ht 70.0 in | Wt 214.8 lb

## 2013-11-28 DIAGNOSIS — K602 Anal fissure, unspecified: Secondary | ICD-10-CM

## 2013-11-28 DIAGNOSIS — K648 Other hemorrhoids: Secondary | ICD-10-CM

## 2013-11-28 MED ORDER — DILTIAZEM GEL 2 %: 1.0000 "application " | Freq: Three times a day (TID) | CUTANEOUS | Status: DC

## 2013-11-28 NOTE — Patient Instructions (Addendum)
Today you have been given a handout to read and follow on Anal fissures.  We have sent an rx for Diltiazem gel to [facility] for you to pick up and use as directed.  Please use 1-2 Tablespoons of Benefiber daily.  Handout provided.  Follow up with Dr. Carlean Purl in 2 months.   I appreciate the opportunity to care for you.

## 2013-11-28 NOTE — Assessment & Plan Note (Signed)
These are improved - may need banding but main problem now  is fissure. See anal fissure.

## 2013-11-28 NOTE — Assessment & Plan Note (Signed)
Plan for diltiazem gel 2% tid and RTC 8 weeks. Anal fissure handout proided. Benefiber 1-2 tbsp daily If this fails may need to see surgery - if improving could continue the diltiazem gel another 2 months

## 2013-11-28 NOTE — Progress Notes (Signed)
Patient ID: Douglas [name], male   DOB: March 21, 1961, 53 y.o.   MRN: [id number]          The patient was seen by Nicoletta Ba., PA-C because of persistent rectal bleeding and discharge. He has a hx of chronic intermittent symptomatic hemorrhoids. He had a colonoscopy last in 2013.  Was seen by Dr. Rosendo Gros of [place] in January with internal and external hemorrhoids (posterior) and told surgery was painful and should avoid if possible. He was rxed [facility] and told to f/u 2 weeks but did not.  Saw [place] 11/14/2013 and rectal and anoscopy exams report external hemorrhoid and at least 1 internal hemorrhoid. Also had anal irritation.  From talking to the patient it sounds like he may have had 1 thrombosed hemorrhoid at some time. He is actually better without bleeding now but still has some mucous dc and pain with defecation. He was having frequent posterior ano-rectal pain with defecation before.  Says he moves his bowels without difficulty most of the time but has refrained from defecation some lately due to fear of pain.  Rectal exam left lateral decubitus shows a firm sentinel pile posterior and a small fleshy tag anterior but normal anoderm otherwise.  DRE shows induration in posterior anal canal and mild tenderness there, no mass.   Anoscopy was performed after verbal informed consent with the patient in the left lateral decubitus position while  and revealed a moderate posterior anal fissure and grade 1 internal hemorrhoids in all positions.  Internal hemorrhoids, bleeding and discharge These are improved - may need banding but main problem now  is fissure. See anal fissure.  Anal fissure - posterior Plan for diltiazem gel 2% tid and RTC 8 weeks. Anal fissure handout proided. Benefiber 1-2 tbsp daily If this fails may need to see surgery - if improving could continue the diltiazem gel another 2 months   I appreciate the opportunity to care for this patient.  WL:[id number] H,  MD

## 2014-02-02 ENCOUNTER — Other Ambulatory Visit: Payer: Self-pay | Admitting: Family Medicine

## 2014-02-02 NOTE — Telephone Encounter (Signed)
Pt requesting rf of cialis. Last OV was 09/13/13.  Last rf was 12/12/12 x 5 rfs.  Please advise rf.

## 2014-03-08 ENCOUNTER — Ambulatory Visit (INDEPENDENT_AMBULATORY_CARE_PROVIDER_SITE_OTHER): Payer: 59 | Admitting: Family Medicine

## 2014-03-08 ENCOUNTER — Encounter: Payer: Self-pay | Admitting: Family Medicine

## 2014-03-08 VITALS — BP 148/92 | HR 64 | Temp 98.6°F | Resp 18 | Ht 71.0 in | Wt 211.0 lb

## 2014-03-08 DIAGNOSIS — E291 Testicular hypofunction: Secondary | ICD-10-CM

## 2014-03-08 LAB — CBC WITH DIFFERENTIAL/PLATELET
Basophils Absolute: 0 10*3/uL (ref 0.0–0.1)
Basophils Relative: 0.6 % (ref 0.0–3.0)
EOS ABS: 0.2 10*3/uL (ref 0.0–0.7)
EOS PCT: 3 % (ref 0.0–5.0)
HCT: 40.6 % (ref 39.0–52.0)
Hemoglobin: 13.7 g/dL (ref 13.0–17.0)
Lymphocytes Relative: 39.1 % (ref 12.0–46.0)
Lymphs Abs: 2.4 10*3/uL (ref 0.7–4.0)
MCHC: 33.8 g/dL (ref 30.0–36.0)
MCV: 84 fl (ref 78.0–100.0)
MONO ABS: 0.3 10*3/uL (ref 0.1–1.0)
Monocytes Relative: 5.3 % (ref 3.0–12.0)
NEUTROS PCT: 52 % (ref 43.0–77.0)
Neutro Abs: 3.2 10*3/uL (ref 1.4–7.7)
PLATELETS: 250 10*3/uL (ref 150.0–400.0)
RBC: 4.84 Mil/uL (ref 4.22–5.81)
RDW: 14.1 % (ref 11.5–15.5)
WBC: 6.2 10*3/uL (ref 4.0–10.5)

## 2014-03-08 MED ORDER — TESTOSTERONE 10 MG/ACT (2%) TD GEL: 40.0000 mg | Freq: Every day | TRANSDERMAL | Status: DC

## 2014-03-08 NOTE — Progress Notes (Signed)
OFFICE NOTE  03/08/2014  CC:  Chief Complaint  Patient presents with  . Follow-up  . Hypogonadism     HPI: Patient is a 53 y.o. Caucasian male who is here for 6 mo f/u hypogonadism and testosterone replacement. Was using testost gel irregularly but getting decent results at last f/u. Was to get a T level two months after last visit if he found that he was using the T 4 days per week or more. PSA level 09/14/13 was 1.06.  About 4 wks ago he started using the test gel daily and finds it has made a big difference compared to taking it sporadically. He last applied his testost gel about 24 hrs ago. Cialis on a prn basis is helpful as well.    Allergist put him on allergy shots, pt didn't think they were helping (was on maintenance for about 4 mo).  He has started taking phytoplankton ordered on line and takes allegra daily and says his allergies and asthma are well controlled currently. Was on montelukast but says he stopped it b/c allegra helps better. He is no longer on his symbicort.  Last required rescue inhaler was 4 mo ago when he used it pre-exercise. Has not had any swelling/angioedema in a long time.   Pertinent PMH:  Past medical, surgical, social, and family history reviewed and no changes are noted since last office visit.  MEDS:  Outpatient Prescriptions Prior to Visit  Medication Sig Dispense Refill  . CIALIS 20 MG tablet TAKE 1 TABLET (20 MG TOTAL) BY MOUTH DAILY AS NEEDED.  10 tablet  5  . diltiazem 2 % GEL Apply 1 application topically 3 (three) times daily. Apply into rectum to first knuckle with gloved finger  30 g  2  . diphenhydrAMINE (BENADRYL) 25 MG tablet Take 25 mg by mouth every 6 (six) hours as needed.      Marland Kitchen EPINEPHrine (EPIPEN) 0.3 mg/0.3 mL DEVI Inject 0.3 mLs (0.3 mg total) into the muscle once.  1 Device  3  . fexofenadine-pseudoephedrine (ALLEGRA-D 24 HOUR) 180-240 MG per 24 hr tablet Take 1 tablet by mouth daily.  90 tablet  1  . hydrocortisone  (ANUSOL-HC) 25 MG suppository Use 1 suppositories at bedtime for 10 days  10 suppository  1  . Testosterone (FORTESTA) 10 MG/ACT (2%) GEL Place 40 mg onto the skin daily. (2 pumps to each thigh)  60 g  5  . UNABLE TO FIND Med Name: Doterra Massage blend oil, massage on neck  2-3 times a day PRN      . budesonide-formoterol (SYMBICORT) 160-4.5 MCG/ACT inhaler Inhale 1-2 puffs into the lungs 2 (two) times daily.       Marland Kitchen DYMISTA 137-50 MCG/ACT SUSP INHALE 1 TO 2 PUFFS INTO THE NOSE AT BEDTIME  1 Bottle  5  . lidocaine-hydrocortisone (ANAMANTEL HC) 3-0.5 % CREA Apply to external hemorrhoids 3-4 times daily.  28.35 g  1   No facility-administered medications prior to visit.    PE: Blood pressure 148/92, pulse 64, temperature 98.6 F (37 C), temperature source Temporal, resp. rate 18, height 5\' 11"  (1.803 m), weight 211 lb (95.709 kg), SpO2 95.00%. Gen: Alert, well appearing.  Patient is oriented to person, place, time, and situation. ZSW:FUXN: no injection, icteris, swelling, or exudate.  EOMI, PERRLA. Mouth: lips without lesion/swelling.  Oral mucosa pink and moist. Oropharynx without erythema, exudate, or swelling.  CV: RRR, no m/r/g.   LUNGS: CTA bilat, nonlabored resps, good aeration in all lung fields.  IMPRESSION AND PLAN:  1) Male hypogonadism; doing well on testost gel. Time for Testost level check and Hb monitoring.  2) Allergic rhinitis and asthma: currently taking a nasal spray that he cannot recall the name of + allegra D 24h and phytoplankton and says he feels great.   He self d/c'd his singulair, symbicort, and elected to stop allergy shots after only 4 mo of maintenance therapy.  An After Visit Summary was printed and given to the patient.  FOLLOW UP: 6 mo

## 2014-03-08 NOTE — Progress Notes (Signed)
Pre visit review using our clinic review tool, if applicable. No additional management support is needed unless otherwise documented below in the visit note. 

## 2014-03-09 LAB — TESTOSTERONE, FREE, TOTAL, SHBG
SEX HORMONE BINDING: 11 nmol/L — AB (ref 13–71)
Testosterone, Free: 36.1 pg/mL — ABNORMAL LOW (ref 47.0–244.0)
Testosterone-% Free: 3 % — ABNORMAL HIGH (ref 1.6–2.9)
Testosterone: 120 ng/dL — ABNORMAL LOW (ref 300–890)

## 2014-04-27 ENCOUNTER — Other Ambulatory Visit: Payer: Self-pay | Admitting: Internal Medicine

## 2014-06-19 ENCOUNTER — Other Ambulatory Visit: Payer: Self-pay | Admitting: Family Medicine

## 2014-06-19 NOTE — Telephone Encounter (Signed)
[facility] faxed RX request for fortesta but the RX should have refills.  They do have refills.

## 2014-06-30 ENCOUNTER — Emergency Department (HOSPITAL_BASED_OUTPATIENT_CLINIC_OR_DEPARTMENT_OTHER)
Admission: EM | Admit: 2014-06-30 | Discharge: 2014-06-30 | Disposition: A | Discharge status: Home / Self Care | Payer: 59 | Attending: Emergency Medicine | Admitting: Emergency Medicine

## 2014-06-30 ENCOUNTER — Encounter (HOSPITAL_BASED_OUTPATIENT_CLINIC_OR_DEPARTMENT_OTHER): Payer: Self-pay | Admitting: Emergency Medicine

## 2014-06-30 DIAGNOSIS — Z79899 Other long term (current) drug therapy: Secondary | ICD-10-CM | POA: Diagnosis not present

## 2014-06-30 DIAGNOSIS — X58XXXA Exposure to other specified factors, initial encounter: Secondary | ICD-10-CM | POA: Diagnosis not present

## 2014-06-30 DIAGNOSIS — Z8639 Personal history of other endocrine, nutritional and metabolic disease: Secondary | ICD-10-CM | POA: Diagnosis not present

## 2014-06-30 DIAGNOSIS — Z7952 Long term (current) use of systemic steroids: Secondary | ICD-10-CM | POA: Diagnosis not present

## 2014-06-30 DIAGNOSIS — F411 Generalized anxiety disorder: Secondary | ICD-10-CM | POA: Insufficient documentation

## 2014-06-30 DIAGNOSIS — Z8719 Personal history of other diseases of the digestive system: Secondary | ICD-10-CM | POA: Diagnosis not present

## 2014-06-30 DIAGNOSIS — S01512A Laceration without foreign body of oral cavity, initial encounter: Secondary | ICD-10-CM | POA: Diagnosis present

## 2014-06-30 DIAGNOSIS — F329 Major depressive disorder, single episode, unspecified: Secondary | ICD-10-CM | POA: Insufficient documentation

## 2014-06-30 DIAGNOSIS — J45909 Unspecified asthma, uncomplicated: Secondary | ICD-10-CM | POA: Insufficient documentation

## 2014-06-30 DIAGNOSIS — Y998 Other external cause status: Secondary | ICD-10-CM | POA: Insufficient documentation

## 2014-06-30 DIAGNOSIS — Y9289 Other specified places as the place of occurrence of the external cause: Secondary | ICD-10-CM | POA: Insufficient documentation

## 2014-06-30 DIAGNOSIS — Y9389 Activity, other specified: Secondary | ICD-10-CM | POA: Diagnosis not present

## 2014-06-30 MED ORDER — LIDOCAINE-EPINEPHRINE 1 %-1:100000 IJ SOLN
INTRAMUSCULAR | Status: AC
  Filled 2014-06-30: qty 1

## 2014-06-30 MED ORDER — LIDOCAINE-EPINEPHRINE (PF) 1 %-1:200000 IJ SOLN
30.0000 mL | Freq: Once | INTRAMUSCULAR | Status: DC
  Filled 2014-06-30: qty 30

## 2014-06-30 MED ORDER — LIDOCAINE-EPINEPHRINE 1 %-1:100000 IJ SOLN
30.0000 mL | Freq: Once | INTRAMUSCULAR | Status: AC
  Administered 2014-06-30: 30 mL via INTRADERMAL
  Filled 2014-06-30: qty 30

## 2014-06-30 NOTE — ED Provider Notes (Signed)
CSN: [id number]     Arrival date & time 06/30/14  1840 History  This chart was scribed for Tanna Furry, MD by Rayfield Citizen, ED Scribe. This patient was seen in room MH02/MH02 and the patient's care was started at 7:16 PM.    Chief Complaint  Patient presents with  . Tongue Laceration    The history is provided by the patient. No language interpreter was used.    HPI Comments: Douglas [name] is a 53 y.o. male who presents to the Emergency Department complaining of a bleeding tongue laceration. Patient explains that earlier today he was chewing gum when he noticed an odd texture/coloring to the gum; he took it out and noticed that his tongue was bleeding. He denies any known bites or trauma to the area. Patient reports that he has been taking 4-6 aspirin per day due to recent dental work.   Past Medical History  Diagnosis Date  . Depression     Prozac in the past not much help.  Spontaneously resolved.  . Adenomatous colon polyp 05/07/06    Repeat colonoscopy 05/17/2012 showed 2 small polyps that were removed--path showed Hyperplastic-not adenomatous.  Still needs repeat 5 yrs.  . Diverticulosis   . Hypogonadism male     Felt improved on andogel but testost levels did not improve.  . Rotator cuff tear 2011    [facility]: conservative mgmt--symptoms stable as of 04/2011.  . 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  . Insomnia   . Hypertriglyceridemia     fish oil OTC  . Angioedema of lips     saw allergist 11/2011  . Hemorrhoids     ext and int  . Generalized anxiety disorder 08/11/2012  . Chronic recurrent sinusitis 02/28/2012  . Asthmatic bronchitis , chronic 01/11/2012    Question occupational asthma with no specific agents identified. He does notice that some areas of his job are particularly dusty and dirty. Currently he is well controlled using Symbicort through an [employer] and rarely needing his  rescue inhaler. This would be an acceptable long-term status. Elevated nonspecific total IgE and peripheral eosinophilia to suggest an atopic trigger with sensitization to something Consider Daliresp for future use if needed   . Hypogonadism, male 04/08/2011    Several time has been <100 (saw [facility] in 2011 for this, w/u for secondary hypogonadism was unremarkable), most recent testosterone check via [facility] 10/2010 was 1.15 ng/ml.   . Internal hemorrhoids, bleeding and sicharge 05/29/2013   Past Surgical History  Procedure Laterality Date  . Adnoids    . Wisdom tooth extraction    . Colonoscopy  05/2006, 05/2012  . Polypectomy      Adenomatous 2007; hyperplastic 2013.  Repeat 5 yrs.   Family History  Problem Relation Age of Onset  . Heart disease Mother   . Hypertension Mother   . Diabetes Mother   . Heart disease Father   . Hypertension Father   . Diabetes Father    History  Substance Use Topics  . Smoking status: Never Smoker   . Smokeless tobacco: Never Used  . Alcohol Use: Yes     Comment: rarely    Review of Systems  Constitutional: Negative for appetite change and fatigue.  HENT: Negative for congestion, ear discharge and sinus pressure.        Bleeding tongue wound  Eyes: Negative for discharge.  Respiratory: Negative for  cough.   Cardiovascular: Negative for chest pain.  Gastrointestinal: Negative for abdominal pain and diarrhea.  Genitourinary: Negative for frequency and hematuria.  Musculoskeletal: Negative for back pain.  Skin: Negative for rash.  Neurological: Negative for seizures and headaches.  Psychiatric/Behavioral: Negative for hallucinations.    Allergies  Review of patient's allergies indicates no known allergies.  Home Medications   Prior to Admission medications   Medication Sig Start Date End Date Taking? Authorizing Provider  CIALIS 20 MG tablet TAKE 1 TABLET (20 MG TOTAL) BY MOUTH DAILY AS NEEDED. 02/02/14  Yes Tammi Sou, MD   diltiazem 2 % GEL Apply 1 application topically 3 (three) times daily. Apply into rectum to first knuckle with gloved finger 11/28/13  Yes Gatha Mayer, MD  Diltiazem HCl POWD APPLY INTO RECTUM TO FIRST KNUCKLE WITH GLOVED FINGER 3 TIMES A DAY 04/27/14  Yes Gatha Mayer, MD  diphenhydrAMINE (BENADRYL) 25 MG tablet Take 25 mg by mouth every 6 (six) hours as needed.   Yes Historical Provider, MD  EPINEPHrine (EPIPEN) 0.3 mg/0.3 mL DEVI Inject 0.3 mLs (0.3 mg total) into the muscle once. 12/07/11  Yes Tammi Sou, MD  fexofenadine-pseudoephedrine (ALLEGRA-D 24 HOUR) 180-240 MG per 24 hr tablet Take 1 tablet by mouth daily. 09/28/12  Yes Tammi Sou, MD  Testosterone (FORTESTA) 10 MG/ACT (2%) GEL Place 40 mg onto the skin daily. (2 pumps to each thigh) 03/08/14  Yes Tammi Sou, MD  UNABLE TO FIND Med Name: [facility] Massage blend oil, massage on neck  2-3 times a day PRN   Yes Historical Provider, MD  hydrocortisone (ANUSOL-HC) 25 MG suppository Use 1 suppositories at bedtime for 10 days 11/14/13   Amy S Esterwood, PA-C  lidocaine-hydrocortisone (ANAMANTEL HC) 3-0.5 % CREA Apply to external hemorrhoids 3-4 times daily. 11/14/13   Amy S Esterwood, PA-C   BP 155/93  Pulse 79  Temp(Src) 97.1 F (36.2 C) (Axillary)  Resp 20  Ht 5\' 11"  (1.803 m)  Wt 213 lb (96.616 kg)  BMI 29.72 kg/m2  SpO2 98% Physical Exam  Constitutional: He is oriented to person, place, and time. He appears well-developed.  HENT:  Head: Normocephalic.  Superficial fissure longitudinal down the tongue; bleeding 2cm posterior to the tip   Eyes: Conjunctivae and EOM are normal. No scleral icterus.  Neck: Neck supple. No thyromegaly present.  Cardiovascular: Normal rate and regular rhythm.  Exam reveals no gallop and no friction rub.   No murmur heard. Pulmonary/Chest: No stridor. He has no wheezes. He has no rales. He exhibits no tenderness.  Abdominal: He exhibits no distension. There is no tenderness. There is no  rebound.  Musculoskeletal: Normal range of motion. He exhibits no edema.  Lymphadenopathy:    He has no cervical adenopathy.  Neurological: He is oriented to person, place, and time. He exhibits normal muscle tone. Coordination normal.  Skin: No rash noted. No erythema.  Psychiatric: He has a normal mood and affect. His behavior is normal.    ED Course  Procedures   DIAGNOSTIC STUDIES: Oxygen Saturation is 98% on RA, normal by my interpretation.    COORDINATION OF CARE: 7:20 PM Discussed treatment plan with pt at bedside and pt agreed to plan.  LACERATION REPAIR Performed by: Tanna Furry, MD Consent: Verbal consent obtained. Risks and benefits: risks, benefits and alternatives were discussed Patient identity confirmed: provided demographic data Time out performed prior to procedure Prepped and Draped in normal sterile fashion Wound explored Laceration Location:  0.5 Laceration Length: 0.5cm No Foreign Bodies seen or palpated Anesthesia: local infiltration Local anesthetic: lidocaine 1% with epinephrine Anesthetic total: 2 ml Irrigation method: syringe Amount of cleaning: standard Skin closure: 4-0 Vicryl Number of sutures or staples: 2 Technique: 1 Horizontal mattress, 1 simple Patient tolerance: Patient tolerated the procedure well with no immediate complications.   Labs Review Labs Reviewed - No data to display  Imaging Review No results found.   EKG Interpretation None      MDM   Final diagnoses:  Tongue laceration, initial encounter    Should not need suture removal. Avoid aspirin. Recheck if any difficulties.  I personally performed the services described in this documentation, which was scribed in my presence. The recorded information has been reviewed and is accurate.      Tanna Furry, MD 06/30/14 2024

## 2014-06-30 NOTE — ED Notes (Signed)
Pt reports biting his tongue - reports it will not stop bleeding - pt reports taking Aspirin.

## 2014-06-30 NOTE — Discharge Instructions (Signed)
Sutures do not need to be removed. No aspirin.  Tongue Laceration  A tongue laceration is a cut on the tongue. The edges of the cut may turn gray. Most cuts on the tongue heal without problems. HOME CARE  Cover an ice cube with a thin cloth. Hold the ice cube on the cut for 1 to 3 minutes at a time, 6 to 10 times a day. Do this for 1 day.  After the first day, rinse your mouth with warm salt water. Do this 4 to 6 times a day, or as told by your doctor.  If your teeth are not injured, brush your teeth gently. Do not brush loose or broken teeth. Do not brush teeth that have been put back into place by your doctor or dentist.  If given, take your antibiotic medicine as told. Finish the medicine even if you start to feel better.  Do not eat hot foods or drink hot drinks when you lose feeling (numbness) in your mouth.  Do not eat hard foods (such as apples) or chewy foods (such as broiled meat) until your doctor says it is okay.  If you have stitches (sutures), do not pull or chew them.  Only take medicine as told by your doctor. You may need a tetanus shot if:  You cannot remember when you had your last tetanus shot.  You have never had a tetanus shot. If you need a tetanus shot and you choose not to have one, you may get tetanus. Sickness from tetanus can be serious. GET HELP RIGHT AWAY IF:  You have puffiness (swelling) or more pain in the tongue or other parts of your face.  You see yellowish-white fluid (pus) coming from the cut.  You have a fever.  You see the edges of the cut break open after your stitches are taken out.  You have bleeding that does not stop when you press on the area.  You have trouble breathing. MAKE SURE YOU:  Understand these instructions.  Will watch your condition.  Will get help right away if you are not doing well or get worse. Document Released: 11/09/2011 Document Reviewed: 11/09/2011 [facility] Patient Information 2015 [place]. This  information is not intended to replace advice given to you by your health care provider. Make sure you discuss any questions you have with your health care provider.

## 2014-07-03 ENCOUNTER — Ambulatory Visit (INDEPENDENT_AMBULATORY_CARE_PROVIDER_SITE_OTHER): Payer: 59 | Admitting: Internal Medicine

## 2014-07-03 ENCOUNTER — Encounter: Payer: Self-pay | Admitting: Internal Medicine

## 2014-07-03 VITALS — BP 136/78 | HR 80 | Ht 71.0 in | Wt 211.8 lb

## 2014-07-03 DIAGNOSIS — K648 Other hemorrhoids: Secondary | ICD-10-CM

## 2014-07-03 DIAGNOSIS — K602 Anal fissure, unspecified: Secondary | ICD-10-CM

## 2014-07-03 NOTE — Progress Notes (Signed)
Patient ID: Douglas [name], male   DOB: 1961-02-04, 53 y.o.   MRN: [id number]          Patient returns for f/u hemorrhoids and anal fissure. Fissure sxs gone after diltiazem gel and sitz baths. Has frequent mucoid discharged from anus and sxs of prolapsed hemorrhoids. Rare rectal bleeding. Bowel habits are regular    Rectal exam shows small right mid tag/pile and some spasm, stensosis, longer sphincter. No mass and not tender   PROCEDURE NOTE: The patient presents with symptomatic grade 2  hemorrhoids, requesting rubber band ligation of his/her hemorrhoidal disease.  All risks, benefits and alternative forms of therapy were described and informed consent was obtained.  In the Left Lateral Decubitus position anoscopic examination revealed grade 2 hemorrhoids in the all position(s).  The anorectum was pre-medicated with 0.125% NITG The decision was made to band the RP internal hemorrhoid, and the Springer was used to perform band ligation without complication.  Digital anorectal examination was then performed to assure proper positioning of the band, and to adjust the banded tissue as required.  The patient was discharged [name] without pain or other issues.  Dietary and behavioral recommendations were given and along with follow-up instructions.     The following adjunctive treatments were recommended:  Sitz bath prn  The patient will return in 2-3 weeks for  follow-up and possible additional banding as required. No complications were encountered and the patient tolerated the procedure well.   I appreciate the opportunity to care for this patient.  TS:[id number] H, MD

## 2014-07-03 NOTE — Patient Instructions (Signed)
HEMORRHOID BANDING PROCEDURE    FOLLOW-UP CARE   1. The procedure you have had should have been relatively painless since the banding of the area involved does not have nerve endings and there is no pain sensation.  The rubber band cuts off the blood supply to the hemorrhoid and the band may fall off as soon as 48 hours after the banding (the band may occasionally be seen in the toilet bowl following a bowel movement). You may notice a temporary feeling of fullness in the rectum which should respond adequately to plain Tylenol or Motrin.  2. Following the banding, avoid strenuous exercise that evening and resume full activity the next day.  A sitz bath (soaking in a warm tub) or bidet is soothing, and can be useful for cleansing the area after bowel movements.     3. To avoid constipation, take two tablespoons of natural wheat bran, natural oat bran, flax, Benefiber or any over the counter fiber supplement and increase your water intake to 7-8 glasses daily.    4. Unless you have been prescribed anorectal medication, do not put anything inside your rectum for two weeks: No suppositories, enemas, fingers, etc.  5. Occasionally, you may have more bleeding than usual after the banding procedure.  This is often from the untreated hemorrhoids rather than the treated one.  Don't be concerned if there is a tablespoon or so of blood.  If there is more blood than this, lie flat with your bottom higher than your head and apply an ice pack to the area. If the bleeding does not stop within a half an hour or if you feel faint, call our office at [phone] or go to the emergency room.  6. Problems are not common; however, if there is a substantial amount of bleeding, severe pain, chills, fever or difficulty passing urine (very rare) or other problems, you should call us at [phone] or report to the nearest emergency room.  7. Do not stay seated continuously for more than 2-3 hours for a day or two  after the procedure.  Tighten your buttock muscles 10-15 times every two hours and take 10-15 deep breaths every 1-2 hours.  Do not spend more than a few minutes on the toilet if you cannot empty your bowel; instead re-visit the toilet at a later time.    We will see you for your next appointment 07/20/14 at 3:30pm.   I appreciate the opportunity to care for you.

## 2014-07-03 NOTE — Assessment & Plan Note (Addendum)
No symptoms now after diltiazem gel

## 2014-07-03 NOTE — Assessment & Plan Note (Addendum)
Evaluated with anoscopy and has Grade II internal hemorrhoids all 3 positions. RP banded

## 2014-07-04 ENCOUNTER — Telehealth: Payer: Self-pay | Admitting: Family Medicine

## 2014-07-04 ENCOUNTER — Other Ambulatory Visit: Payer: Self-pay | Admitting: Family Medicine

## 2014-07-04 MED ORDER — BUDESONIDE-FORMOTEROL FUMARATE 160-4.5 MCG/ACT IN AERO: 2.0000 | INHALATION_SPRAY | Freq: Two times a day (BID) | RESPIRATORY_TRACT | Status: DC

## 2014-07-04 MED ORDER — ALBUTEROL SULFATE HFA 108 (90 BASE) MCG/ACT IN AERS: 2.0000 | INHALATION_SPRAY | Freq: Four times a day (QID) | RESPIRATORY_TRACT | Status: DC | PRN

## 2014-07-04 NOTE — Telephone Encounter (Signed)
Sent in Rx's for symbicort and ventolin.

## 2014-07-04 NOTE — Telephone Encounter (Signed)
Rx request for symbicort and ventolin.  These aren't in patient's current med list.  Please advise.

## 2014-07-20 ENCOUNTER — Ambulatory Visit (INDEPENDENT_AMBULATORY_CARE_PROVIDER_SITE_OTHER): Payer: 59 | Admitting: Internal Medicine

## 2014-07-20 ENCOUNTER — Encounter: Payer: Self-pay | Admitting: Internal Medicine

## 2014-07-20 VITALS — BP 140/84 | HR 84 | Ht 71.0 in | Wt 211.0 lb

## 2014-07-20 DIAGNOSIS — K648 Other hemorrhoids: Secondary | ICD-10-CM

## 2014-07-20 DIAGNOSIS — K602 Anal fissure, unspecified: Secondary | ICD-10-CM

## 2014-07-20 MED ORDER — DILTIAZEM GEL 2 %: 1.0000 "application " | Freq: Three times a day (TID) | CUTANEOUS | Status: DC

## 2014-07-20 NOTE — Patient Instructions (Signed)
HEMORRHOID BANDING PROCEDURE    FOLLOW-UP CARE   1. The procedure you have had should have been relatively painless since the banding of the area involved does not have nerve endings and there is no pain sensation.  The rubber band cuts off the blood supply to the hemorrhoid and the band may fall off as soon as 48 hours after the banding (the band may occasionally be seen in the toilet bowl following a bowel movement). You may notice a temporary feeling of fullness in the rectum which should respond adequately to plain Tylenol or Motrin.  2. Following the banding, avoid strenuous exercise that evening and resume full activity the next day.  A sitz bath (soaking in a warm tub) or bidet is soothing, and can be useful for cleansing the area after bowel movements.     3. To avoid constipation, take two tablespoons of natural wheat bran, natural oat bran, flax, Benefiber or any over the counter fiber supplement and increase your water intake to 7-8 glasses daily.    4. Unless you have been prescribed anorectal medication, do not put anything inside your rectum for two weeks: No suppositories, enemas, fingers, etc.  5. Occasionally, you may have more bleeding than usual after the banding procedure.  This is often from the untreated hemorrhoids rather than the treated one.  Don't be concerned if there is a tablespoon or so of blood.  If there is more blood than this, lie flat with your bottom higher than your head and apply an ice pack to the area. If the bleeding does not stop within a half an hour or if you feel faint, call our office at [phone] or go to the emergency room.  6. Problems are not common; however, if there is a substantial amount of bleeding, severe pain, chills, fever or difficulty passing urine (very rare) or other problems, you should call us at [phone] or report to the nearest emergency room.  7. Do not stay seated continuously for more than 2-3 hours for a day or two  after the procedure.  Tighten your buttock muscles 10-15 times every two hours and take 10-15 deep breaths every 1-2 hours.  Do not spend more than a few minutes on the toilet if you cannot empty your bowel; instead re-visit the toilet at a later time.    Follow up with Dr Carlean Purl as needed.  We have faxed a rx to [facility] for you to pick up for the Diltiazem gel, please use for another month per Dr Carlean Purl.   I appreciate the opportunity to care for you.

## 2014-07-20 NOTE — Progress Notes (Signed)
Patient ID: Douglas [name], male   DOB: Sep 23, 1960, 53 y.o.   MRN: [id number]        PROCEDURE NOTE: The patient presents with symptomatic grade 2  hemorrhoids, requesting rubber band ligation of his/her hemorrhoidal disease.  All risks, benefits and alternative forms of therapy were described and informed consent was obtained.  Evidence of mild recurrence of posterior anal fissure seen on DRE.  The anorectum was pre-medicated with 0.125% NTG and 5% liodcaine The decision was made to band the LL and RA internal hemorrhoids, and the [place] was used to perform band ligation without complication.  Digital anorectal examination was then performed to assure proper positioning of the band, and to adjust the banded tissue as required.  The patient was discharged [name] without pain or other issues.  Dietary and behavioral recommendations were given and along with follow-up instructions.     The following adjunctive treatments were recommended:  Resume diltiazem gel for fissure Fiber supplements   The patient will return  prn for  follow-up and possible additional banding as required. No complications were encountered and the patient tolerated the procedure well.

## 2014-07-20 NOTE — Assessment & Plan Note (Signed)
Mildly symptomatic again Restart diltiazem 1-2 months

## 2014-07-20 NOTE — Assessment & Plan Note (Signed)
RA and LL banded F/U prn

## 2014-07-23 ENCOUNTER — Ambulatory Visit (INDEPENDENT_AMBULATORY_CARE_PROVIDER_SITE_OTHER): Payer: 59 | Admitting: Family Medicine

## 2014-07-23 ENCOUNTER — Encounter: Payer: Self-pay | Admitting: Family Medicine

## 2014-07-23 VITALS — BP 148/91 | HR 86 | Temp 98.7°F | Resp 18 | Ht 70.0 in | Wt 208.0 lb

## 2014-07-23 DIAGNOSIS — Z23 Encounter for immunization: Secondary | ICD-10-CM

## 2014-07-23 DIAGNOSIS — J4541 Moderate persistent asthma with (acute) exacerbation: Secondary | ICD-10-CM

## 2014-07-23 MED ORDER — METHYLPREDNISOLONE ACETATE 80 MG/ML IJ SUSP
80.0000 mg | Freq: Once | INTRAMUSCULAR | Status: AC
  Administered 2014-07-23: 80 mg via INTRAMUSCULAR

## 2014-07-23 MED ORDER — BUDESONIDE-FORMOTEROL FUMARATE 160-4.5 MCG/ACT IN AERO: 2.0000 | INHALATION_SPRAY | Freq: Two times a day (BID) | RESPIRATORY_TRACT | Status: DC

## 2014-07-23 MED ORDER — ALBUTEROL SULFATE HFA 108 (90 BASE) MCG/ACT IN AERS: 2.0000 | INHALATION_SPRAY | Freq: Four times a day (QID) | RESPIRATORY_TRACT | Status: DC | PRN

## 2014-07-23 NOTE — Progress Notes (Signed)
OFFICE NOTE  07/23/2014  CC:  Chief Complaint  Patient presents with  . Cough     HPI: Patient is a 53 y.o. Caucasian male who is here for cough.  PMH asthma. Has been out of inhalers for about 4 mo, says when he ran out he felt like he was doing fine on just allegra alone. Onset of dry cough last night.  Some chest tightness and wheezing.  No fever.  No nasal congestion or runny nose or ST.   Pertinent PMH:  Past medical, surgical, social, and family history reviewed and no changes are noted since last office visit.  MEDS:  Outpatient Prescriptions Prior to Visit  Medication Sig Dispense Refill  . albuterol (VENTOLIN HFA) 108 (90 BASE) MCG/ACT inhaler Inhale 2 puffs into the lungs every 6 (six) hours as needed for wheezing. 1 Inhaler 0  . budesonide-formoterol (SYMBICORT) 160-4.5 MCG/ACT inhaler Inhale 2 puffs into the lungs 2 (two) times daily. 1 Inhaler 6  . CIALIS 20 MG tablet Take 20 mg by mouth as needed.     . diltiazem 2 % GEL Apply 1 application topically 3 (three) times daily. Apply into rectum to first knuckle with gloved finger 30 g 2  . Diltiazem HCl POWD APPLY INTO RECTUM TO FIRST KNUCKLE WITH GLOVED FINGER 3 TIMES A DAY 30 g 0  . EPINEPHrine (EPIPEN) 0.3 mg/0.3 mL DEVI Inject 0.3 mLs (0.3 mg total) into the muscle once. 1 Device 3  . fexofenadine-pseudoephedrine (ALLEGRA-D 24 HOUR) 180-240 MG per 24 hr tablet Take 1 tablet by mouth daily. 90 tablet 1  . Testosterone (FORTESTA) 10 MG/ACT (2%) GEL Place 40 mg onto the skin daily. (2 pumps to each thigh) 60 g 5  . UNABLE TO FIND Med Name: Doterra Massage blend oil, massage on neck  2-3 times a day PRN     No facility-administered medications prior to visit.    PE: Blood pressure 148/91, pulse 86, temperature 98.7 F (37.1 C), temperature source Temporal, resp. rate 18, height 5\' 10"  (1.778 m), weight 208 lb (94.348 kg), SpO2 96 %. Gen: Alert, well appearing.  Patient is oriented to person, place, time, and  situation. ENT: Ears: EACs clear, normal epithelium.  TMs with good light reflex and landmarks bilaterally.  Eyes: no injection, icteris, swelling, or exudate.  EOMI, PERRLA. Nose: no drainage or turbinate edema/swelling.  No injection or focal lesion.  Mouth: lips without lesion/swelling.  Oral mucosa pink and moist.  Dentition intact and without obvious caries or gingival swelling.  Oropharynx without erythema, exudate, or swelling.  Neck - No masses or thyromegaly or limitation in range of motion CV: RRR, no m/r/g.   LUNGS: CTA bilat, nonlabored resps, good aeration in all lung fields.  With forced exhalation he does have a slight bit if end exp wheezing but no airflow limitation and no post-exhalation coughing.   IMPRESSION AND PLAN:  Asthma, poor control vs early mild flare. Depo medrol 80mg  IM today. Restart symbicort 160/4.5, 2 puffs bid.  RF'd ventolin with 1 additional RF. Flu vaccine IM today.  An After Visit Summary was printed and given to the patient.  FOLLOW UP: 4-6 mo, f/u asthma.

## 2014-10-01 ENCOUNTER — Telehealth: Payer: Self-pay | Admitting: Family Medicine

## 2014-10-01 MED ORDER — TESTOSTERONE 10 MG/ACT (2%) TD GEL: 40.0000 mg | Freq: Every day | TRANSDERMAL | Status: DC

## 2014-10-01 NOTE — Telephone Encounter (Signed)
Testost rx printed.

## 2014-10-01 NOTE — Telephone Encounter (Signed)
Faxed rx

## 2014-10-01 NOTE — Telephone Encounter (Signed)
Pt requesting rf of testosterone gel to be sent to [place].  Last OV was 07/23/14.  Last RX was 03/08/14 x5 rfs.  Please advise.

## 2015-02-19 ENCOUNTER — Ambulatory Visit: Payer: Self-pay | Admitting: Gastroenterology

## 2015-04-10 ENCOUNTER — Other Ambulatory Visit: Payer: Self-pay | Admitting: *Deleted

## 2015-04-10 MED ORDER — TESTOSTERONE 10 MG/ACT (2%) TD GEL: 40.0000 mg | Freq: Every day | TRANSDERMAL | Status: DC

## 2015-04-10 NOTE — Telephone Encounter (Signed)
RF request for testosterone LOV: 07/23/14 Next ov: None Last written: 10/01/14 60g w/ 5RF Please advise. Thanks.

## 2015-04-10 NOTE — Telephone Encounter (Signed)
I will do rx for testosteone for 1 mo + 1 additional month, but he is overdue for routine f/u for being on this med--needs o/v and labs before he runs out of these RF's.-thx

## 2015-04-10 NOTE — Telephone Encounter (Signed)
Left detailed message on home vm, okay per DPR.  

## 2015-06-03 ENCOUNTER — Encounter: Payer: Self-pay | Admitting: Family Medicine

## 2015-06-03 ENCOUNTER — Ambulatory Visit (INDEPENDENT_AMBULATORY_CARE_PROVIDER_SITE_OTHER): Payer: 59 | Admitting: Family Medicine

## 2015-06-03 VITALS — BP 131/88 | HR 83 | Temp 98.0°F | Resp 16 | Ht 70.0 in | Wt 199.0 lb

## 2015-06-03 DIAGNOSIS — H538 Other visual disturbances: Secondary | ICD-10-CM | POA: Diagnosis not present

## 2015-06-03 DIAGNOSIS — E291 Testicular hypofunction: Secondary | ICD-10-CM | POA: Diagnosis not present

## 2015-06-03 DIAGNOSIS — Z1159 Encounter for screening for other viral diseases: Secondary | ICD-10-CM

## 2015-06-03 DIAGNOSIS — R7989 Other specified abnormal findings of blood chemistry: Secondary | ICD-10-CM | POA: Diagnosis not present

## 2015-06-03 DIAGNOSIS — Z131 Encounter for screening for diabetes mellitus: Secondary | ICD-10-CM

## 2015-06-03 DIAGNOSIS — Z13220 Encounter for screening for lipoid disorders: Secondary | ICD-10-CM | POA: Diagnosis not present

## 2015-06-03 DIAGNOSIS — J4530 Mild persistent asthma, uncomplicated: Secondary | ICD-10-CM

## 2015-06-03 LAB — CBC WITH DIFFERENTIAL/PLATELET
Basophils Absolute: 0.1 10*3/uL (ref 0.0–0.1)
Basophils Relative: 1.6 % (ref 0.0–3.0)
EOS ABS: 0.2 10*3/uL (ref 0.0–0.7)
EOS PCT: 2.2 % (ref 0.0–5.0)
HCT: 45.6 % (ref 39.0–52.0)
HEMOGLOBIN: 15.4 g/dL (ref 13.0–17.0)
LYMPHS ABS: 2.6 10*3/uL (ref 0.7–4.0)
Lymphocytes Relative: 32.1 % (ref 12.0–46.0)
MCHC: 33.8 g/dL (ref 30.0–36.0)
MCV: 82 fl (ref 78.0–100.0)
MONO ABS: 0.3 10*3/uL (ref 0.1–1.0)
Monocytes Relative: 4.3 % (ref 3.0–12.0)
NEUTROS PCT: 59.8 % (ref 43.0–77.0)
Neutro Abs: 4.8 10*3/uL (ref 1.4–7.7)
Platelets: 226 10*3/uL (ref 150.0–400.0)
RBC: 5.56 Mil/uL (ref 4.22–5.81)
RDW: 13.8 % (ref 11.5–15.5)
WBC: 8 10*3/uL (ref 4.0–10.5)

## 2015-06-03 LAB — COMPREHENSIVE METABOLIC PANEL
ALK PHOS: 87 U/L (ref 39–117)
ALT: 14 U/L (ref 0–53)
AST: 9 U/L (ref 0–37)
Albumin: 4.2 g/dL (ref 3.5–5.2)
BILIRUBIN TOTAL: 0.4 mg/dL (ref 0.2–1.2)
BUN: 13 mg/dL (ref 6–23)
CALCIUM: 9.6 mg/dL (ref 8.4–10.5)
CO2: 28 meq/L (ref 19–32)
Chloride: 99 mEq/L (ref 96–112)
Creatinine, Ser: 0.66 mg/dL (ref 0.40–1.50)
GFR: 132.63 mL/min (ref >60.00)
Glucose, Bld: 246 mg/dL — ABNORMAL HIGH (ref 70–99)
POTASSIUM: 4.1 meq/L (ref 3.5–5.1)
Sodium: 137 mEq/L (ref 135–145)
TOTAL PROTEIN: 6.8 g/dL (ref 6.0–8.3)

## 2015-06-03 LAB — LIPID PANEL
CHOLESTEROL: 217 mg/dL — AB (ref 0–200)
HDL: 29.5 mg/dL — AB (ref >39.00)
Total CHOL/HDL Ratio: 7
Triglycerides: 1315 mg/dL — ABNORMAL HIGH (ref 0.0–149.0)

## 2015-06-03 LAB — PSA: PSA: 0.89 ng/mL (ref 0.10–4.00)

## 2015-06-03 LAB — HEMOGLOBIN A1C: HEMOGLOBIN A1C: 12.1 % — AB (ref 4.6–6.5)

## 2015-06-03 LAB — TESTOSTERONE: Testosterone: 228.54 ng/dL — ABNORMAL LOW (ref 300.00–890.00)

## 2015-06-03 MED ORDER — TADALAFIL 20 MG PO TABS: 20.0000 mg | ORAL_TABLET | ORAL | Status: DC | PRN

## 2015-06-03 MED ORDER — TESTOSTERONE 10 MG/ACT (2%) TD GEL: 40.0000 mg | Freq: Every day | TRANSDERMAL | Status: DC

## 2015-06-03 NOTE — Progress Notes (Signed)
OFFICE VISIT  06/03/2015   CC:  Chief Complaint  Patient presents with  . Follow-up  . Eye Problem   HPI:    Patient is a 54 y.o. Caucasian male who presents for "follow up".  I last saw him about 11 months ago. Asks for RFs on cialis and testost.  Last applied testost about 2 hours ago.  Says testosterone is helping with libido and energy.  Cialis helps with ED.  Has recent visual acuity complaints, has had to get 3 different eye glasses rx's in last 5 mo.  Says eye MD told him to come see me about his eyes, presumably b/c of possible need for diabetes testing (? Lens swelling due to hyperglycemia?). Last ate about 12 hours ago. Denies polyuria or polydipsia.   Currently not "trying" to lose weight: he is down 9 lbs since last visit here 11 mo ago.  Denies any breathing problems, cough, or wheezing lately.  Not using any inhalers lately.  Past Medical History  Diagnosis Date  . Depression     Prozac in the past not much help.  Spontaneously resolved.  . Adenomatous colon polyp 05/07/06; 05/2012    Repeat colonoscopy 05/17/2012 showed 2 small polyps that were removed--path showed Hyperplastic-not adenomatous.  Still needs repeat 5 yrs.  . Diverticulosis   . Hypogonadism male     Felt improved on andogel but testost levels did not improve.  . Rotator cuff tear 2011    [facility]: conservative mgmt--symptoms stable as of 04/2011.  . 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  . Insomnia   . Hypertriglyceridemia     fish oil OTC  . Angioedema of lips     saw allergist 11/2011  . Hemorrhoids     ext and int  . Generalized anxiety disorder 08/11/2012  . Chronic recurrent sinusitis 02/28/2012  . Asthmatic bronchitis , chronic ([place]) 01/11/2012    Question occupational asthma with no specific agents identified. He does notice that some areas of his job are particularly dusty and dirty. Currently he is well  controlled using Symbicort through an AeroChamber and rarely needing his rescue inhaler. This would be an acceptable long-term status. Elevated nonspecific total IgE and peripheral eosinophilia to suggest an atopic trigger with sensitization to something Consider Daliresp for future use if needed   . Hypogonadism, male 04/08/2011    Several time has been <100 (saw [facility] in 2011 for this, w/u for secondary hypogonadism was unremarkable), most recent testosterone check via [facility] 10/2010 was 1.15 ng/ml.   . Internal hemorrhoids, bleeding and sicharge 05/29/2013    Past Surgical History  Procedure Laterality Date  . Adnoids    . Wisdom tooth extraction    . Colonoscopy  05/2006, 05/2012  . Polypectomy      Adenomatous 2007; hyperplastic 2013.  Repeat 5 yrs.  . Hemorrhoid banding      Outpatient Prescriptions Prior to Visit  Medication Sig Dispense Refill  . EPINEPHrine (EPIPEN) 0.3 mg/0.3 mL DEVI Inject 0.3 mLs (0.3 mg total) into the muscle once. 1 Device 3  . fexofenadine-pseudoephedrine (ALLEGRA-D 24 HOUR) 180-240 MG per 24 hr tablet Take 1 tablet by mouth daily. 90 tablet 1  . CIALIS 20 MG tablet Take 20 mg by mouth as needed.     . Testosterone (FORTESTA) 10 MG/ACT (2%) GEL Place 40 mg onto the skin daily. (2 pumps to each thigh)  60 g 1  . albuterol (VENTOLIN HFA) 108 (90 BASE) MCG/ACT inhaler Inhale 2 puffs into the lungs every 6 (six) hours as needed for wheezing. (Patient not taking: Reported on 06/03/2015) 1 Inhaler 1  . budesonide-formoterol (SYMBICORT) 160-4.5 MCG/ACT inhaler Inhale 2 puffs into the lungs 2 (two) times daily. (Patient not taking: Reported on 06/03/2015) 1 Inhaler 11  . diltiazem 2 % GEL Apply 1 application topically 3 (three) times daily. Apply into rectum to first knuckle with gloved finger (Patient not taking: Reported on 06/03/2015) 30 g 2  . Diltiazem HCl POWD APPLY INTO RECTUM TO FIRST KNUCKLE WITH GLOVED FINGER 3 TIMES A DAY (Patient not taking: Reported on  06/03/2015) 30 g 0  . UNABLE TO FIND Med Name: Doterra Massage blend oil, massage on neck  2-3 times a day PRN     No facility-administered medications prior to visit.    Allergies  Allergen Reactions  . Molds & Smuts Other (See Comments)    ROS As per HPI  PE: Blood pressure 131/88, pulse 83, temperature 98 F (36.7 C), temperature source Oral, resp. rate 16, height 5\' 10"  (1.778 m), weight 199 lb (90.266 kg), SpO2 96 %. Gen: Alert, well appearing.  Patient is oriented to person, place, time, and situation. Rectal exam: negative without mass, lesions or tenderness, PROSTATE EXAM: smooth and symmetric without nodules or tenderness.   LABS:  Lab Results  Component Value Date   TESTOSTERONE 120* 03/08/2014   Lab Results  Component Value Date   WBC 6.2 03/08/2014   HGB 13.7 03/08/2014   HCT 40.6 03/08/2014   MCV 84.0 03/08/2014   PLT 250.0 03/08/2014   Lab Results  Component Value Date   PSA 1.06 09/13/2013     Chemistry      Component Value Date/Time   NA 140 08/06/2011 0920   K 4.2 08/06/2011 0920   CL 103 08/06/2011 0920   CO2 29 08/06/2011 0920   BUN 13 08/06/2011 0920   CREATININE 0.8 08/06/2011 0920      Component Value Date/Time   CALCIUM 9.4 08/06/2011 0920   ALKPHOS 64 08/06/2011 0920   AST 22 08/06/2011 0920   ALT 30 08/06/2011 0920   BILITOT 0.8 08/06/2011 0920     Lab Results  Component Value Date   CHOL 150 08/06/2011   HDL 33.30* 08/06/2011   LDLDIRECT 60.2 08/06/2011   TRIG 352.0* 08/06/2011   CHOLHDL 5 08/06/2011   Lab Results  Component Value Date   TSH 1.02 09/30/2011   IMPRESSION AND PLAN:  1) Male hypogonadism; due for monitoring of testost level, PSA, CBC today. DRE normal. RF'd testost and cialis rx's today.  2) Recurrent asthmatic bronchitis: doing well lately on no preventative meds, needing no rescue inhaler. He declined flu vaccine today.  3) Visual acuity problems: keep f/u appts with eye MD. Will screen for DM here  today with fasting gluc and HbA1c.  4) Hep C screening: pt said he wanted to get this screening test today.  5) Hx of hypertrig and low HDL: recheck FLP today.  An After Visit Summary was printed and given to the patient.  FOLLOW UP: Return in about 6 months (around 12/02/2015) for routine chronic illness f/u.

## 2015-06-03 NOTE — Progress Notes (Signed)
Pre visit review using our clinic review tool, if applicable. No additional management support is needed unless otherwise documented below in the visit note. 

## 2015-06-04 LAB — LDL CHOLESTEROL, DIRECT: LDL DIRECT: 52 mg/dL

## 2015-06-04 LAB — HEPATITIS C ANTIBODY: HCV AB: NEGATIVE

## 2015-06-05 ENCOUNTER — Other Ambulatory Visit: Payer: Self-pay | Admitting: Family Medicine

## 2015-06-05 MED ORDER — METFORMIN HCL 500 MG PO TABS: ORAL_TABLET | ORAL | Status: DC

## 2015-06-06 ENCOUNTER — Encounter: Payer: Self-pay | Admitting: Family Medicine

## 2015-06-07 ENCOUNTER — Ambulatory Visit: Payer: 59 | Admitting: Family Medicine

## 2015-06-10 ENCOUNTER — Ambulatory Visit (INDEPENDENT_AMBULATORY_CARE_PROVIDER_SITE_OTHER): Payer: 59 | Admitting: Family Medicine

## 2015-06-10 ENCOUNTER — Encounter: Payer: Self-pay | Admitting: Family Medicine

## 2015-06-10 VITALS — BP 134/87 | HR 59 | Temp 98.6°F | Resp 16 | Ht 70.0 in | Wt 201.0 lb

## 2015-06-10 DIAGNOSIS — E781 Pure hyperglyceridemia: Secondary | ICD-10-CM

## 2015-06-10 DIAGNOSIS — Z23 Encounter for immunization: Secondary | ICD-10-CM

## 2015-06-10 DIAGNOSIS — E119 Type 2 diabetes mellitus without complications: Secondary | ICD-10-CM | POA: Diagnosis not present

## 2015-06-10 LAB — GLUCOSE, POCT (MANUAL RESULT ENTRY): POC Glucose: 201 mg/dl — AB (ref 70–99)

## 2015-06-10 MED ORDER — GLUCOSE BLOOD VI STRP: ORAL_STRIP | Status: DC

## 2015-06-10 MED ORDER — INSULIN GLARGINE 100 UNIT/ML SOLOSTAR PEN: PEN_INJECTOR | SUBCUTANEOUS | Status: DC

## 2015-06-10 NOTE — Addendum Note (Signed)
Addended by: Lanae Crumbly on: 06/10/2015 08:49 AM   Modules accepted: Orders

## 2015-06-10 NOTE — Progress Notes (Signed)
Pre visit review using our clinic review tool, if applicable. No additional management support is needed unless otherwise documented below in the visit note. 

## 2015-06-10 NOTE — Progress Notes (Signed)
OFFICE VISIT  06/10/2015   CC:  Chief Complaint  Patient presents with  . Follow-up    DM, pt is fasting   HPI:    Patient is a 54 y.o. Caucasian male who presents for recently dx'd DM 2.  He was noticing blurring of vision, Eye MD visits showed eyes ok, we checked gluc and A1c and both high (see labs below).  About 5 d/a he got started on metformin and I wanted him to come today for DM education and discuss starting long acting insulin--at least short term.    Recent triglycerides were also very high, but I chose not to start him on any med at that moment b/c I wanted him to start DM med first.    Pt with no new complaints or questions today. He has just started his metformin at 500 mg bid dosing.  Past Medical History  Diagnosis Date  . Depression     Prozac in the past not much help.  Spontaneously resolved.  . Adenomatous colon polyp 05/07/06; 05/2012    Repeat colonoscopy 05/17/2012 showed 2 small polyps that were removed--path showed Hyperplastic-not adenomatous.  Still needs repeat 5 yrs.  . Diverticulosis   . Hypogonadism male     Felt improved on andogel but testost levels did not improve.  . Rotator cuff tear 2011    [facility]: conservative mgmt--symptoms stable as of 04/2011.  . 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  . Insomnia   . Hypertriglyceridemia     fish oil OTC  . Angioedema of lips     saw allergist 11/2011  . Hemorrhoids     ext and int  . Generalized anxiety disorder 08/11/2012  . Chronic recurrent sinusitis 02/28/2012  . Asthmatic bronchitis , chronic ([place]) 01/11/2012    Question occupational asthma with no specific agents identified. He does notice that some areas of his job are particularly dusty and dirty. Currently he is well controlled using Symbicort through an AeroChamber and rarely needing his rescue inhaler. This would be an acceptable long-term status. Elevated  nonspecific total IgE and peripheral eosinophilia to suggest an atopic trigger with sensitization to something Consider Daliresp for future use if needed   . Hypogonadism, male 04/08/2011    Several time has been <100 (saw [facility] in 2011 for this, w/u for secondary hypogonadism was unremarkable), most recent testosterone check via [facility] 10/2010 was 1.15 ng/ml.   . Internal hemorrhoids, bleeding and sicharge 05/29/2013  . Diabetes mellitus without complication [facility])     Past Surgical History  Procedure Laterality Date  . Adnoids    . Wisdom tooth extraction    . Colonoscopy  05/2006, 05/2012  . Polypectomy      Adenomatous 2007; hyperplastic 2013.  Repeat 5 yrs.  . Hemorrhoid banding      Outpatient Prescriptions Prior to Visit  Medication Sig Dispense Refill  . EPINEPHrine (EPIPEN) 0.3 mg/0.3 mL DEVI Inject 0.3 mLs (0.3 mg total) into the muscle once. 1 Device 3  . fexofenadine-pseudoephedrine (ALLEGRA-D 24 HOUR) 180-240 MG per 24 hr tablet Take 1 tablet by mouth daily. 90 tablet 1  . metFORMIN (GLUCOPHAGE) 500 MG tablet 1 tab po bid x 7d, then increase to 2 tabs po bid 53 tablet 0  . tadalafil (CIALIS) 20 MG tablet Take 1 tablet (20 mg total) by mouth as needed. 10 tablet 12  . Testosterone (FORTESTA)  10 MG/ACT (2%) GEL Place 40 mg onto the skin daily. (2 pumps to each thigh) 60 g 5   No facility-administered medications prior to visit.    Allergies  Allergen Reactions  . Molds & Smuts Other (See Comments)    ROS As per HPI  PE: Blood pressure 134/87, pulse 59, temperature 98.6 F (37 C), temperature source Oral, resp. rate 16, height 5\' 10"  (1.778 m), weight 201 lb (91.173 kg), SpO2 97 %. Gen: Alert, well appearing.  Patient is oriented to person, place, time, and situation. AFFECT: pleasant, lucid thought and speech. No further exam today.  LABS:    Chemistry      Component Value Date/Time   NA 137 06/03/2015 1140   K 4.1 06/03/2015 1140   CL 99 06/03/2015  1140   CO2 28 06/03/2015 1140   BUN 13 06/03/2015 1140   CREATININE 0.66 06/03/2015 1140      Component Value Date/Time   CALCIUM 9.6 06/03/2015 1140   ALKPHOS 87 06/03/2015 1140   AST 9 06/03/2015 1140   ALT 14 06/03/2015 1140   BILITOT 0.4 06/03/2015 1140    Glucose was 246 on 06/03/15.  Lab Results  Component Value Date   HGBA1C 12.1* 06/03/2015   Lab Results  Component Value Date   CHOL 217* 06/03/2015   HDL 29.50* 06/03/2015   LDLDIRECT 52.0 06/03/2015   TRIG * 06/03/2015    1315.0 Triglyceride is over 400; calculations on Lipids are invalid.   CHOLHDL 7 06/03/2015   Glucose this morning in office: [place]:  1) Newly diagnosed DM 2:  He'll be increasing his metforming to 1000 mg bid in 4d. Start lantus 10 U qhs now.  Therapeutic expectations and side effect profile of medication discussed today.  Patient's questions answered. Start glucose checks bid (fasting and one two hour PP) and write these down for review at next f/u. Refer to nutritionist ASAP. He is going to start exercising again.  2) Hypertriglyceridemia: these will come down quite a bit as his DM comes into control. Discussed risks of very high trigs, including pancreatitis, and pt is ok with plan of no meds at this time. We'll recheck lipids when diabetes better controlled, address treatment again at that time.  3) Prev health care: flu vaccine today.  An After Visit Summary was printed and given to the patient.  FOLLOW UP: Return in about 2 weeks (around 06/24/2015) for f/u DM 2.

## 2015-06-11 ENCOUNTER — Other Ambulatory Visit: Payer: Self-pay | Admitting: *Deleted

## 2015-06-11 ENCOUNTER — Telehealth: Payer: Self-pay | Admitting: *Deleted

## 2015-06-11 MED ORDER — PEN NEEDLES 31G X 6 MM MISC: 1.0000 | Freq: Every day | Status: DC

## 2015-06-11 NOTE — Telephone Encounter (Signed)
Pt LMOM on 06/11/15 at 11:39am.  He is requesting Rx for pen needles, Rx sent to pharmacy. Pt also mentioned that lancets are not working well. Tried calling pt did not answer a child answered per child pt was not available advised child to have pt call back.

## 2015-06-12 NOTE — Telephone Encounter (Signed)
Left message for pt to call back  °

## 2015-06-14 ENCOUNTER — Ambulatory Visit: Payer: 59

## 2015-06-14 ENCOUNTER — Ambulatory Visit: Payer: 59 | Admitting: Family Medicine

## 2015-06-14 NOTE — Telephone Encounter (Signed)
Spoke to pt, he stated that his glucose meter keeps giving him error 5 codes. I recommended that he come in for nurse visit with his glucose meter so we can see how he checks his BS and see if we can determine if it is pt error or the glucometer. Pt stated that he is going to try something else and if that doesn't work he will call for nurse visit.

## 2015-06-18 ENCOUNTER — Encounter: Payer: 59 | Attending: Family Medicine

## 2015-06-18 VITALS — Ht 70.0 in | Wt 193.9 lb

## 2015-06-18 DIAGNOSIS — Z713 Dietary counseling and surveillance: Secondary | ICD-10-CM | POA: Diagnosis not present

## 2015-06-18 DIAGNOSIS — E119 Type 2 diabetes mellitus without complications: Secondary | ICD-10-CM | POA: Diagnosis not present

## 2015-06-18 DIAGNOSIS — Z794 Long term (current) use of insulin: Secondary | ICD-10-CM | POA: Diagnosis not present

## 2015-06-19 NOTE — Progress Notes (Signed)

## 2015-06-25 DIAGNOSIS — E119 Type 2 diabetes mellitus without complications: Secondary | ICD-10-CM | POA: Diagnosis not present

## 2015-06-25 NOTE — Progress Notes (Signed)

## 2015-06-26 ENCOUNTER — Encounter: Payer: Self-pay | Admitting: Family Medicine

## 2015-06-26 ENCOUNTER — Ambulatory Visit (INDEPENDENT_AMBULATORY_CARE_PROVIDER_SITE_OTHER): Payer: 59 | Admitting: Family Medicine

## 2015-06-26 VITALS — BP 136/84 | HR 61 | Temp 98.4°F | Resp 16 | Ht 70.0 in | Wt 195.0 lb

## 2015-06-26 DIAGNOSIS — E119 Type 2 diabetes mellitus without complications: Secondary | ICD-10-CM | POA: Diagnosis not present

## 2015-06-26 DIAGNOSIS — E781 Pure hyperglyceridemia: Secondary | ICD-10-CM | POA: Diagnosis not present

## 2015-06-26 MED ORDER — METFORMIN HCL 1000 MG PO TABS: 1000.0000 mg | ORAL_TABLET | Freq: Two times a day (BID) | ORAL | Status: DC

## 2015-06-26 NOTE — Progress Notes (Signed)
OFFICE VISIT  06/26/2015   CC:  Chief Complaint  Patient presents with  . Follow-up    DM, pt is fasting.    HPI:    Patient is a 54 y.o. Caucasian male who presents for 2 week f/u newly diagnosed DM 2. Metforming and lantus rx'd at time of dx, home glucose monitoring arranged. Nutritionist referral ordered.  Has been taking metformin 1000 mg bid but never started his lantus insullin.  Went to nutritionist. Exercising more.  Reviewed home glucose ledger and avg appears to be around 140!  Lowest 100, highest 300 but only once since last visit.  Says vision blurry c/o is "about the same"--seems to be more intermittent now.  No burning, tingliing, or numbness in feet.  He is taking Krill oil and eating more fish in diet for his recently detected very high trigs. Denies abd pain, n/v.    Past Medical History  Diagnosis Date  . Depression     Prozac in the past not much help.  Spontaneously resolved.  . Adenomatous colon polyp 05/07/06; 05/2012    Repeat colonoscopy 05/17/2012 showed 2 small polyps that were removed--path showed Hyperplastic-not adenomatous.  Still needs repeat 5 yrs.  . Diverticulosis   . Hypogonadism male     Felt improved on andogel but testost levels did not improve.  . Rotator cuff tear 2011    [facility]: conservative mgmt--symptoms stable as of 04/2011.  . 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  . Insomnia   . Hypertriglyceridemia     fish oil OTC  . Angioedema of lips     saw allergist 11/2011  . Hemorrhoids     ext and int  . Generalized anxiety disorder 08/11/2012  . Chronic recurrent sinusitis 02/28/2012  . Asthmatic bronchitis , chronic ([place]) 01/11/2012    Question occupational asthma with no specific agents identified. He does notice that some areas of his job are particularly dusty and dirty. Currently he is well controlled using Symbicort through an AeroChamber and  rarely needing his rescue inhaler. This would be an acceptable long-term status. Elevated nonspecific total IgE and peripheral eosinophilia to suggest an atopic trigger with sensitization to something Consider Daliresp for future use if needed   . Hypogonadism, male 04/08/2011    Several time has been <100 (saw [facility] in 2011 for this, w/u for secondary hypogonadism was unremarkable), most recent testosterone check via [facility] 10/2010 was 1.15 ng/ml.   . Internal hemorrhoids, bleeding and sicharge 05/29/2013  . Diabetes mellitus without complication [facility])     Past Surgical History  Procedure Laterality Date  . Adnoids    . Wisdom tooth extraction    . Colonoscopy  05/2006, 05/2012  . Polypectomy      Adenomatous 2007; hyperplastic 2013.  Repeat 5 yrs.  . Hemorrhoid banding      Outpatient Prescriptions Prior to Visit  Medication Sig Dispense Refill  . EPINEPHrine (EPIPEN) 0.3 mg/0.3 mL DEVI Inject 0.3 mLs (0.3 mg total) into the muscle once. 1 Device 3  . fexofenadine-pseudoephedrine (ALLEGRA-D 24 HOUR) 180-240 MG per 24 hr tablet Take 1 tablet by mouth daily. 90 tablet 1  . glucose blood test strip Check glucose bid 100 each 12  . Insulin Pen Needle (PEN NEEDLES) 31G X 6 MM MISC 1 Device by Does not apply route daily. 50 each 11  . tadalafil (CIALIS) 20 MG tablet Take  1 tablet (20 mg total) by mouth as needed. 10 tablet 12  . Testosterone (FORTESTA) 10 MG/ACT (2%) GEL Place 40 mg onto the skin daily. (2 pumps to each thigh) 60 g 5  . Insulin Glargine (LANTUS SOLOSTAR) 100 UNIT/ML Solostar Pen 10 U SQ qhs 15 mL 3  . metFORMIN (GLUCOPHAGE) 500 MG tablet 1 tab po bid x 7d, then increase to 2 tabs po bid 53 tablet 0   No facility-administered medications prior to visit.    Allergies  Allergen Reactions  . Molds & Smuts Other (See Comments)    ROS As per HPI  PE: Blood pressure 136/84, pulse 61, temperature 98.4 F (36.9 C), temperature source Oral, resp. rate 16, height 5'  10" (1.778 m), weight 195 lb (88.451 kg), SpO2 96 %. Repeat BP today was 136/84 Gen: Alert, well appearing.  Patient is oriented to person, place, time, and situation. Foot exam - both sides normal; no swelling, tenderness or skin or vascular lesions. Color and temperature is normal. Sensation is intact. Peripheral pulses are palpable. Toenails are normal.   LABS:  Lab Results  Component Value Date   HGBA1C 12.1* 06/03/2015   Lab Results  Component Value Date   CHOL 217* 06/03/2015   HDL 29.50* 06/03/2015   LDLDIRECT 52.0 06/03/2015   TRIG * 06/03/2015    1315.0 Triglyceride is over 400; calculations on Lipids are invalid.   CHOLHDL 7 06/03/2015    IMPRESSION AND PLAN:  1) DM 2, recent dx.  Feet exam normal today. Much improved glucose control on diet, exercise, and metformin 1000 mg bid. I'll take lantus off med list, refill metformin, and have him continue nutritionist f/u and diet/exercise. Recheck A1c 3 mo.  Continue qd glucose checks (fasting), with a 2H PP a couple of days a week. Check urine microalb/cr next f/u.  Will recommend pneumovax next o/v.  2) Hypertriglyceridemia: likely related to metabolic derangement from uncontrolled DM. Expect this to improve with improved glucose control. Continue krill oil, increased fish in diet, recheck FLP next f/u 3 mo.  An After Visit Summary was printed and given to the patient.   FOLLOW UP: Return in about 3 months (around 09/26/2015) for routine chronic illness f/u (fasting).

## 2015-06-26 NOTE — Progress Notes (Signed)
Pre visit review using our clinic review tool, if applicable. No additional management support is needed unless otherwise documented below in the visit note. 

## 2015-07-02 ENCOUNTER — Encounter: Payer: 59 | Attending: Family Medicine

## 2015-07-02 DIAGNOSIS — Z794 Long term (current) use of insulin: Secondary | ICD-10-CM | POA: Diagnosis not present

## 2015-07-02 DIAGNOSIS — Z713 Dietary counseling and surveillance: Secondary | ICD-10-CM | POA: Diagnosis not present

## 2015-07-02 DIAGNOSIS — E119 Type 2 diabetes mellitus without complications: Secondary | ICD-10-CM | POA: Diagnosis present

## 2015-07-02 NOTE — Progress Notes (Signed)
Patient was seen on 07/02/15 for the third of a series of three diabetes self-management courses at the [facility].   [place]ina [place] the amount of activity recommended for healthy living . Describe activities suitable for individual needs . Identify ways to regularly incorporate activity into daily life . Identify barriers to activity and ways to over come these barriers  Identify diabetes medications being personally used and their primary action for lowering glucose and possible side effects . Describe role of stress on blood glucose and develop strategies to address psychosocial issues . Identify diabetes complications and ways to prevent them  Explain how to manage diabetes during illness . Evaluate success in meeting personal goal . Establish 2-3 goals that they will plan to diligently work on until they return for the  86-month follow-up visit  Goals:   I will use Glycemic Loading to manage diet  I will be active 120 minutes or more 4 times a week  I will take my diabetes medications as scheduled  I will test my glucose at least 2 times a day, 12 days a week  Your patient has identified these potential barriers to change:  None stated  Your patient has identified their diabetes self-care support plan as  None stated Plan:  Attend Optional Core 4 in 4 months

## 2015-07-22 ENCOUNTER — Other Ambulatory Visit: Payer: Self-pay | Admitting: *Deleted

## 2015-07-22 MED ORDER — METFORMIN HCL 1000 MG PO TABS: 1000.0000 mg | ORAL_TABLET | Freq: Two times a day (BID) | ORAL | Status: DC

## 2015-07-22 NOTE — Telephone Encounter (Signed)
RF request for metformin LOV: 06/26/15 Next ov: 09/26/15 Last written: 06/26/15 #90 w/ 1RF

## 2015-07-31 ENCOUNTER — Telehealth: Payer: Self-pay | Admitting: *Deleted

## 2015-07-31 ENCOUNTER — Other Ambulatory Visit: Payer: Self-pay | Admitting: *Deleted

## 2015-07-31 NOTE — Telephone Encounter (Signed)
Pls call his pharmacy and give authorization to fill his metformin early.  I don't know what his insurance will say, he may have to pay out of pocket since it is an early RF??-thx

## 2015-07-31 NOTE — Telephone Encounter (Signed)
Pt called and stated that he spilt a whole bottle of his metformin [place] the [place]. He stated that he only has two days left and the pharmacy will not refill because he is not due. Please advise. Thanks.

## 2015-08-01 NOTE — Telephone Encounter (Signed)
Pharmacy advised and stated that they will fill Rx but that pts insurance will not cover it since it is being filled early. Left detailed message on pts home vm, okay per DPR.

## 2015-09-01 DIAGNOSIS — I10 Essential (primary) hypertension: Secondary | ICD-10-CM

## 2015-09-01 HISTORY — DX: Essential (primary) hypertension: I10

## 2015-09-04 LAB — HM DIABETES EYE EXAM

## 2015-09-06 ENCOUNTER — Encounter: Payer: Self-pay | Admitting: Family Medicine

## 2015-09-06 ENCOUNTER — Ambulatory Visit (INDEPENDENT_AMBULATORY_CARE_PROVIDER_SITE_OTHER): Payer: 59 | Admitting: Family Medicine

## 2015-09-06 VITALS — BP 132/85 | HR 77 | Temp 98.4°F | Resp 16 | Ht 70.0 in | Wt 195.5 lb

## 2015-09-06 DIAGNOSIS — J018 Other acute sinusitis: Secondary | ICD-10-CM | POA: Diagnosis not present

## 2015-09-06 DIAGNOSIS — J069 Acute upper respiratory infection, unspecified: Secondary | ICD-10-CM | POA: Diagnosis not present

## 2015-09-06 MED ORDER — AZITHROMYCIN 250 MG PO TABS: ORAL_TABLET | ORAL | Status: DC

## 2015-09-06 NOTE — Progress Notes (Signed)
OFFICE VISIT  09/06/2015   CC:  Chief Complaint  Patient presents with  . URI    x 1 week   HPI:    Patient is a 55 y.o. Caucasian male who presents for respiratory complaints. Onset >1 week ago of nasal congestion, PND, coughing, forehead HA last night, some peri-orbital HA, little ST. No fever.  Some wheezing but very short lived and hasn't required rescue inhaler: coughs and it clears up. No n/v/d.  No body aches. No SOB.  Past Medical History  Diagnosis Date  . Depression     Prozac in the past not much help.  Spontaneously resolved.  . Adenomatous colon polyp 05/07/06; 05/2012    Repeat colonoscopy 05/17/2012 showed 2 small polyps that were removed--path showed Hyperplastic-not adenomatous.  Still needs repeat 5 yrs.  . Diverticulosis   . Hypogonadism male     Felt improved on andogel but testost levels did not improve.  . Rotator cuff tear 2011    [facility]: conservative mgmt--symptoms stable as of 04/2011.  . 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  . Insomnia   . Hypertriglyceridemia     fish oil OTC  . Angioedema of lips     saw allergist 11/2011  . Hemorrhoids     ext and int  . Generalized anxiety disorder 08/11/2012  . Chronic recurrent sinusitis 02/28/2012  . Asthmatic bronchitis , chronic ([name]) 01/11/2012    Question occupational asthma with no specific agents identified. He does notice that some areas of his job are particularly dusty and dirty. Currently he is well controlled using Symbicort through an AeroChamber and rarely needing his rescue inhaler. This would be an acceptable long-term status. Elevated nonspecific total IgE and peripheral eosinophilia to suggest an atopic trigger with sensitization to something Consider Daliresp for future use if needed   . Hypogonadism, male 04/08/2011    Several time has been <100 (saw [facility] in 2011 for this, w/u for secondary hypogonadism  was unremarkable), most recent testosterone check via [facility] 10/2010 was 1.15 ng/ml.   . Internal hemorrhoids, bleeding and sicharge 05/29/2013  . Diabetes mellitus without complication [facility])     Past Surgical History  Procedure Laterality Date  . Adnoids    . Wisdom tooth extraction    . Colonoscopy  05/2006, 05/2012  . Polypectomy      Adenomatous 2007; hyperplastic 2013.  Repeat 5 yrs.  . Hemorrhoid banding      Outpatient Prescriptions Prior to Visit  Medication Sig Dispense Refill  . EPINEPHrine (EPIPEN) 0.3 mg/0.3 mL DEVI Inject 0.3 mLs (0.3 mg total) into the muscle once. 1 Device 3  . fexofenadine-pseudoephedrine (ALLEGRA-D 24 HOUR) 180-240 MG per 24 hr tablet Take 1 tablet by mouth daily. 90 tablet 1  . glucose blood test strip Check glucose bid 100 each 12  . metFORMIN (GLUCOPHAGE) 1000 MG tablet Take 1 tablet (1,000 mg total) by mouth 2 (two) times daily with a meal. 180 tablet 1  . tadalafil (CIALIS) 20 MG tablet Take 1 tablet (20 mg total) by mouth as needed. 10 tablet 12  . Testosterone (FORTESTA) 10 MG/ACT (2%) GEL Place 40 mg onto the skin daily. (2 pumps to each thigh) 60 g 5  . Insulin Pen Needle (PEN NEEDLES) 31G X 6 MM MISC 1 Device by Does not apply route daily. (Patient not taking: Reported on 09/06/2015) 50 each 11  No facility-administered medications prior to visit.    Allergies  Allergen Reactions  . Molds & Smuts Other (See Comments)    ROS As per HPI  PE: Blood pressure 132/85, pulse 77, temperature 98.4 F (36.9 C), temperature source Oral, resp. rate 16, height 5\' 10"  (1.778 m), weight 195 lb 8 oz (88.678 kg), SpO2 99 %. VS: noted--normal. Gen: alert, NAD, NONTOXIC APPEARING. HEENT: eyes without injection, drainage, or swelling.  Ears: EACs clear, TMs with normal light reflex and landmarks.  Nose: Clear rhinorrhea, with some dried, crusty exudate adherent to mildly injected mucosa.  No purulent d/c.  No paranasal sinus TTP.  No facial swelling.   Throat and mouth without focal lesion.  No pharyngial swelling, erythema, or exudate.   Neck: supple, no LAD.   LUNGS: CTA bilat, nonlabored resps.   CV: RRR, no m/r/g. EXT: no c/c/e SKIN: no rash    LABS:  none  IMPRESSION AND PLAN:  Prolonged URI, not improving, possible acute sinusitis.   Z pack rx printed and handed to pt, asked him to hold rx and wait for sx's to improve over the next 2-3d and if they don't then fill rx/take it. Continue current symptomatic care. Signs/symptoms to call or return for were reviewed and pt expressed understanding.  An After Visit Summary was printed and given to the patient.   FOLLOW UP: Return if symptoms worsen or fail to improve.

## 2015-09-06 NOTE — Progress Notes (Signed)
Pre visit review using our clinic review tool, if applicable. No additional management support is needed unless otherwise documented below in the visit note. 

## 2015-09-24 ENCOUNTER — Encounter: Payer: Self-pay | Admitting: Family Medicine

## 2015-09-24 ENCOUNTER — Ambulatory Visit (INDEPENDENT_AMBULATORY_CARE_PROVIDER_SITE_OTHER): Payer: 59 | Admitting: Family Medicine

## 2015-09-24 VITALS — BP 134/90 | HR 74 | Temp 98.1°F | Resp 16 | Ht 70.0 in | Wt 200.0 lb

## 2015-09-24 DIAGNOSIS — E119 Type 2 diabetes mellitus without complications: Secondary | ICD-10-CM | POA: Diagnosis not present

## 2015-09-24 DIAGNOSIS — Z114 Encounter for screening for human immunodeficiency virus [HIV]: Secondary | ICD-10-CM

## 2015-09-24 DIAGNOSIS — Z23 Encounter for immunization: Secondary | ICD-10-CM | POA: Diagnosis not present

## 2015-09-24 MED ORDER — ALBUTEROL SULFATE HFA 108 (90 BASE) MCG/ACT IN AERS: 1.0000 | INHALATION_SPRAY | RESPIRATORY_TRACT | Status: DC | PRN

## 2015-09-24 NOTE — Addendum Note (Signed)
Addended by: Onalee Hua on: 09/24/2015 09:23 AM   Modules accepted: Orders

## 2015-09-24 NOTE — Progress Notes (Signed)
OFFICE VISIT  09/24/2015   CC:  Chief Complaint  Patient presents with  . Follow-up    Pt is not fasting.    HPI:    Patient is a 55 y.o. Caucasian male who presents for 3 mo f/u DM 2--recent dx about 3-4 mo ago. Avg fasting gluc 115.  Avg 2H PP is <120. Taking metformin 1000 mg bid and eating diabetic diet. Not exercising YET. No acute complaints.   Past Medical History  Diagnosis Date  . Depression     Prozac in the past not much help.  Spontaneously resolved.  . Adenomatous colon polyp 05/07/06; 05/2012    Repeat colonoscopy 05/17/2012 showed 2 small polyps that were removed--path showed Hyperplastic-not adenomatous.  Still needs repeat 5 yrs.  . Diverticulosis   . Hypogonadism male     Felt improved on andogel but testost levels did not improve.  . Rotator cuff tear 2011    [facility]: conservative mgmt--symptoms stable as of 04/2011.  . 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  . Insomnia   . Hypertriglyceridemia     fish oil OTC  . Angioedema of lips     saw allergist 11/2011  . Hemorrhoids     ext and int  . Generalized anxiety disorder 08/11/2012  . Chronic recurrent sinusitis 02/28/2012  . Asthmatic bronchitis , chronic ([place]) 01/11/2012    Question occupational asthma with no specific agents identified. He does notice that some areas of his job are particularly dusty and dirty. Currently he is well controlled using Symbicort through an AeroChamber and rarely needing his rescue inhaler. This would be an acceptable long-term status. Elevated nonspecific total IgE and peripheral eosinophilia to suggest an atopic trigger with sensitization to something Consider Daliresp for future use if needed   . Hypogonadism, male 04/08/2011    Several time has been <100 (saw [facility] in 2011 for this, w/u for secondary hypogonadism was unremarkable), most recent testosterone check via [facility] 10/2010 was  1.15 ng/ml.   . Internal hemorrhoids, bleeding and sicharge 05/29/2013  . Diabetes mellitus without complication [facility])     Past Surgical History  Procedure Laterality Date  . Adnoids    . Wisdom tooth extraction    . Colonoscopy  05/2006, 05/2012  . Polypectomy      Adenomatous 2007; hyperplastic 2013.  Repeat 5 yrs.  . Hemorrhoid banding      Outpatient Prescriptions Prior to Visit  Medication Sig Dispense Refill  . EPINEPHrine (EPIPEN) 0.3 mg/0.3 mL DEVI Inject 0.3 mLs (0.3 mg total) into the muscle once. 1 Device 3  . fexofenadine-pseudoephedrine (ALLEGRA-D 24 HOUR) 180-240 MG per 24 hr tablet Take 1 tablet by mouth daily. 90 tablet 1  . glucose blood test strip Check glucose bid 100 each 12  . metFORMIN (GLUCOPHAGE) 1000 MG tablet Take 1 tablet (1,000 mg total) by mouth 2 (two) times daily with a meal. 180 tablet 1  . tadalafil (CIALIS) 20 MG tablet Take 1 tablet (20 mg total) by mouth as needed. 10 tablet 12  . Testosterone (FORTESTA) 10 MG/ACT (2%) GEL Place 40 mg onto the skin daily. (2 pumps to each thigh) 60 g 5  . azithromycin (ZITHROMAX) 250 MG tablet 2 tabs po qd x 1d, then 1 tab po qd x 4d (Patient not taking: Reported on 09/24/2015) 6 each 0   No facility-administered medications prior to visit.  Allergies  Allergen Reactions  . Molds & Smuts Other (See Comments)    ROS As per HPI  PE: Blood pressure 134/90, pulse 74, temperature 98.1 F (36.7 C), temperature source Oral, resp. rate 16, height 5\' 10"  (1.778 m), weight 200 lb (90.719 kg), SpO2 96 %. Gen: Alert, well appearing.  Patient is oriented to person, place, time, and situation. AFFECT: pleasant, lucid thought and speech. No further exam today.  LABS:  Lab Results  Component Value Date   TESTOSTERONE 228.54* 06/03/2015   Lab Results  Component Value Date   WBC 8.0 06/03/2015   HGB 15.4 06/03/2015   HCT 45.6 06/03/2015   MCV 82.0 06/03/2015   PLT 226.0 06/03/2015   Lab Results  Component Value  Date   HGBA1C 12.1* 06/03/2015     Chemistry      Component Value Date/Time   NA 137 06/03/2015 1140   K 4.1 06/03/2015 1140   CL 99 06/03/2015 1140   CO2 28 06/03/2015 1140   BUN 13 06/03/2015 1140   CREATININE 0.66 06/03/2015 1140      Component Value Date/Time   CALCIUM 9.6 06/03/2015 1140   ALKPHOS 87 06/03/2015 1140   AST 9 06/03/2015 1140   ALT 14 06/03/2015 1140   BILITOT 0.4 06/03/2015 1140      IMPRESSION AND PLAN:  1) DM 2, recent dx. Doing great with diet/metformin. Urine microalb/cr today, HbA1c today. Pneumovax 23 today.  2) Screening for HIV: pt agreeable to this today.  An After Visit Summary was printed and given to the patient.  FOLLOW UP: Return in about 3 months (around 12/23/2015) for routine chronic illness f/u.

## 2015-09-24 NOTE — Addendum Note (Signed)
Addended by: Ralph Dowdy on: 09/24/2015 09:02 AM   Modules accepted: Orders

## 2015-09-24 NOTE — Progress Notes (Signed)
Pre visit review using our clinic review tool, if applicable. No additional management support is needed unless otherwise documented below in the visit note. 

## 2015-09-25 ENCOUNTER — Other Ambulatory Visit (INDEPENDENT_AMBULATORY_CARE_PROVIDER_SITE_OTHER): Payer: 59

## 2015-09-25 DIAGNOSIS — Z114 Encounter for screening for human immunodeficiency virus [HIV]: Secondary | ICD-10-CM

## 2015-09-25 DIAGNOSIS — E119 Type 2 diabetes mellitus without complications: Secondary | ICD-10-CM

## 2015-09-25 LAB — LIPID PANEL
CHOLESTEROL: 155 mg/dL (ref 0–200)
HDL: 34.8 mg/dL — AB (ref >39.00)
NonHDL: 119.9
TRIGLYCERIDES: 246 mg/dL — AB (ref 0.0–149.0)
Total CHOL/HDL Ratio: 4
VLDL: 49.2 mg/dL — AB (ref 0.0–40.0)

## 2015-09-25 LAB — HEMOGLOBIN A1C: Hgb A1c MFr Bld: 6.4 % (ref 4.6–6.5)

## 2015-09-25 LAB — MICROALBUMIN / CREATININE URINE RATIO
Creatinine,U: 98.8 mg/dL
Microalb Creat Ratio: 0.7 mg/g (ref 0.0–30.0)
Microalb, Ur: <0.7 mg/dL (ref 0.0–1.9)

## 2015-09-25 LAB — LDL CHOLESTEROL, DIRECT: LDL DIRECT: 83 mg/dL

## 2015-09-26 ENCOUNTER — Ambulatory Visit: Payer: 59 | Admitting: Family Medicine

## 2015-09-26 LAB — HIV ANTIBODY (ROUTINE TESTING W REFLEX): HIV: NONREACTIVE

## 2015-11-28 ENCOUNTER — Encounter (HOSPITAL_COMMUNITY): Payer: Self-pay

## 2015-11-28 ENCOUNTER — Emergency Department (HOSPITAL_COMMUNITY)
Admission: EM | Admit: 2015-11-28 | Discharge: 2015-11-28 | Disposition: A | Discharge status: Home / Self Care | Payer: 59 | Attending: Emergency Medicine | Admitting: Emergency Medicine

## 2015-11-28 DIAGNOSIS — Z8719 Personal history of other diseases of the digestive system: Secondary | ICD-10-CM | POA: Diagnosis not present

## 2015-11-28 DIAGNOSIS — R55 Syncope and collapse: Secondary | ICD-10-CM | POA: Insufficient documentation

## 2015-11-28 DIAGNOSIS — Z8659 Personal history of other mental and behavioral disorders: Secondary | ICD-10-CM | POA: Insufficient documentation

## 2015-11-28 DIAGNOSIS — R42 Dizziness and giddiness: Secondary | ICD-10-CM | POA: Insufficient documentation

## 2015-11-28 DIAGNOSIS — J45909 Unspecified asthma, uncomplicated: Secondary | ICD-10-CM | POA: Diagnosis not present

## 2015-11-28 DIAGNOSIS — I959 Hypotension, unspecified: Secondary | ICD-10-CM | POA: Insufficient documentation

## 2015-11-28 DIAGNOSIS — Z86010 Personal history of colonic polyps: Secondary | ICD-10-CM | POA: Insufficient documentation

## 2015-11-28 DIAGNOSIS — Z7984 Long term (current) use of oral hypoglycemic drugs: Secondary | ICD-10-CM | POA: Insufficient documentation

## 2015-11-28 DIAGNOSIS — Z79899 Other long term (current) drug therapy: Secondary | ICD-10-CM | POA: Insufficient documentation

## 2015-11-28 DIAGNOSIS — E119 Type 2 diabetes mellitus without complications: Secondary | ICD-10-CM | POA: Insufficient documentation

## 2015-11-28 LAB — CBC WITH DIFFERENTIAL/PLATELET
BASOS ABS: 0 10*3/uL (ref 0.0–0.1)
BASOS PCT: 1 %
EOS ABS: 0.3 10*3/uL (ref 0.0–0.7)
Eosinophils Relative: 5 %
HCT: 42 % (ref 39.0–52.0)
Hemoglobin: 14.1 g/dL (ref 13.0–17.0)
Lymphocytes Relative: 19 %
Lymphs Abs: 1.3 10*3/uL (ref 0.7–4.0)
MCH: 27.5 pg (ref 26.0–34.0)
MCHC: 33.6 g/dL (ref 30.0–36.0)
MCV: 82 fL (ref 78.0–100.0)
MONO ABS: 0.4 10*3/uL (ref 0.1–1.0)
MONOS PCT: 6 %
Neutro Abs: 4.5 10*3/uL (ref 1.7–7.7)
Neutrophils Relative %: 69 %
PLATELETS: 213 10*3/uL (ref 150–400)
RBC: 5.12 MIL/uL (ref 4.22–5.81)
RDW: 13.6 % (ref 11.5–15.5)
WBC: 6.5 10*3/uL (ref 4.0–10.5)

## 2015-11-28 LAB — BASIC METABOLIC PANEL
Anion gap: 10 (ref 5–15)
BUN: 16 mg/dL (ref 6–20)
CO2: 22 mmol/L (ref 22–32)
CREATININE: 1.01 mg/dL (ref 0.61–1.24)
Calcium: 9.2 mg/dL (ref 8.9–10.3)
Chloride: 107 mmol/L (ref 101–111)
Glucose, Bld: 138 mg/dL — ABNORMAL HIGH (ref 65–99)
Potassium: 5.2 mmol/L — ABNORMAL HIGH (ref 3.5–5.1)
SODIUM: 139 mmol/L (ref 135–145)

## 2015-11-28 LAB — I-STAT TROPONIN, ED: TROPONIN I, POC: 0 ng/mL (ref 0.00–0.08)

## 2015-11-28 MED ORDER — SODIUM CHLORIDE 0.9 % IV BOLUS (SEPSIS)
1000.0000 mL | Freq: Once | INTRAVENOUS | Status: AC
  Administered 2015-11-28: 1000 mL via INTRAVENOUS

## 2015-11-28 NOTE — ED Provider Notes (Signed)
CSN: [id number]     Arrival date & time 11/28/15  1019 History   First MD Initiated Contact with Patient 11/28/15 1027     Chief Complaint  Patient presents with  . Hypotension     (Consider location/radiation/quality/duration/timing/severity/associated sxs/prior Treatment) HPI Comments: Patient presents today with a chief complaint of near syncope and hypotension.  He states that he was a the [employer] earlier today working out when he became lightheaded, diaphoretic, and began having tunnel vision.  He states that he then laid down and drank some water and symptoms improved.  He then got up and walked approximately 25 feet and symptoms returned.  EMS was then called and according to EMS his BP was 70/40 upon arrival.  He was then given 250 cc IVF.  His blood pressure was 112/74 upon arrival in the ED.  He reports that his symptoms have improved at this time.  He denies any LOC, CP, SOB, palpitations, abdominal pain, numbness, tingling, nausea, vomiting, focal weakness, or any other symptoms associated with this episode.  He does have a history of DM, but states that he is not on any medication for this.  He controls it with diet.  CBG was 141 when EMS arrived.  He reports that he did not eat or drink anything this morning prior to working out.  He is not on any antihypertensive medications.  He reports that he was in his normal state of health prior to this episode.  He denies any prior cardiac history.  No history of PE or DVT.  NO prolonged travel or surgeries in the past 4 weeks.  The history is provided by the patient.    Past Medical History  Diagnosis Date  . Depression     Prozac in the past not much help.  Spontaneously resolved.  . Adenomatous colon polyp 05/07/06; 05/2012    Repeat colonoscopy 05/17/2012 showed 2 small polyps that were removed--path showed Hyperplastic-not adenomatous.  Still needs repeat 5 yrs.  . Diverticulosis   . Hypogonadism male     Felt improved on andogel but testost  levels did not improve.  . Rotator cuff tear 2011    [facility]: conservative mgmt--symptoms stable as of 04/2011.  . 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  . Insomnia   . Hypertriglyceridemia     fish oil OTC  . Angioedema of lips     saw allergist 11/2011  . Hemorrhoids     ext and int  . Generalized anxiety disorder 08/11/2012  . Chronic recurrent sinusitis 02/28/2012  . Asthmatic bronchitis , chronic ([place]) 01/11/2012    Question occupational asthma with no specific agents identified. He does notice that some areas of his job are particularly dusty and dirty. Currently he is well controlled using Symbicort through an AeroChamber and rarely needing his rescue inhaler. This would be an acceptable long-term status. Elevated nonspecific total IgE and peripheral eosinophilia to suggest an atopic trigger with sensitization to something Consider Daliresp for future use if needed   . Hypogonadism, male 04/08/2011    Several time has been <100 (saw [facility] in 2011 for this, w/u for secondary hypogonadism was unremarkable), most recent testosterone check via [facility] 10/2010 was 1.15 ng/ml.   . Internal hemorrhoids, bleeding and sicharge 05/29/2013  . Diabetes mellitus without complication [facility])    Past Surgical History  Procedure Laterality Date  . Adnoids    .  Wisdom tooth extraction    . Colonoscopy  05/2006, 05/2012  . Polypectomy      Adenomatous 2007; hyperplastic 2013.  Repeat 5 yrs.  . Hemorrhoid banding     Family History  Problem Relation Age of Onset  . Heart disease Mother   . Hypertension Mother   . Diabetes Mother   . Heart disease Father   . Hypertension Father   . Diabetes Father    Social History  Substance Use Topics  . Smoking status: Never Smoker   . Smokeless tobacco: Never Used  . Alcohol Use: Yes     Comment: rarely    Review of Systems  All other systems reviewed and are  negative.     Allergies  Molds & smuts  Home Medications   Prior to Admission medications   Medication Sig Start Date End Date Taking? Authorizing Provider  albuterol (VENTOLIN HFA) 108 (90 Base) MCG/ACT inhaler Inhale 1-2 puffs into the lungs every 4 (four) hours as needed for wheezing. 09/24/15 09/23/16 Yes Tammi Sou, MD  fexofenadine-pseudoephedrine (ALLEGRA-D 24 HOUR) 180-240 MG per 24 hr tablet Take 1 tablet by mouth daily. 09/28/12  Yes Tammi Sou, MD  metFORMIN (GLUCOPHAGE) 1000 MG tablet Take 1 tablet (1,000 mg total) by mouth 2 (two) times daily with a meal. 07/22/15  Yes Tammi Sou, MD  Testosterone (FORTESTA) 10 MG/ACT (2%) GEL Place 40 mg onto the skin daily. (2 pumps to each thigh) 06/03/15  Yes Tammi Sou, MD  TURMERIC PO Take 1 capsule by mouth every other day.   Yes Historical Provider, MD  EPINEPHrine (EPIPEN) 0.3 mg/0.3 mL DEVI Inject 0.3 mLs (0.3 mg total) into the muscle once. 12/07/11   Tammi Sou, MD  glucose blood test strip Check glucose bid 06/10/15   Tammi Sou, MD   BP 112/77 mmHg  Pulse 71  Resp 15  Ht 5\' 10"  (1.778 m)  Wt 88.451 kg  BMI 27.98 kg/m2  SpO2 97% Physical Exam  Constitutional: He appears well-developed and well-nourished.  HENT:  Head: Normocephalic and atraumatic.  Neck: Normal range of motion. Neck supple.  Cardiovascular: Normal rate, regular rhythm and normal heart sounds.   Pulses:      Dorsalis pedis pulses are 2+ on the right side, and 2+ on the left side.  Pulmonary/Chest: Effort normal and breath sounds normal.  Abdominal: Soft. Bowel sounds are normal. He exhibits no distension and no mass. There is no tenderness. There is no rebound and no guarding.  Musculoskeletal: Normal range of motion.  No LE edema bilaterally  Neurological: He is alert. He has normal strength. No cranial nerve deficit or sensory deficit. Coordination and gait normal.  Skin: Skin is warm and dry.  Psychiatric: He has a  normal mood and affect.  Nursing note and vitals reviewed.   ED Course  Procedures (including critical care time) Labs Review Labs Reviewed  CBC WITH DIFFERENTIAL/PLATELET  BASIC METABOLIC PANEL  I-STAT [place], ED    Imaging Review No results found. I have personally reviewed and evaluated these images and lab results as part of my medical decision-making.   EKG Interpretation None     12:14 PM Patient reports that his symptoms have improved at this time.  Patient ambulated without difficulty. MDM   Final diagnoses:  None   Patient presents today with a chief complaint of near syncope and hypotension, which occurred while working out this morning.  Symptoms improved after given IVF.  Symptoms  appear to be orthostatic.  He reports that he did not eat or drink anything this morning prior to working out.  He has a normal neurological exam.  He denies any chest pain or SOB.  VSS in the ED.  EKG unremarkable.  Therefore, doubt cardiac etiology, PE, or aortic dissection.  He is afebrile.  Labs unremarkable.  Patient ambulating in the ED after IVF without any return in symptoms.  Patient stable for discharge.  Return precautions given.    Hyman Bible, PA-C 11/28/15 2112  Blanchie Dessert, MD 11/29/15 1459

## 2015-11-28 NOTE — ED Notes (Signed)
Pt. Coming from [employer] [facility] via [facility] after an episode of lethargy, confusion, and lightheadedness. Pt. Working out with a trainer an hour earlier than normal and did not eat or drink beforehand. Pt. Trainer stated that pt. Became diaphoretic and confused and he helped the patient to the ground. Pt. Hx of DM, so staff at the gym gave him a snack while waiting on EMS. Pt. Found to have BP of 70/40 on scene. EMS started 269mL fluid and brought systolic BP to [id number] prior to arrival. EMS also reports pts. Radial pulse weak. Pt. Denies cardiac hx. Pt. Denies falling or LOC. Pt. Aox4.

## 2015-11-28 NOTE — ED Notes (Signed)
Pt is in stable condition upon d/c and ambulates from ED. 

## 2015-11-29 ENCOUNTER — Encounter: Payer: Self-pay | Admitting: Family Medicine

## 2015-11-29 ENCOUNTER — Ambulatory Visit (INDEPENDENT_AMBULATORY_CARE_PROVIDER_SITE_OTHER): Payer: 59 | Admitting: Family Medicine

## 2015-11-29 VITALS — BP 154/83 | HR 68 | Temp 98.0°F | Resp 16 | Ht 70.0 in | Wt 207.5 lb

## 2015-11-29 DIAGNOSIS — R55 Syncope and collapse: Secondary | ICD-10-CM

## 2015-11-29 NOTE — Progress Notes (Signed)
OFFICE VISIT  11/29/2015   CC:  Chief Complaint  Patient presents with  . Follow-up    ER visit for nea syncope and hypotension   HPI:    Patient is a 55 y.o. Caucasian male who presents for f/u of an ER visit on 11/28/15 (yesterday morning).  Reviewed ED records today. He had an episode of near syncope and hypotension while working out yesterday.   He had been doing some moderate wt lifting and was then doing push ups and began to feel unusual weakness/fatigue.  He then started to do a shoulder exercise and he began to feel dizzy with malaise.  Started to sweat a lot and trainer gave him a protein bar and water.  This helped. EMS was called.  He nearly passed out with cold sweats when EMS evaluated him, plus his bp was 70/40.  Eval in ED after EMS gave him 250 cc of fluid showed no significant abnormalities.  He had worked out right after getting off from work and had not eaten or drunk anything. This has happened to him about 10 total times in the past.  He says that not eating, not drinking enough, or not getting enough sleep have all been implicated as the cause in past times this has happened.  ROS: no itching/hives, no swelling anywhere, no wheezing or SOB, no GI upset.  Past Medical History  Diagnosis Date  . Depression     Prozac in the past not much help.  Spontaneously resolved.  . Adenomatous colon polyp 05/07/06; 05/2012    Repeat colonoscopy 05/17/2012 showed 2 small polyps that were removed--path showed Hyperplastic-not adenomatous.  Still needs repeat 5 yrs.  . Diverticulosis   . Hypogonadism male     Felt improved on andogel but testost levels did not improve.  . Rotator cuff tear 2011    [facility]: conservative mgmt--symptoms stable as of 04/2011.  . 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  . Insomnia   . Hypertriglyceridemia     fish oil OTC  . Angioedema of lips     saw allergist 11/2011   . Hemorrhoids     ext and int  . Generalized anxiety disorder 08/11/2012  . Chronic recurrent sinusitis 02/28/2012  . Asthmatic bronchitis , chronic ([place]) 01/11/2012    Question occupational asthma with no specific agents identified. He does notice that some areas of his job are particularly dusty and dirty. Currently he is well controlled using Symbicort through an AeroChamber and rarely needing his rescue inhaler. This would be an acceptable long-term status. Elevated nonspecific total IgE and peripheral eosinophilia to suggest an atopic trigger with sensitization to something Consider Daliresp for future use if needed   . Hypogonadism, male 04/08/2011    Several time has been <100 (saw [facility] in 2011 for this, w/u for secondary hypogonadism was unremarkable), most recent testosterone check via [facility] 10/2010 was 1.15 ng/ml.   . Internal hemorrhoids, bleeding and sicharge 05/29/2013  . Diabetes mellitus without complication [facility])     Past Surgical History  Procedure Laterality Date  . Adnoids    . Wisdom tooth extraction    . Colonoscopy  05/2006, 05/2012  . Polypectomy      Adenomatous 2007; hyperplastic 2013.  Repeat 5 yrs.  . Hemorrhoid banding      Outpatient Prescriptions Prior to Visit  Medication Sig Dispense Refill  . albuterol (VENTOLIN  HFA) 108 (90 Base) MCG/ACT inhaler Inhale 1-2 puffs into the lungs every 4 (four) hours as needed for wheezing. 1 Inhaler 1  . EPINEPHrine (EPIPEN) 0.3 mg/0.3 mL DEVI Inject 0.3 mLs (0.3 mg total) into the muscle once. 1 Device 3  . fexofenadine-pseudoephedrine (ALLEGRA-D 24 HOUR) 180-240 MG per 24 hr tablet Take 1 tablet by mouth daily. 90 tablet 1  . glucose blood test strip Check glucose bid 100 each 12  . metFORMIN (GLUCOPHAGE) 1000 MG tablet Take 1 tablet (1,000 mg total) by mouth 2 (two) times daily with a meal. 180 tablet 1  . Testosterone (FORTESTA) 10 MG/ACT (2%) GEL Place 40 mg onto the skin daily. (2 pumps to each thigh) 60 g 5   . TURMERIC PO Take 1 capsule by mouth every other day.     No facility-administered medications prior to visit.    Allergies  Allergen Reactions  . Molds & Smuts Other (See Comments)    ROS As per HPI  PE: Blood pressure 154/83, pulse 68, temperature 98 F (36.7 C), temperature source Oral, resp. rate 16, height 5\' 10"  (1.778 m), weight 207 lb 8 oz (94.121 kg), SpO2 94 %. Gen: Alert, well appearing.  Patient is oriented to person, place, time, and situation. ENT: Ears: EACs clear, normal epithelium.  TMs with good light reflex and landmarks bilaterally.  Eyes: no injection, icteris, swelling, or exudate.  EOMI, PERRLA. Nose: no drainage or turbinate edema/swelling.  No injection or focal lesion.  Mouth: lips without lesion/swelling.  Oral mucosa pink and moist.  Dentition intact and without obvious caries or gingival swelling.  Oropharynx without erythema, exudate, or swelling.  Neck - No masses or thyromegaly or limitation in range of motion CV: RRR, no m/r/g.   LUNGS: CTA bilat, nonlabored resps, good aeration in all lung fields. EXT: no clubbing, cyanosis, or edema.   LABS:    Chemistry      Component Value Date/Time   NA 139 11/28/2015 1043   K 5.2* 11/28/2015 1043   CL 107 11/28/2015 1043   CO2 22 11/28/2015 1043   BUN 16 11/28/2015 1043   CREATININE 1.01 11/28/2015 1043      Component Value Date/Time   CALCIUM 9.2 11/28/2015 1043   ALKPHOS 87 06/03/2015 1140   AST 9 06/03/2015 1140   ALT 14 06/03/2015 1140   BILITOT 0.4 06/03/2015 1140     Lab Results  Component Value Date   WBC 6.5 11/28/2015   HGB 14.1 11/28/2015   HCT 42.0 11/28/2015   MCV 82.0 11/28/2015   PLT 213 11/28/2015   IMPRESSION AND PLAN:  Recurrent exercise induced presyncope. This is the only time he has had low bp documented per his report. Due to the very intermittent nature of this problem I don't think it is due to a cardiac problem. I wonder about the possibility of exercise-induced  anaphylaxis, so I have asked him to make an appt with his allergist in order to get their opinion on this. Otherwise, I encouraged him to make sure he does not work out on empty stomach and make sure he is well hydrated before and during exercise.  An After Visit Summary was printed and given to the patient.  FOLLOW UP: Return if symptoms worsen or fail to improve.  Signed:  Crissie Sickles, MD           11/29/2015

## 2015-11-29 NOTE — Progress Notes (Signed)
Pre visit review using our clinic review tool, if applicable. No additional management support is needed unless otherwise documented below in the visit note. 

## 2015-11-29 NOTE — Patient Instructions (Signed)
Go to an allergist to be evaluated for your history of exercise induced pre-syncope/syncope.

## 2015-12-26 ENCOUNTER — Ambulatory Visit (INDEPENDENT_AMBULATORY_CARE_PROVIDER_SITE_OTHER): Payer: 59 | Admitting: Family Medicine

## 2015-12-26 ENCOUNTER — Encounter: Payer: Self-pay | Admitting: Family Medicine

## 2015-12-26 VITALS — BP 160/106 | HR 65 | Temp 98.1°F | Resp 16 | Ht 70.0 in | Wt 207.0 lb

## 2015-12-26 DIAGNOSIS — E119 Type 2 diabetes mellitus without complications: Secondary | ICD-10-CM | POA: Diagnosis not present

## 2015-12-26 DIAGNOSIS — R030 Elevated blood-pressure reading, without diagnosis of hypertension: Secondary | ICD-10-CM

## 2015-12-26 LAB — BASIC METABOLIC PANEL
BUN: 14 mg/dL (ref 6–23)
CHLORIDE: 103 meq/L (ref 96–112)
CO2: 28 mEq/L (ref 19–32)
Calcium: 9.8 mg/dL (ref 8.4–10.5)
Creatinine, Ser: 0.77 mg/dL (ref 0.40–1.50)
GFR: 110.79 mL/min (ref >60.00)
Glucose, Bld: 128 mg/dL — ABNORMAL HIGH (ref 70–99)
POTASSIUM: 4.4 meq/L (ref 3.5–5.1)
SODIUM: 141 meq/L (ref 135–145)

## 2015-12-26 LAB — HEMOGLOBIN A1C: HEMOGLOBIN A1C: 6.5 % (ref 4.6–6.5)

## 2015-12-26 NOTE — Progress Notes (Signed)
OFFICE VISIT  12/26/2015   CC:  Chief Complaint  Patient presents with  . Follow-up    Pt is not fasting.    HPI:    Patient is a 55 y.o. Caucasian male who presents for 3 mo f/u DM 2. Gets BP monitored at work and it has always been normal per pt report.  He is seeking out a new allergy group due to not getting along with the allergist at his current group ([facility]). He has not worked out since his presyncopal episode in March this year.  Glucoses: was taking a supplement called "Smart glucose" and glucoses went up some.  Also stopped taking vinegar at night and he thinks this has resulted in higher glucoses. Fastings 130-140.  Sometimes later in the day it is around 100. He is experimenting with his diet some lately.  Past Medical History  Diagnosis Date  . Depression     Prozac in the past not much help.  Spontaneously resolved.  . Adenomatous colon polyp 05/07/06; 05/2012    Repeat colonoscopy 05/17/2012 showed 2 small polyps that were removed--path showed Hyperplastic-not adenomatous.  Still needs repeat 5 yrs.  . Diverticulosis   . Hypogonadism male     Felt improved on andogel but testost levels did not improve.  . Rotator cuff tear 2011    [facility]: conservative mgmt--symptoms stable as of 04/2011.  . 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  . Insomnia   . Hypertriglyceridemia     fish oil OTC  . Angioedema of lips     saw allergist 11/2011  . Hemorrhoids     ext and int  . Generalized anxiety disorder 08/11/2012  . Chronic recurrent sinusitis 02/28/2012  . Asthmatic bronchitis , chronic ([place]) 01/11/2012    Question occupational asthma with no specific agents identified. He does notice that some areas of his job are particularly dusty and dirty. Currently he is well controlled using Symbicort through an AeroChamber and rarely needing his rescue inhaler. This would be an  acceptable long-term status. Elevated nonspecific total IgE and peripheral eosinophilia to suggest an atopic trigger with sensitization to something Consider Daliresp for future use if needed   . Hypogonadism, male 04/08/2011    Several time has been <100 (saw [facility] in 2011 for this, w/u for secondary hypogonadism was unremarkable), most recent testosterone check via [facility] 10/2010 was 1.15 ng/ml.   . Internal hemorrhoids, bleeding and sicharge 05/29/2013  . Diabetes mellitus without complication [facility])     Past Surgical History  Procedure Laterality Date  . Adnoids    . Wisdom tooth extraction    . Colonoscopy  05/2006, 05/2012  . Polypectomy      Adenomatous 2007; hyperplastic 2013.  Repeat 5 yrs.  . Hemorrhoid banding      Outpatient Prescriptions Prior to Visit  Medication Sig Dispense Refill  . albuterol (VENTOLIN HFA) 108 (90 Base) MCG/ACT inhaler Inhale 1-2 puffs into the lungs every 4 (four) hours as needed for wheezing. 1 Inhaler 1  . CIALIS 20 MG tablet Take 1 tablet by mouth daily as needed.  12  . EPINEPHrine (EPIPEN) 0.3 mg/0.3 mL DEVI Inject 0.3 mLs (0.3 mg total) into the muscle once. 1 Device 3  . fexofenadine-pseudoephedrine (ALLEGRA-D 24 HOUR) 180-240 MG per 24 hr tablet Take 1 tablet by mouth daily. 90 tablet 1  . glucose blood test  strip Check glucose bid 100 each 12  . metFORMIN (GLUCOPHAGE) 1000 MG tablet Take 1 tablet (1,000 mg total) by mouth 2 (two) times daily with a meal. 180 tablet 1  . Testosterone (FORTESTA) 10 MG/ACT (2%) GEL Place 40 mg onto the skin daily. (2 pumps to each thigh) 60 g 5  . TURMERIC PO Take 1 capsule by mouth every other day.     No facility-administered medications prior to visit.    Allergies  Allergen Reactions  . Molds & Smuts Other (See Comments)    ROS As per HPI  PE: Blood pressure 162/95, pulse 65, temperature 98.1 F (36.7 C), temperature source Oral, resp. rate 16, height 5\' 10"  (1.778 m), weight 207 lb (93.895  kg), SpO2 96 %. Gen: Alert, well appearing.  Patient is oriented to person, place, time, and situation. CV: RRR with occ ectopic beat, no m/r/g LUNGS: CTA bilat, nonlabored resps EXT: no c/c/e  LABS:  Lab Results  Component Value Date   TSH 1.02 09/30/2011   Lab Results  Component Value Date   WBC 6.5 11/28/2015   HGB 14.1 11/28/2015   HCT 42.0 11/28/2015   MCV 82.0 11/28/2015   PLT 213 11/28/2015   Lab Results  Component Value Date   CREATININE 1.01 11/28/2015   BUN 16 11/28/2015   NA 139 11/28/2015   K 5.2* 11/28/2015   CL 107 11/28/2015   CO2 22 11/28/2015   Lab Results  Component Value Date   ALT 14 06/03/2015   AST 9 06/03/2015   ALKPHOS 87 06/03/2015   BILITOT 0.4 06/03/2015   Lab Results  Component Value Date   CHOL 155 09/25/2015   Lab Results  Component Value Date   HDL 34.80* 09/25/2015   No results found for: [facility] Lab Results  Component Value Date   TRIG 246.0* 09/25/2015   Lab Results  Component Value Date   CHOLHDL 4 09/25/2015   Lab Results  Component Value Date   PSA 0.89 06/03/2015   PSA 1.06 09/13/2013   Lab Results  Component Value Date   HGBA1C 6.4 09/25/2015    IMPRESSION AND PLAN:  1) DM 2, recent good control as per A1c 6.4% 3 mo ago. Recheck HbA1c today.  2) Elevated blood pressure w/out diagnosis of HTN:  He has been taking allegra D and mucinex D lately, so maybe the decongestant is the culprit. He will stop all decongestant and continue to monitor bp at least once a day and I'll see him back in 1 week to review these numbers. BMET today.  An After Visit Summary was printed and given to the patient.  FOLLOW UP: Return in about 1 week (around 01/02/2016) for f/u elevated bp.  Signed:  Crissie Sickles, MD           12/26/2015

## 2015-12-26 NOTE — Progress Notes (Signed)
Pre visit review using our clinic review tool, if applicable. No additional management support is needed unless otherwise documented below in the visit note. 

## 2015-12-26 NOTE — Patient Instructions (Signed)
Check your blood pressure and heart rate 1-2 times a day for the next 1 week.  Write the numbers down and bring them in for review with me in 1 wk.

## 2015-12-27 ENCOUNTER — Telehealth: Payer: Self-pay | Admitting: Family Medicine

## 2015-12-27 DIAGNOSIS — R400 Somnolence: Secondary | ICD-10-CM

## 2015-12-27 DIAGNOSIS — G4733 Obstructive sleep apnea (adult) (pediatric): Secondary | ICD-10-CM

## 2015-12-27 NOTE — Telephone Encounter (Signed)
Please advise. Thanks.  

## 2015-12-27 NOTE — Telephone Encounter (Signed)
Order placed for pulm referral as per pt request.

## 2015-12-27 NOTE — Telephone Encounter (Signed)
Patient is requesting a referral for a sleep study.

## 2015-12-30 NOTE — Telephone Encounter (Signed)
Left detailed message on home vm, okay per DPR.  

## 2016-01-02 ENCOUNTER — Encounter: Payer: Self-pay | Admitting: Pulmonary Disease

## 2016-01-02 ENCOUNTER — Ambulatory Visit (INDEPENDENT_AMBULATORY_CARE_PROVIDER_SITE_OTHER): Payer: 59 | Admitting: Pulmonary Disease

## 2016-01-02 VITALS — BP 142/80 | HR 60 | Ht 70.0 in | Wt 207.4 lb

## 2016-01-02 DIAGNOSIS — G4726 Circadian rhythm sleep disorder, shift work type: Secondary | ICD-10-CM

## 2016-01-02 DIAGNOSIS — R0683 Snoring: Secondary | ICD-10-CM

## 2016-01-02 NOTE — Patient Instructions (Signed)
Will arrange for home sleep study Will call to arrange for follow up after sleep study reviewed  

## 2016-01-02 NOTE — Progress Notes (Signed)
   Subjective:    Patient ID: Douglas [name], male    DOB: 1961-05-22, 55 y.o.   MRN: [id number]  HPI    Review of Systems  Constitutional: Negative for fever and unexpected weight change.  HENT: Positive for congestion, dental problem and trouble swallowing. Negative for ear pain, nosebleeds, postnasal drip, rhinorrhea, sinus pressure, sneezing and sore throat.   Eyes: Negative for redness and itching.  Respiratory: Positive for cough and shortness of breath. Negative for chest tightness and wheezing.   Cardiovascular: Negative for palpitations and leg swelling.  Gastrointestinal: Negative for nausea and vomiting.  Genitourinary: Negative for dysuria.  Musculoskeletal: Positive for arthralgias. Negative for joint swelling.  Skin: Negative for rash.  Neurological: Negative for headaches.  Hematological: Does not bruise/bleed easily.  Psychiatric/Behavioral: Negative for dysphoric mood. The patient is not nervous/anxious.        Objective:   Physical Exam        Assessment & Plan:

## 2016-01-02 NOTE — Progress Notes (Signed)
Past Surgical History He  has past surgical history that includes adnoids; Wisdom tooth extraction; Colonoscopy (05/2006, 05/2012); Polypectomy; and Hemorrhoid banding.  Allergies  Allergen Reactions  . Molds & Smuts Other (See Comments)    Family History His family history includes Diabetes in his father and mother; Heart disease in his father and mother; Hypertension in his father and mother.  Social History He  reports that he has never smoked. He has never used smokeless tobacco. He reports that he drinks alcohol. He reports that he does not use illicit drugs.  Review of systems Constitutional: Negative for fever and unexpected weight change.  HENT: Positive for congestion, dental problem and trouble swallowing. Negative for ear pain, nosebleeds, postnasal drip, rhinorrhea, sinus pressure, sneezing and sore throat.   Eyes: Negative for redness and itching.  Respiratory: Positive for cough and shortness of breath. Negative for chest tightness and wheezing.   Cardiovascular: Negative for palpitations and leg swelling.  Gastrointestinal: Negative for nausea and vomiting.  Genitourinary: Negative for dysuria.  Musculoskeletal: Positive for arthralgias. Negative for joint swelling.  Skin: Negative for rash.  Neurological: Negative for headaches.  Hematological: Does not bruise/bleed easily.  Psychiatric/Behavioral: Negative for dysphoric mood. The patient is not nervous/anxious.     Current Outpatient Prescriptions on File Prior to Visit  Medication Sig  . albuterol (VENTOLIN HFA) 108 (90 Base) MCG/ACT inhaler Inhale 1-2 puffs into the lungs every 4 (four) hours as needed for wheezing.  Marland Kitchen CIALIS 20 MG tablet Take 1 tablet by mouth daily as needed.  Marland Kitchen EPINEPHrine (EPIPEN) 0.3 mg/0.3 mL DEVI Inject 0.3 mLs (0.3 mg total) into the muscle once.  Marland Kitchen glucose blood test strip Check glucose bid  . metFORMIN (GLUCOPHAGE) 1000 MG tablet Take 1 tablet (1,000 mg total) by mouth 2 (two) times daily  with a meal.  . Testosterone (FORTESTA) 10 MG/ACT (2%) GEL Place 40 mg onto the skin daily. (2 pumps to each thigh)  . TURMERIC PO Take 1 capsule by mouth every other day.   No current facility-administered medications on file prior to visit.    Chief Complaint  Patient presents with  . SLEEP CONSULT    Referred by Dr Anitra Lauth. Epworth Score: 16    Tests:  Past medical history He  has a past medical history of Depression; Adenomatous colon polyp (05/07/06; 05/2012); Diverticulosis; Hypogonadism male; Rotator cuff tear (2011); Acrophobia; Insomnia; Hypertriglyceridemia; Angioedema of lips; Hemorrhoids; Generalized anxiety disorder (08/11/2012); Chronic recurrent sinusitis (02/28/2012); Asthmatic bronchitis , chronic ([place]) (01/11/2012); Hypogonadism, male (04/08/2011); Internal hemorrhoids, bleeding and sicharge (05/29/2013); and Diabetes mellitus without complication ([place]).  Vital signs BP 142/80 mmHg  Pulse 60  Ht 5\' 10"  (1.778 m)  Wt 207 lb 6.4 oz (94.076 kg)  BMI 29.76 kg/m2  SpO2 96%  History of Present Illness Douglas [name] is a 55 y.o. male for evaluation of sleep problems.  He has noticed trouble with his sleep for a while.  This has been getting worse.  He snores, and has been told he stops breathing while asleep.  He is a restless sleeper, and is always changing his sleep position.  He talks in his sleep, and wakes up frequently in the middle of dreams.    He works second shift.  He will be retiring next month.  He goes to sleep at 130 am.  He falls asleep quickly.  He wakes up several times during the night.  He gets out of bed at 6 am.  He feels tired in the  morning.  He denies morning headache.  He does not use anything to help him stay awake.  He tried melatonin before to help him sleep, but this didn't seem to make a difference.  He falls asleep easily whenever he is sitting down, and can't make it through a TV show without falling asleep.  He denies sleep walking, sleep  talking, bruxism, or nightmares.  There is no history of restless legs.  He denies sleep hallucinations, sleep paralysis, or cataplexy.  The Epworth score is 16 out of 24.   Physical Exam:  General - No distress ENT - No sinus tenderness, no oral exudate, no LAN, no thyromegaly, TM clear, pupils equal/reactive, MP 4, enlarged tongue Cardiac - s1s2 regular, no murmur, pulses symmetric Chest - No wheeze/rales/dullness, good air entry, normal respiratory excursion Back - No focal tenderness Abd - Soft, non-tender, no organomegaly, + bowel sounds Ext - No edema Neuro - Normal strength, cranial nerves intact Skin - No rashes Psych - Normal mood, and behavior  Discussion: He has snoring, sleep disruption, witnessed apnea, and daytime sleepiness.  He has hx of DM and more recently has elevated blood pressure.  I am concerned he could have obstructive sleep apnea.  We discussed how sleep apnea can affect various health problems, including risks for hypertension, cardiovascular disease, and diabetes.  We also discussed how sleep disruption can increase risks for accidents, such as while driving.  Weight loss as a means of improving sleep apnea was also reviewed.  Additional treatment options discussed were CPAP therapy, oral appliance, and surgical intervention.   Assessment/plan:  Snoring. 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 Pager [phone] 01/02/2016, 11:54 AM

## 2016-01-07 ENCOUNTER — Encounter: Payer: Self-pay | Admitting: Family Medicine

## 2016-01-07 DIAGNOSIS — R55 Syncope and collapse: Secondary | ICD-10-CM

## 2016-01-07 DIAGNOSIS — I9589 Other hypotension: Secondary | ICD-10-CM

## 2016-01-07 NOTE — Telephone Encounter (Signed)
I ordered a stress test for pt as per his request.

## 2016-01-13 ENCOUNTER — Encounter: Payer: Self-pay | Admitting: Family Medicine

## 2016-01-13 ENCOUNTER — Ambulatory Visit (INDEPENDENT_AMBULATORY_CARE_PROVIDER_SITE_OTHER): Payer: 59 | Admitting: Family Medicine

## 2016-01-13 VITALS — BP 136/86 | HR 77 | Temp 98.7°F | Resp 16 | Ht 70.0 in | Wt 207.2 lb

## 2016-01-13 DIAGNOSIS — R55 Syncope and collapse: Secondary | ICD-10-CM | POA: Diagnosis not present

## 2016-01-13 DIAGNOSIS — I9589 Other hypotension: Secondary | ICD-10-CM

## 2016-01-13 DIAGNOSIS — R030 Elevated blood-pressure reading, without diagnosis of hypertension: Secondary | ICD-10-CM | POA: Diagnosis not present

## 2016-01-13 MED ORDER — CLONIDINE HCL 0.1 MG PO TABS: ORAL_TABLET | ORAL | Status: DC

## 2016-01-13 NOTE — Progress Notes (Signed)
OFFICE VISIT  01/13/2016   CC:  Chief Complaint  Patient presents with  . Follow-up    HTN and HR. He stated that when he is active like working in the yard his BP will drop but his HR will go up.    HPI:    Patient is a 55 y.o. Caucasian male who presents for BP and HR problems.  Notes high bps: in 150-160s syst over 90s-100 diast.  With his workouts lately he feels fatigued easily and his BP goes down and HR goes up.  He has trouble being specific with HOW high the HR goes and HOW LOW the bp goes but we did document one occasion in which he had to go to the ED for this.   No CP, no SOB, no nausea.  +diaphoresis.  Some lightheadedness.  Past Medical History  Diagnosis Date  . Depression     Prozac in the past not much help.  Spontaneously resolved.  . Adenomatous colon polyp 05/07/06; 05/2012    Repeat colonoscopy 05/17/2012 showed 2 small polyps that were removed--path showed Hyperplastic-not adenomatous.  Still needs repeat 5 yrs.  . Diverticulosis   . Hypogonadism male     Felt improved on andogel but testost levels did not improve.  . Rotator cuff tear 2011    [facility]: conservative mgmt--symptoms stable as of 04/2011.  . 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  . Insomnia   . Hypertriglyceridemia     fish oil OTC  . Angioedema of lips     saw allergist 11/2011  . Hemorrhoids     ext and int  . Generalized anxiety disorder 08/11/2012  . Chronic recurrent sinusitis 02/28/2012  . Asthmatic bronchitis , chronic ([place]) 01/11/2012    Question occupational asthma with no specific agents identified. He does notice that some areas of his job are particularly dusty and dirty. Currently he is well controlled using Symbicort through an AeroChamber and rarely needing his rescue inhaler. This would be an acceptable long-term status. Elevated nonspecific total IgE and peripheral eosinophilia to suggest an atopic  trigger with sensitization to something Consider Daliresp for future use if needed   . Hypogonadism, male 04/08/2011    Several time has been <100 (saw [facility] in 2011 for this, w/u for secondary hypogonadism was unremarkable), most recent testosterone check via [facility] 10/2010 was 1.15 ng/ml.   . Internal hemorrhoids, bleeding and sicharge 05/29/2013  . Diabetes mellitus without complication [facility])     Past Surgical History  Procedure Laterality Date  . Adnoids    . Wisdom tooth extraction    . Colonoscopy  05/2006, 05/2012  . Polypectomy      Adenomatous 2007; hyperplastic 2013.  Repeat 5 yrs.  . Hemorrhoid banding      Outpatient Prescriptions Prior to Visit  Medication Sig Dispense Refill  . albuterol (VENTOLIN HFA) 108 (90 Base) MCG/ACT inhaler Inhale 1-2 puffs into the lungs every 4 (four) hours as needed for wheezing. 1 Inhaler 1  . CIALIS 20 MG tablet Take 1 tablet by mouth daily as needed.  12  . EPINEPHrine (EPIPEN) 0.3 mg/0.3 mL DEVI Inject 0.3 mLs (0.3 mg total) into the muscle once. 1 Device 3  . fluticasone (FLONASE) 50 MCG/ACT nasal spray Place 2 sprays into both nostrils daily.    Marland Kitchen glucose blood test strip Check glucose bid 100 each 12  . metFORMIN (  GLUCOPHAGE) 1000 MG tablet Take 1 tablet (1,000 mg total) by mouth 2 (two) times daily with a meal. 180 tablet 1  . Multiple Vitamins-Minerals (MULTIVITAMIN WITH MINERALS) tablet Take 1 tablet by mouth daily.    . Testosterone (FORTESTA) 10 MG/ACT (2%) GEL Place 40 mg onto the skin daily. (2 pumps to each thigh) 60 g 5  . TURMERIC PO Take 1 capsule by mouth every other day.     No facility-administered medications prior to visit.    Allergies  Allergen Reactions  . Molds & Smuts Other (See Comments)    ROS As per HPI  PE: Blood pressure 136/86, pulse 77, temperature 98.7 F (37.1 C), temperature source Oral, resp. rate 16, height 5\' 10"  (1.778 m), weight 207 lb 4 oz (94.008 kg), SpO2 93 %. Gen: Alert, well  appearing.  Patient is oriented to person, place, time, and situation. AFFECT: pleasant, lucid thought and speech. [id number]: no injection, icteris, swelling, or exudate.  EOMI, PERRLA. Mouth: lips without lesion/swelling.  Oral mucosa pink and moist. Oropharynx without erythema, exudate, or swelling.  Neck: supple/nontender.  No LAD, mass, or TM.  Carotid pulses 2+ bilaterally, without bruits.  CV: RRR, no m/r/g.  Radial and femoral pulses 2+ bilat. LUNGS: CTA bilat, nonlabored resps, good aeration in all lung fields. ABD: soft, NT, ND, BS normal.  No hepatospenomegaly or mass.  No bruits. EXT: no clubbing, cyanosis, or edema.    LABS:  None today  IMPRESSION AND PLAN:   1) Exercise induced hypotension and tachycardia--with presyncope .  EKG has been normal, as is his CV exam. He has actually had more persistent ELEVATED blood pressures lately while not exercising---in the past he has never had more than sporadic stage 1 reading and has never had to be on bp medication.  Plan: d/c our plan for myocardial perfusion imaging. Ordered echocardiogram and cardiology referral. I rx'd clonidine 0.1mg  tabs with instructions for him to use one only if his bp goes over 99991111 systolic or over 123456 diastolic.  I've recommended he do no workouts until his work-up for this problem is complete.  We discussed the slight chance that this may be autonomic instability associated with his DM 2.  An After Visit Summary was printed and given to the patient.  FOLLOW UP: Return for as needed.  Signed:  Crissie Sickles, MD           01/13/2016

## 2016-01-13 NOTE — Progress Notes (Signed)
Pre visit review using our clinic review tool, if applicable. No additional management support is needed unless otherwise documented below in the visit note. 

## 2016-01-22 ENCOUNTER — Telehealth: Payer: Self-pay | Admitting: Pulmonary Disease

## 2016-01-22 NOTE — Telephone Encounter (Signed)
Per chart and per [facility] pt has not had sleep study done yet. He is not on the list for HST machine to be picked up. Please find out when and where pt had sleep study done.  LMTCB

## 2016-01-23 NOTE — Telephone Encounter (Signed)
This pt is being called by anita walker ppc to set the pt up for next week Joellen Jersey

## 2016-01-23 NOTE — Telephone Encounter (Signed)
Patient called back - states he has not had a sleep study done. He thought he was to be called for the home sleep study, and no one has called him in regards to this. He can be reached at [phone]

## 2016-01-23 NOTE — Telephone Encounter (Signed)
Spoke with pt. He needs to be contacted for a home sleep study. Order was placed on 01/02/16.

## 2016-01-24 ENCOUNTER — Telehealth: Payer: Self-pay | Admitting: Pulmonary Disease

## 2016-01-24 DIAGNOSIS — G4733 Obstructive sleep apnea (adult) (pediatric): Secondary | ICD-10-CM | POA: Diagnosis not present

## 2016-01-24 NOTE — Telephone Encounter (Signed)
HST 01/24/16 >> AHI 53.8, SpO2 low 80%.   Will have my nurse inform pt that sleep study shows severe sleep apnea.  Options are 1) CPAP now, 2) ROV first.  If He is agreeable to CPAP, then please send order for auto CPAP range 5 to 15 cm H2O with heated humidity and mask of choice.  Have download sent 1 month after starting CPAP and set up ROV 2 months after starting CPAP >> can be with me or nurse practitioner.

## 2016-01-30 ENCOUNTER — Ambulatory Visit (HOSPITAL_COMMUNITY): Payer: 59 | Attending: Cardiology

## 2016-01-30 ENCOUNTER — Other Ambulatory Visit: Payer: Self-pay

## 2016-01-30 ENCOUNTER — Encounter: Payer: Self-pay | Admitting: Family Medicine

## 2016-01-30 DIAGNOSIS — I77810 Thoracic aortic ectasia: Secondary | ICD-10-CM | POA: Diagnosis not present

## 2016-01-30 DIAGNOSIS — R55 Syncope and collapse: Secondary | ICD-10-CM | POA: Diagnosis not present

## 2016-01-30 DIAGNOSIS — I517 Cardiomegaly: Secondary | ICD-10-CM | POA: Diagnosis not present

## 2016-01-30 DIAGNOSIS — E119 Type 2 diabetes mellitus without complications: Secondary | ICD-10-CM | POA: Diagnosis not present

## 2016-01-30 HISTORY — PX: TRANSTHORACIC ECHOCARDIOGRAM: SHX275

## 2016-01-31 ENCOUNTER — Telehealth: Payer: Self-pay | Admitting: *Deleted

## 2016-01-31 NOTE — Telephone Encounter (Signed)
Light exercises are fine. Make sure he is still going to see the cardiologist.-thx

## 2016-01-31 NOTE — Telephone Encounter (Signed)
Pt advised and voiced understanding.   

## 2016-01-31 NOTE — Telephone Encounter (Signed)
Results have been explained to patient, pt expressed understanding. Order placed for CPAP machine. Pt scheduled for OV with VS 04/27/16 at 10am. Nothing further needed.

## 2016-01-31 NOTE — Telephone Encounter (Signed)
Pt LMOM on 01/31/16 at 10:37am wanting to know if he could start doing light exercises with his trainer again since his Holter monitor came back normal. Please advise. Thanks.

## 2016-02-04 DIAGNOSIS — G4733 Obstructive sleep apnea (adult) (pediatric): Secondary | ICD-10-CM | POA: Diagnosis not present

## 2016-02-05 ENCOUNTER — Other Ambulatory Visit: Payer: Self-pay | Admitting: *Deleted

## 2016-02-05 DIAGNOSIS — R0683 Snoring: Secondary | ICD-10-CM

## 2016-02-14 ENCOUNTER — Other Ambulatory Visit: Payer: Self-pay | Admitting: *Deleted

## 2016-02-14 MED ORDER — METFORMIN HCL 1000 MG PO TABS: 1000.0000 mg | ORAL_TABLET | Freq: Two times a day (BID) | ORAL | Status: DC

## 2016-02-14 MED ORDER — TESTOSTERONE 10 MG/ACT (2%) TD GEL: 40.0000 mg | Freq: Every day | TRANSDERMAL | Status: DC

## 2016-02-14 NOTE — Telephone Encounter (Signed)
RF request for testosterone 10mg  GEL LOV: 01/13/16 Next ov: None Last written: 06/03/15 #60 w/ 5RF  Please advise. Thanks.   RF request for metformin - Rx sent. Last written: 07/22/15 #180 w/ 1RF

## 2016-02-17 NOTE — Telephone Encounter (Signed)
Rx faxed

## 2016-03-02 ENCOUNTER — Telehealth: Payer: Self-pay | Admitting: Pulmonary Disease

## 2016-03-02 NOTE — Telephone Encounter (Signed)
lmomtcb x1 

## 2016-03-04 NOTE — Telephone Encounter (Signed)
Spoke with pt. States that he is having issues with using CPAP at night. His nose becomes congested when using the CPAP. He then starts breathing through his mouth and he feels like he can't move enough air. APS advised him change his RAMP setting but this hasn't helped.  VS - please advise. Thanks.

## 2016-03-09 NOTE — Telephone Encounter (Signed)
Please have current CPAP download sent and have his DME refit his CPAP mask.

## 2016-03-09 NOTE — Telephone Encounter (Signed)
lmtcb x1 

## 2016-03-10 NOTE — Telephone Encounter (Signed)
lmtcb for pt.  

## 2016-03-11 ENCOUNTER — Ambulatory Visit (INDEPENDENT_AMBULATORY_CARE_PROVIDER_SITE_OTHER): Payer: Commercial Managed Care - HMO | Admitting: Cardiology

## 2016-03-11 ENCOUNTER — Encounter: Payer: Self-pay | Admitting: Cardiology

## 2016-03-11 VITALS — BP 154/91 | HR 81 | Ht 70.0 in | Wt 213.2 lb

## 2016-03-11 DIAGNOSIS — I951 Orthostatic hypotension: Secondary | ICD-10-CM | POA: Diagnosis not present

## 2016-03-11 DIAGNOSIS — R6889 Other general symptoms and signs: Secondary | ICD-10-CM

## 2016-03-11 NOTE — Patient Instructions (Signed)
Medication Instructions:  Your physician recommends that you continue on your current medications as directed. Please refer to the Current Medication list given to you today.   Labwork: None  Testing/Procedures: Dr. Radford Pax recommends you have a NUCLEAR STRESS TEST.  Follow-Up: Your physician recommends that you schedule a follow-up appointment AS NEEDED with Dr. Radford Pax.  Any Other Special Instructions Will Be Listed Below (If Applicable).     If you need a refill on your cardiac medications before your next appointment, please call your pharmacy.

## 2016-03-11 NOTE — Progress Notes (Signed)
Cardiology Office Note    Date:  03/11/2016   ID:  Douglas [name], DOB Sep 26, 1960, MRN [id number]  PCP:  Tammi Sou, MD  Cardiologist:  Fransico Him, MD   Chief Complaint  Patient presents with  . Dizziness    History of Present Illness:  Douglas [name] is a 55 y.o. male with a history of exercise induced hypotension and tachycardia with presyncope.  2D echo showed normal LVF with no valvular abnormalities.  He says when he works out he gets fatigued easily and BP will drop and then go up.  He had a similar experience several years ago and had a stress test that was normal.  This usually occurs with exercise.  He works with a Clinical research associate and back in May was doing pushups at the end of his workout and his energy level dropped significantly.  After the pushups he was doing dumbell presses with his arms and started having weakness, diaphoresis, hand and feet tingling and felt like he was going to pass out.   On arrival of EMS his BP 70/30mmHg and he was given a saline bolus and BP improved to 112/24mmHg.  He apparently did not eat or drink prior to working out . He was taken to the ER by EMS.  Cardiac workup was normal.  He bought a BP machine and moved some wood for an hour and felt similar symptoms and checked his BP and it was 123XX123 systolic and HR was [id number] and took a while to get below 80 after resting.  He has cut his workouts back some and has not had any episodes as sever as the one in March.  He denies any chest pain or pressure.  He has not had any frank syncope.  He denies any palpitations, LE edema, claudication symptoms.      Past Medical History  Diagnosis Date  . Depression     Prozac in the past not much help.  Spontaneously resolved.  . Adenomatous colon polyp 05/07/06; 05/2012    Repeat colonoscopy 05/17/2012 showed 2 small polyps that were removed--path showed Hyperplastic-not adenomatous.  Still needs repeat 5 yrs.  . Diverticulosis   . Hypogonadism male     Felt  improved on andogel but testost levels did not improve.  . Rotator cuff tear 2011    [facility]: conservative mgmt--symptoms stable as of 04/2011.  . 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  . Insomnia   . Hypertriglyceridemia     fish oil OTC  . Angioedema of lips     saw allergist 11/2011  . Hemorrhoids     ext and int  . Generalized anxiety disorder 08/11/2012  . Chronic recurrent sinusitis 02/28/2012  . Asthmatic bronchitis , chronic ([place]) 01/11/2012    Question occupational asthma with no specific agents identified. He does notice that some areas of his job are particularly dusty and dirty. Currently he is well controlled using Symbicort through an AeroChamber and rarely needing his rescue inhaler. This would be an acceptable long-term status. Elevated nonspecific total IgE and peripheral eosinophilia to suggest an atopic trigger with sensitization to something Consider Daliresp for future use if needed   . Hypogonadism, male 04/08/2011    Several time has been <100 (saw [facility] in 2011 for this, w/u for secondary hypogonadism was unremarkable), most recent testosterone check via [facility] 10/2010 was 1.15 ng/ml.   . Internal hemorrhoids,  bleeding and sicharge 05/29/2013  . Diabetes mellitus without complication [facility])     Past Surgical History  Procedure Laterality Date  . Adnoids    . Wisdom tooth extraction    . Colonoscopy  05/2006, 05/2012  . Polypectomy      Adenomatous 2007; hyperplastic 2013.  Repeat 5 yrs.  . Hemorrhoid banding    . Transthoracic echocardiogram  01/30/16    Normal except mild aortic dilatation    Current Medications: Outpatient Prescriptions Prior to Visit  Medication Sig Dispense Refill  . albuterol (VENTOLIN HFA) 108 (90 Base) MCG/ACT inhaler Inhale 1-2 puffs into the lungs every 4 (four) hours as needed for wheezing. 1 Inhaler 1  . CIALIS 20 MG tablet Take 1 tablet by mouth  daily as needed. Reported on 03/11/2016  12  . EPINEPHrine (EPIPEN) 0.3 mg/0.3 mL DEVI Inject 0.3 mLs (0.3 mg total) into the muscle once. 1 Device 3  . glucose blood test strip Check glucose bid 100 each 12  . metFORMIN (GLUCOPHAGE) 1000 MG tablet Take 1 tablet (1,000 mg total) by mouth 2 (two) times daily with a meal. 180 tablet 1  . Multiple Vitamins-Minerals (MULTIVITAMIN WITH MINERALS) tablet Take 1 tablet by mouth daily.    . Testosterone (FORTESTA) 10 MG/ACT (2%) GEL Place 40 mg onto the skin daily. (2 pumps to each thigh) 60 g 5  . TURMERIC PO Take 1 capsule by mouth every other day.    . cloNIDine (CATAPRES) 0.1 MG tablet 1 tab po every 12 hours IF BP GETS HIGHER THAN 99991111 SYSTOLIC or 123456 DIASTOLIC (Patient not taking: Reported on 03/11/2016) 10 tablet 1  . fluticasone (FLONASE) 50 MCG/ACT nasal spray Place 2 sprays into both nostrils daily. Reported on 03/11/2016     No facility-administered medications prior to visit.     Allergies:   Molds & smuts   Social History   Social History  . Marital Status: Married    Spouse Name: N/A  . Number of Children: 1  . Years of Education: N/A   Occupational History  . 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 Smoking status: Never Smoker   . Smokeless tobacco: Never Used  . Alcohol Use: 0.0 oz/week    0 Standard drinks or equivalent per week     Comment: rarely  . Drug Use: No  . Sexual Activity: Not Asked   Other Topics Concern  . None   Social History Narrative   Married, one 83 y/o daughter, wife is [place].   Grew up in [place] in [place] family (four sisters).   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   No T/A/Ds.  Works out regularly.     Family History:  The patient's family history includes CAD in his father; CVA in his mother; Dementia in his father; Diabetes in his father and mother; Heart disease in his father; Hypertension in his father and mother.   ROS:   Please see  the history of present illness.    ROS All other systems reviewed and are negative.   PHYSICAL EXAM:   VS:  BP 154/91 mmHg  Pulse 81  Ht 5\' 10"  (1.778 m)  Wt 213 lb 3.2 oz (96.707 kg)  BMI 30.59 kg/m2   GEN: Well nourished, well developed, in no acute distress HEENT: normal Neck: no JVD, carotid bruits, or masses Cardiac: RRR; no murmurs, rubs, or gallops,no edema.  Intact distal pulses bilaterally.  Respiratory:  clear  to auscultation bilaterally, normal work of breathing GI: soft, nontender, nondistended, + BS MS: no deformity or atrophy Skin: warm and dry, no rash Neuro:  Alert and Oriented x 3, Strength and sensation are intact Psych: euthymic mood, full affect  Wt Readings from Last 3 Encounters:  03/11/16 213 lb 3.2 oz (96.707 kg)  01/13/16 207 lb 4 oz (94.008 kg)  01/02/16 207 lb 6.4 oz (94.076 kg)      Studies/Labs Reviewed:   EKG:  EKG is ordered today and showed NSR with nonspecific T wave abnormality  Recent Labs: 06/03/2015: ALT 14 11/28/2015: Hemoglobin 14.1; Platelets 213 12/26/2015: BUN 14; Creatinine, Ser 0.77; Potassium 4.4; Sodium 141   Lipid Panel    Component Value Date/Time   CHOL 155 09/25/2015 0803   TRIG 246.0* 09/25/2015 0803   HDL 34.80* 09/25/2015 0803   CHOLHDL 4 09/25/2015 0803   VLDL 49.2* 09/25/2015 0803   LDLDIRECT 83.0 09/25/2015 0803    Additional studies/ records that were reviewed today include:  none    ASSESSMENT:    1. Orthostatic hypotension   2. Exercise intolerance      PLAN:  In order of problems listed above:  1. Orthostatic hypotension - he is not orthostatic on exam today but prior symptoms sound consistent with this diagnosis.  It seems like each time he has these symptoms he has not eaten or drank prior to exercise.  The day he was carrying wood and had similar symptoms it was very hot outside and his BP was still in the normal range.  I have encouraged him to eat and drink prior to going to do a workout and  to stay well hydrated and avoid working outside in the extreme heat. 2. Exercise intolerance - he has had problems with DOE during exercise in the past and had a cardiopulmonary test in 2014 that was normal.  He is now wearing out quickly with exercise.  EKG is nonischemic but he does have a family history of CAD in his father.  I will get a stress myoview to rule out ischemia as well as assess his BP and HR during exercise.     Medication Adjustments/Labs and Tests Ordered: Current medicines are reviewed at length with the patient today.  Concerns regarding medicines are outlined above.  Medication changes, Labs and Tests ordered today are listed in the Patient Instructions below.  There are no Patient Instructions on file for this visit.   Signed, Fransico Him, MD  03/11/2016 10:04 AM    [address], [place], [place]  [address] Phone: [phone]; Fax: [phone]

## 2016-03-11 NOTE — Telephone Encounter (Signed)
lmtcb x3 for pt. 

## 2016-03-12 NOTE — Telephone Encounter (Signed)
Spoke with pt, states that he does not need a mask refit.  I attempted to obtain a download through [facility] but was unsuccessful.  Pt will take machine to his DME company for download.  Will await download.

## 2016-03-16 ENCOUNTER — Telehealth (HOSPITAL_COMMUNITY): Payer: Self-pay | Admitting: *Deleted

## 2016-03-16 NOTE — Telephone Encounter (Signed)
Download printed off and placed in Dr. Halford Chessman look at. Please advise thanks

## 2016-03-16 NOTE — Telephone Encounter (Signed)
Douglas [name] from [facility] pt is linked with [facility]

## 2016-03-16 NOTE — Telephone Encounter (Signed)
Patient given detailed instructions per Myocardial Perfusion Study Information Sheet for the test on 03/19/16 at 0745. Patient notified to arrive 15 minutes early and that it is imperative to arrive on time for appointment to keep from having the test rescheduled.  If you need to cancel or reschedule your appointment, please call the office within 24 hours of your appointment. Failure to do so may result in a cancellation of your appointment, and a $50 no show fee. Patient verbalized understanding.Kahliyah Dick, [place] [place]

## 2016-03-17 NOTE — Telephone Encounter (Signed)
Spoke with pt. He is aware of his download results. States that he is using a full face mask and that it has tiny holes in the top of the mask but the mask came like this.  VS - please advise. Thanks.

## 2016-03-17 NOTE — Telephone Encounter (Signed)
lmomtcb x1 for pt 

## 2016-03-17 NOTE — Telephone Encounter (Signed)
Spoke with pt. He is aware of the below information. States that he will contact his DME. Nothing further was needed.

## 2016-03-17 NOTE — Telephone Encounter (Signed)
He should have his mask refit if he is okay with this.  This can be done with his DME, or can place order to have mask refitting done at sleep lab.

## 2016-03-17 NOTE — Telephone Encounter (Signed)
Auto CPAP 02/15/16 to 03/15/16 >> used on 30 of 30 nights with average 4 hrs 45 min.  Average AHI 0.3 with median CPAP 6 cm H2O and 95 th percentile CPAP 8 cm H2O.  Will have my nurse inform pt that CPAP report shows good control of sleep apnea with current settings.  He did have air leak detected from his mask.  I am not sure what type of CPAP mask he is using.  If he is using a nasal mask or nasal pillows mask, then he could try using a chin strap with his mask.  Alternatively, he could change to a full face mask.

## 2016-03-19 ENCOUNTER — Other Ambulatory Visit: Payer: Self-pay | Admitting: Cardiology

## 2016-03-19 ENCOUNTER — Encounter: Payer: Self-pay | Admitting: Family Medicine

## 2016-03-19 ENCOUNTER — Ambulatory Visit (HOSPITAL_COMMUNITY): Payer: Commercial Managed Care - HMO | Attending: Cardiology

## 2016-03-19 ENCOUNTER — Telehealth: Payer: Self-pay

## 2016-03-19 DIAGNOSIS — R0609 Other forms of dyspnea: Secondary | ICD-10-CM | POA: Insufficient documentation

## 2016-03-19 DIAGNOSIS — I951 Orthostatic hypotension: Secondary | ICD-10-CM

## 2016-03-19 DIAGNOSIS — Z8249 Family history of ischemic heart disease and other diseases of the circulatory system: Secondary | ICD-10-CM | POA: Diagnosis not present

## 2016-03-19 DIAGNOSIS — I10 Essential (primary) hypertension: Secondary | ICD-10-CM

## 2016-03-19 DIAGNOSIS — R6889 Other general symptoms and signs: Secondary | ICD-10-CM | POA: Insufficient documentation

## 2016-03-19 DIAGNOSIS — R55 Syncope and collapse: Secondary | ICD-10-CM | POA: Diagnosis not present

## 2016-03-19 LAB — MYOCARDIAL PERFUSION IMAGING
CHL CUP NUCLEAR SDS: 0
CHL CUP NUCLEAR SRS: 0
CHL CUP RESTING HR STRESS: 68 {beats}/min
CSEPEDS: 0 s
CSEPEW: 11.7 METS
CSEPHR: 90 %
Exercise duration (min): 10 min
LV dias vol: 104 mL (ref 62–150)
LV sys vol: 40 mL
MPHR: 164 {beats}/min
Peak HR: 148 {beats}/min
RATE: 0.33
SSS: 0
TID: 0.9

## 2016-03-19 MED ORDER — TECHNETIUM TC 99M TETROFOSMIN IV KIT
10.1000 | PACK | Freq: Once | INTRAVENOUS | Status: AC | PRN
  Administered 2016-03-19: 10.1 via INTRAVENOUS
  Filled 2016-03-19: qty 10

## 2016-03-19 MED ORDER — TECHNETIUM TC 99M TETROFOSMIN IV KIT
32.7000 | PACK | Freq: Once | INTRAVENOUS | Status: AC | PRN
  Administered 2016-03-19: 32.7 via INTRAVENOUS
  Filled 2016-03-19: qty 33

## 2016-03-19 NOTE — Telephone Encounter (Signed)
Informed patient of results and verbal understanding expressed.  24 hour BP monitor ordered for scheduling. Patient agrees with treatment plan. 

## 2016-03-19 NOTE — Telephone Encounter (Signed)
-----   Message from Sueanne Margarita, MD sent at 03/19/2016  2:09 PM EDT ----- Normal stress test with hypertensive BP response - please get a 24 hour BP monitor to assess BP

## 2016-03-24 ENCOUNTER — Ambulatory Visit (INDEPENDENT_AMBULATORY_CARE_PROVIDER_SITE_OTHER): Payer: Commercial Managed Care - HMO

## 2016-03-24 ENCOUNTER — Encounter: Payer: Self-pay | Admitting: *Deleted

## 2016-03-24 DIAGNOSIS — I951 Orthostatic hypotension: Secondary | ICD-10-CM

## 2016-03-24 NOTE — Progress Notes (Signed)
Patient ID: Douglas [name], male   DOB: 16-May-1961, 55 y.o.   MRN: [id number] 24 hour ambulatory blood pressure monitor applied to patient.

## 2016-04-06 ENCOUNTER — Telehealth: Payer: Self-pay | Admitting: Cardiology

## 2016-04-06 NOTE — Telephone Encounter (Signed)
New message ° ° ° ° ° °Calling to get test results °

## 2016-04-06 NOTE — Telephone Encounter (Signed)
Informed patient Dr. Radford Pax has not forwarded results and he will be called as soon as they are resulted.  He was grateful for call.

## 2016-04-07 MED ORDER — AMLODIPINE BESYLATE 2.5 MG PO TABS: 2.5000 mg | ORAL_TABLET | Freq: Every day | ORAL | 11 refills | Status: DC

## 2016-04-07 NOTE — Addendum Note (Signed)
Addended by: Harland German A on: 04/07/2016 05:09 PM   Modules accepted: Orders

## 2016-04-07 NOTE — Telephone Encounter (Signed)
Per Dr. Radford Pax, ambulatory BP report showed an average BP of 159/90. Patient is to START AMLODIPINE 2.5 mg daily. HTN Clinic OV scheduled 8/15. Patient agrees with treatment plan.

## 2016-04-09 NOTE — Addendum Note (Signed)
Addended by: Freada Bergeron on: 04/09/2016 05:24 PM   Modules accepted: Orders

## 2016-04-14 ENCOUNTER — Telehealth: Payer: Self-pay

## 2016-04-14 ENCOUNTER — Ambulatory Visit (INDEPENDENT_AMBULATORY_CARE_PROVIDER_SITE_OTHER): Payer: Commercial Managed Care - HMO | Admitting: Pharmacist

## 2016-04-14 DIAGNOSIS — I10 Essential (primary) hypertension: Secondary | ICD-10-CM | POA: Diagnosis not present

## 2016-04-14 MED ORDER — AMLODIPINE BESYLATE 5 MG PO TABS: 5.0000 mg | ORAL_TABLET | Freq: Every day | ORAL | 6 refills | Status: DC

## 2016-04-14 NOTE — Telephone Encounter (Signed)
I am fine with that 

## 2016-04-14 NOTE — Patient Instructions (Signed)
1.  Increase your amlodipine to 5mg  once daily.    2.  Please check your BP once daily and send the log to Douglas [name] after 2 weeks.  We will look at the numbers to see if we need to adjust your medications.

## 2016-04-14 NOTE — Telephone Encounter (Signed)
Patient was in today for HTN Clinic OV.  Per Gay Filler, [facility], "Pt has stopped working out with his personal trainer since his stress test in July.  He is requesting a note from Dr. Radford Pax to help him discontinue his personal training sessions until his BP is better controlled."  The patient purchased personal training classes from [facility] and was told he would be reimbursed if Dr. Radford Pax wrote him a note. Genevie Cheshire tele: [phone]  To Dr. Radford Pax.

## 2016-04-14 NOTE — Progress Notes (Signed)
Patient ID: Douglas [name]                 DOB: 31-Aug-1961                      MRN: [id number]     HPI: Douglas [name] is a 55 y.o. male referred by Dr. Radford Pax to HTN clinic.  He was first seen by D. Turner on 7/12 for orthostatic hypotension.  He had a episode of presyncope while working out and his BP was 70/40.  He had an exercise stress test on 03/19/16 and his BP was actually extremely elevated during the test.  Dr. Radford Pax recommended an ambulatory BP monitor.  Pt worse this on 7/25.  His average BP was 164/93 while awake, 145/83 while asleep and 149/90 overall.  He had a normal dipping pattern.  Dr. Radford Pax added amlodipine 2.5mg  daily and pt is here today for follow up.   Pt reports no differences since starting amlodipine.  No headache, swelling, etc.    Current HTN meds: amlodipine 2.5mg  daily Previously tried: none BP goal: <140/90  Diet: Pt drinks at least 1 caffeinated drink per day. He does not add salt but states his wife does cook with salty ingredients.   Exercise: Pt has stopped working out with his Physiological scientist since his stress test in July.  He is requesting a note from Dr. Radford Pax to help him discontinue his personal training sessions until his BP is better controlled.   Home BP readings: Pt states he checks his BP up to 6 times per day.  He logs them at home but did not bring his list with him.  He thinks they have been in the [id number] systolic.    Wt Readings from Last 3 Encounters:  03/19/16 213 lb (96.6 kg)  03/11/16 213 lb 3.2 oz (96.7 kg)  01/13/16 207 lb 4 oz (94 kg)   BP Readings from Last 3 Encounters:  04/14/16 (!) 152/90  03/11/16 (!) 154/91  01/13/16 136/86   Pulse Readings from Last 3 Encounters:  04/14/16 81  03/11/16 81  01/13/16 77    Renal function: CrCl cannot be calculated (Unknown ideal weight.).  Past Medical History:  Diagnosis Date  . Acrophobia    Occasionally requires xanax when he is required to work in high places as  part of his occupation.  . Adenomatous colon polyp 05/07/06; 05/2012   Repeat colonoscopy 05/17/2012 showed 2 small polyps that were removed--path showed Hyperplastic-not adenomatous.  Still needs repeat 5 yrs.  . Angioedema of lips    saw allergist 11/2011  . Asthmatic bronchitis , chronic ([place]) 01/11/2012   Question occupational asthma with no specific agents identified. He does notice that some areas of his job are particularly dusty and dirty. Currently he is well controlled using Symbicort through an AeroChamber and rarely needing his rescue inhaler. This would be an acceptable long-term status. Elevated nonspecific total IgE and peripheral eosinophilia to suggest an atopic trigger with sensitization to something Consider Daliresp for future use if needed   . Chronic recurrent sinusitis 02/28/2012  . Depression    Prozac in the past not much help.  Spontaneously resolved.  . Diabetes mellitus without complication ([place])   . Diverticulosis   . Generalized anxiety disorder 08/11/2012  . Hemorrhoids    ext and int  . Hypertriglyceridemia    fish oil OTC  . Hypogonadism male    Felt improved on andogel but testost levels did not improve.  Marland Kitchen  Hypogonadism, male 04/08/2011   Several time has been <100 (saw [facility] in 2011 for this, w/u for secondary hypogonadism was unremarkable), most recent testosterone check via [facility] 10/2010 was 1.15 ng/ml.   . Insomnia   . Internal hemorrhoids, bleeding and sicharge 05/29/2013  . Rotator cuff tear 2011   [facility]: conservative mgmt--symptoms stable as of 04/2011.    Current Outpatient Prescriptions on File Prior to Visit  Medication Sig Dispense Refill  . albuterol (VENTOLIN HFA) 108 (90 Base) MCG/ACT inhaler Inhale 1-2 puffs into the lungs every 4 (four) hours as needed for wheezing. 1 Inhaler 1  . CIALIS 20 MG tablet Take 1 tablet by mouth daily as needed. Reported on 03/11/2016  12  . EPINEPHrine (EPIPEN) 0.3 mg/0.3 mL DEVI Inject 0.3 mLs  (0.3 mg total) into the muscle once. 1 Device 3  . glucose blood test strip Check glucose bid 100 each 12  . metFORMIN (GLUCOPHAGE) 1000 MG tablet Take 1 tablet (1,000 mg total) by mouth 2 (two) times daily with a meal. 180 tablet 1  . Multiple Vitamins-Minerals (MULTIVITAMIN WITH MINERALS) tablet Take 1 tablet by mouth daily.    . Testosterone (FORTESTA) 10 MG/ACT (2%) GEL Place 40 mg onto the skin daily. (2 pumps to each thigh) 60 g 5  . TURMERIC PO Take 1 capsule by mouth every other day.     No current facility-administered medications on file prior to visit.     Allergies  Allergen Reactions  . Molds & Smuts Other (See Comments)     Assessment/Plan:  1.  Hypertension- Pt's BP still elevated today.  Will increase amlodipine to 5mg  daily.  Plan to have patient check his BP at home and send [place] the results through [place].  If BP is still elevated, may need to consider increasing amlodipine to 10mg  daily.

## 2016-04-15 NOTE — Telephone Encounter (Signed)
Patient states he needs a note to discontinue his personal training sessions until further notice due to medical reasons.   He requests a note sent through MyChart for him to print and take to [employer].  Message sent.

## 2016-04-19 ENCOUNTER — Encounter: Payer: Self-pay | Admitting: Family Medicine

## 2016-04-27 ENCOUNTER — Ambulatory Visit (INDEPENDENT_AMBULATORY_CARE_PROVIDER_SITE_OTHER): Payer: Commercial Managed Care - HMO | Admitting: Pulmonary Disease

## 2016-04-27 ENCOUNTER — Encounter: Payer: Self-pay | Admitting: Pulmonary Disease

## 2016-04-27 VITALS — BP 128/70 | HR 81 | Ht 70.0 in | Wt 210.6 lb

## 2016-04-27 DIAGNOSIS — G4733 Obstructive sleep apnea (adult) (pediatric): Secondary | ICD-10-CM

## 2016-04-27 DIAGNOSIS — J310 Chronic rhinitis: Secondary | ICD-10-CM | POA: Diagnosis not present

## 2016-04-27 NOTE — Patient Instructions (Signed)
Try increasing temperature setting on your CPAP humidifier  Try using nasal irrigation (saline nasal spray) nightly before bedtime  Can try flonase or nasacort one spray in each nostril nightly >> you can get these over the counter without a prescription  Follow up in 6 months

## 2016-04-27 NOTE — Progress Notes (Signed)
Current Outpatient Prescriptions on File Prior to Visit  Medication Sig  . albuterol (VENTOLIN HFA) 108 (90 Base) MCG/ACT inhaler Inhale 1-2 puffs into the lungs every 4 (four) hours as needed for wheezing.  Marland Kitchen amLODipine (NORVASC) 5 MG tablet Take 1 tablet (5 mg total) by mouth daily.  Marland Kitchen CIALIS 20 MG tablet Take 1 tablet by mouth daily as needed. Reported on 03/11/2016  . EPINEPHrine (EPIPEN) 0.3 mg/0.3 mL DEVI Inject 0.3 mLs (0.3 mg total) into the muscle once.  Marland Kitchen glucose blood test strip Check glucose bid  . metFORMIN (GLUCOPHAGE) 1000 MG tablet Take 1 tablet (1,000 mg total) by mouth 2 (two) times daily with a meal.  . Multiple Vitamins-Minerals (MULTIVITAMIN WITH MINERALS) tablet Take 1 tablet by mouth daily.  . Testosterone (FORTESTA) 10 MG/ACT (2%) GEL Place 40 mg onto the skin daily. (2 pumps to each thigh)  . TURMERIC PO Take 1 capsule by mouth every other day.   No current facility-administered medications on file prior to visit.      Chief Complaint  Patient presents with  . Follow-up    Pt reports he does not feel like he is able to get a good deep breath when using CPAP, nose is congested, throat burning at times. When he wears the  CPAP it makes all the symptoms worse.      Tests HST 01/24/16 >> AHI 53.8, SaO2 low 80% Auto CPAP 03/28/16 to 04/26/16 >> used on 30 of 30 nights with average 3 hrs 45 min.  Average AHI 0.3 with median CPAP 6 and 95 th percentile CPAP 8 cm H2O  Past medical hx Asthma, Chronic sinusitis, Depression, Anxiety, Acrophobia, DM, HTN, HLD, Diverticulosis, Hypogonadism  Past surgical hx, Allergies, Family hx, Social hx all reviewed.  Vital Signs BP 128/70 (BP Location: Left Arm, Cuff Size: Large)   Pulse 81   Ht 5\' 10"  (1.778 m)   Wt 210 lb 9.6 oz (95.5 kg)   SpO2 97%   BMI 30.22 kg/m   History of Present Illness Douglas [name] is a 55 y.o. male with obstructive sleep apnea.  He uses CPAP nightly.  He has trouble with nasal congestion, and  dry mouth.  He has hybrid mask.  He feels mask fits well.  He feels CPAP helps his sleep, and daytime alertness.   He has hx of nasal septal deviation.  He has tried saline spray and nasal steroids before, but not recently.  He used breath rite strips before, but not recently.  He was seen several yrs ago by ENT.    Physical Exam  General - No distress ENT - No sinus tenderness, no oral exudate, no LAN, nasal septal deviation Cardiac - s1s2 regular, no murmur Chest - No wheeze/rales/dullness Back - No focal tenderness Abd - Soft, non-tender Ext - No edema Neuro - Normal strength Skin - No rashes Psych - normal mood, and behavior   Assessment/Plan  Obstructive sleep apnea. - continue auto CPAP - he does report improvement with CPAP  CPAP rhinitis. - advised him to increase temperature on his humidifier - he can try nasal irrigation and OTC nasal steroid  Nasal septal deviation. - he might need assessment by ENT if he continues to have trouble using CPAP - he can try breath rite strips   Patient Instructions  Try increasing temperature setting on your CPAP humidifier  Try using nasal irrigation (saline nasal spray) nightly before bedtime  Can try flonase or nasacort one spray in each nostril  nightly >> you can get these over the counter without a prescription  Follow up in 6 months    Chesley Mires, MD [facility] Pager:  [phone] 04/27/2016, 10:40 AM

## 2016-04-28 ENCOUNTER — Encounter: Payer: Self-pay | Admitting: Family Medicine

## 2016-05-13 ENCOUNTER — Encounter: Payer: Self-pay | Admitting: Cardiology

## 2016-05-19 ENCOUNTER — Encounter: Payer: Self-pay | Admitting: Cardiology

## 2016-05-19 ENCOUNTER — Telehealth: Payer: Self-pay | Admitting: *Deleted

## 2016-05-19 NOTE — Telephone Encounter (Addendum)
BP still elevated - increase amlodipine to 10mg  daily and repeat BP check x 1 week   Traci Turner     Left message to call back

## 2016-05-19 NOTE — Telephone Encounter (Deleted)
BP still elevated - increase amlodipine to 10mg  daily and repeat BP check x 1 week   Fransico Him

## 2016-05-21 MED ORDER — AMLODIPINE BESYLATE 10 MG PO TABS: 10.0000 mg | ORAL_TABLET | Freq: Every day | ORAL | 3 refills | Status: DC

## 2016-05-21 NOTE — Telephone Encounter (Signed)
Informed patient to increase his amlodipine to 10 mg once a day, keep a record of BPs again x 1 week and email readings to Dr. Radford Pax.  He verbalizes understanding and agreement.  New prescription sent to his pharmacy for increased dose of amlodipine.

## 2016-06-05 ENCOUNTER — Encounter: Payer: Self-pay | Admitting: Cardiology

## 2016-06-12 ENCOUNTER — Other Ambulatory Visit: Payer: Self-pay

## 2016-06-12 MED ORDER — CIALIS 20 MG PO TABS: 20.0000 mg | ORAL_TABLET | Freq: Every day | ORAL | 12 refills | Status: DC | PRN

## 2016-06-12 NOTE — Telephone Encounter (Signed)
Refill sent.

## 2016-06-15 ENCOUNTER — Encounter: Payer: Self-pay | Admitting: Cardiology

## 2016-06-16 ENCOUNTER — Emergency Department (HOSPITAL_BASED_OUTPATIENT_CLINIC_OR_DEPARTMENT_OTHER)
Admission: EM | Admit: 2016-06-16 | Discharge: 2016-06-16 | Disposition: A | Discharge status: Home / Self Care | Payer: Commercial Managed Care - HMO | Attending: Emergency Medicine | Admitting: Emergency Medicine

## 2016-06-16 ENCOUNTER — Emergency Department (HOSPITAL_BASED_OUTPATIENT_CLINIC_OR_DEPARTMENT_OTHER): Payer: Commercial Managed Care - HMO

## 2016-06-16 ENCOUNTER — Encounter (HOSPITAL_BASED_OUTPATIENT_CLINIC_OR_DEPARTMENT_OTHER): Payer: Self-pay | Admitting: Emergency Medicine

## 2016-06-16 DIAGNOSIS — Z7984 Long term (current) use of oral hypoglycemic drugs: Secondary | ICD-10-CM | POA: Insufficient documentation

## 2016-06-16 DIAGNOSIS — R109 Unspecified abdominal pain: Secondary | ICD-10-CM | POA: Diagnosis not present

## 2016-06-16 DIAGNOSIS — Z7951 Long term (current) use of inhaled steroids: Secondary | ICD-10-CM | POA: Diagnosis not present

## 2016-06-16 DIAGNOSIS — I10 Essential (primary) hypertension: Secondary | ICD-10-CM | POA: Diagnosis not present

## 2016-06-16 DIAGNOSIS — R0789 Other chest pain: Secondary | ICD-10-CM

## 2016-06-16 DIAGNOSIS — R062 Wheezing: Secondary | ICD-10-CM | POA: Insufficient documentation

## 2016-06-16 DIAGNOSIS — E119 Type 2 diabetes mellitus without complications: Secondary | ICD-10-CM | POA: Diagnosis not present

## 2016-06-16 DIAGNOSIS — Z79899 Other long term (current) drug therapy: Secondary | ICD-10-CM | POA: Insufficient documentation

## 2016-06-16 LAB — BASIC METABOLIC PANEL
ANION GAP: 10 (ref 5–15)
BUN: 19 mg/dL (ref 6–20)
CHLORIDE: 103 mmol/L (ref 101–111)
CO2: 23 mmol/L (ref 22–32)
Calcium: 9.2 mg/dL (ref 8.9–10.3)
Creatinine, Ser: 0.78 mg/dL (ref 0.61–1.24)
Glucose, Bld: 157 mg/dL — ABNORMAL HIGH (ref 65–99)
POTASSIUM: 3.8 mmol/L (ref 3.5–5.1)
SODIUM: 136 mmol/L (ref 135–145)

## 2016-06-16 LAB — CBC WITH DIFFERENTIAL/PLATELET
BASOS ABS: 0.1 10*3/uL (ref 0.0–0.1)
Basophils Relative: 1 %
EOS ABS: 0.4 10*3/uL (ref 0.0–0.7)
EOS PCT: 5 %
HCT: 40.7 % (ref 39.0–52.0)
HEMOGLOBIN: 14.8 g/dL (ref 13.0–17.0)
LYMPHS ABS: 3.4 10*3/uL (ref 0.7–4.0)
LYMPHS PCT: 40 %
MCH: 30.1 pg (ref 26.0–34.0)
MCHC: 36.4 g/dL — ABNORMAL HIGH (ref 30.0–36.0)
MCV: 82.9 fL (ref 78.0–100.0)
Monocytes Absolute: 0.7 10*3/uL (ref 0.1–1.0)
Monocytes Relative: 8 %
NEUTROS PCT: 46 %
Neutro Abs: 3.8 10*3/uL (ref 1.7–7.7)
PLATELETS: 289 10*3/uL (ref 150–400)
RBC: 4.91 MIL/uL (ref 4.22–5.81)
RDW: 13.8 % (ref 11.5–15.5)
WBC: 8.5 10*3/uL (ref 4.0–10.5)

## 2016-06-16 LAB — TROPONIN I: Troponin I: <0.03 ng/mL (ref <0.03)

## 2016-06-16 MED ORDER — KETOROLAC TROMETHAMINE 30 MG/ML IJ SOLN
30.0000 mg | Freq: Once | INTRAMUSCULAR | Status: AC
  Administered 2016-06-16: 30 mg via INTRAVENOUS
  Filled 2016-06-16: qty 1

## 2016-06-16 MED ORDER — DICLOFENAC SODIUM ER 100 MG PO TB24: 100.0000 mg | ORAL_TABLET | Freq: Every day | ORAL | 0 refills | Status: DC

## 2016-06-16 MED ORDER — GI COCKTAIL ~~LOC~~
30.0000 mL | Freq: Once | ORAL | Status: AC
  Administered 2016-06-16: 30 mL via ORAL
  Filled 2016-06-16: qty 30

## 2016-06-16 MED ORDER — IOPAMIDOL (ISOVUE-370) INJECTION 76%
100.0000 mL | Freq: Once | INTRAVENOUS | Status: AC | PRN
  Administered 2016-06-16: 100 mL via INTRAVENOUS

## 2016-06-16 NOTE — ED Notes (Signed)
Pt returned from xray

## 2016-06-16 NOTE — ED Notes (Signed)
Pt returned from CT °

## 2016-06-16 NOTE — ED Notes (Signed)
Patient transported to X-ray 

## 2016-06-16 NOTE — ED Provider Notes (Signed)
[facility] Provider Note   CSN: [id number] Arrival date & time: 06/16/16  0018  By signing my name below, I, Soijett Blue, attest that this documentation has been prepared under the direction and in the presence of Marquasha Brutus, MD. Electronically Signed: Soijett Blue, ED Scribe. 06/16/16. 12:37 AM.   History   Chief Complaint Chief Complaint  Patient presents with  . Chest Pain    HPI  Carnie Crispino is a 55 y.o. male with a PMHx of asthmatic bronchitis, DM, HTN, who presents to the Emergency Department complaining of sharp, left sided CP onset 9 PM. Pt reports that his left sided CP radiates to his left sided abdomen and left flank area. Pt states that he consumed a meal 2 hours prior to the onset of his left sided CP. Pt notes that his left sided CP is worsened with laying on left side. Pt denies alleviating factors for his left sided CP. Pt denies recent injury, falls, heavy lifting, recent MVC, recent travel, or immobolization. He states that he is having associated symptoms left sided abdominal pain, left flank pain, and wheezing x 2 months. He states that he has not tried any medications for the relief of his symptoms. He denies nausea, vomiting, back pain, diarrhea, constipation, and any other symptoms.   The history is provided by the patient. No language interpreter was used.  Chest Pain   This is a new problem. The current episode started 3 to 5 hours ago. The problem occurs constantly. The problem has not changed since onset.The pain is associated with movement (laying with left side down). The pain is present in the lateral region (left). The pain is mild. The quality of the pain is described as sharp. Radiates to: left sided abdomen and left flank. Duration of episode(s) is 3 hours. The symptoms are aggravated by deep breathing and certain positions. Associated symptoms include abdominal pain (left sided abdominal wall). Pertinent negatives include no back  pain, no fever, no nausea, no near-syncope, no numbness and no vomiting. He has tried nothing for the symptoms. The treatment provided no relief. Risk factors include male gender.  His past medical history is significant for diabetes and hypertension.  Pertinent negatives for past medical history include no COPD and no recent injury.  Pertinent negatives for family medical history include: no aortic dissection.  Procedure history is positive for echocardiogram.    Past Medical History:  Diagnosis Date  . 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  . Adenomatous colon polyp 05/07/06; 05/2012   Repeat colonoscopy 05/17/2012 showed 2 small polyps that were removed--path showed Hyperplastic-not adenomatous.  Still needs repeat 5 yrs.  . Angioedema of lips    saw allergist 11/2011  . Asthmatic bronchitis , chronic ([place]) 01/11/2012   Question occupational asthma with no specific agents identified. He does notice that some areas of his job are particularly dusty and dirty. Currently he is well controlled using Symbicort through an [employer]ero[employer] and rarely needing his rescue inhaler. This would be an acceptable long-term status. Elevated nonspecific total IgE and peripheral eosinophilia to suggest an atopic trigger with sensitization to something Consider Daliresp for future use if needed   . Chronic recurrent sinusitis 02/28/2012  . Depression    Prozac in the past not much help.  Spontaneously resolved.  . Diabetes mellitus without complication ([place])   . Diverticulosis   . Generalized anxiety disorder 08/11/2012  .  Hemorrhoids    ext and int  . Hypertension 2017  . Hypertriglyceridemia    fish oil OTC  . Hypogonadism, male 04/08/2011   Felt improved on androgel, but testost levels did no improve.  Several time has been <100 (saw [facility] in 2011 for this, w/u for secondary hypogonadism was unremarkable), most recent  testosterone check via [facility] 10/2010 was 1.15 ng/ml.   . Insomnia   . Internal hemorrhoids, bleeding and sicharge 05/29/2013  . OSA on CPAP   . Rotator cuff tear 2011   [facility]: conservative mgmt--symptoms stable as of 04/2011.    Patient Active Problem List   Diagnosis Date Noted  . OSA (obstructive sleep apnea) 04/27/2016  . Essential hypertension 04/14/2016  . Orthostatic hypotension 03/11/2016  . Exercise intolerance 03/11/2016  . Diabetes mellitus without complication ([place]) 123XX123  . Anal fissure - posterior 11/28/2013  . Rectal bleeding 05/29/2013  . Internal hemorrhoids, bleeding and discharge 05/29/2013  . Angioedema of lips 03/13/2013  . Asthma with bronchitis 03/13/2013  . External hemorrhoids 09/28/2012  . Acrophobia   . Prostate cancer screening 04/24/2011  . Hypogonadism, male 04/08/2011    Past Surgical History:  Procedure Laterality Date  . adnoids    . CARDIOVASCULAR STRESS TEST     Normal stress nuclear study, EF normal. +Hypertensive bp response--subsequent 24H ambulatory BP monitoring confirmed HTN.  . COLONOSCOPY  05/2006, 05/2012  . HEMORRHOID BANDING    . POLYPECTOMY     Adenomatous 2007; hyperplastic 2013.  Repeat 5 yrs.  . TRANSTHORACIC ECHOCARDIOGRAM  01/30/16   Normal except mild aortic dilatation  . WISDOM TOOTH EXTRACTION         Home Medications    Prior to Admission medications   Medication Sig Start Date End Date Taking? Authorizing Provider  albuterol (VENTOLIN HFA) 108 (90 Base) MCG/ACT inhaler Inhale 1-2 puffs into the lungs every 4 (four) hours as needed for wheezing. 09/24/15 09/23/16  Tammi Sou, MD  amLODipine (NORVASC) 10 MG tablet Take 1 tablet (10 mg total) by mouth daily. 05/21/16 08/19/16  Sueanne Margarita, MD  CIALIS 20 MG tablet Take 1 tablet (20 mg total) by mouth daily as needed. Reported on 03/11/2016 06/12/16   Tammi Sou, MD  EPINEPHrine (EPIPEN) 0.3 mg/0.3 mL DEVI Inject 0.3 mLs (0.3 mg total) into the  muscle once. 12/07/11   Tammi Sou, MD  glucose blood test strip Check glucose bid 06/10/15   Tammi Sou, MD  metFORMIN (GLUCOPHAGE) 1000 MG tablet Take 1 tablet (1,000 mg total) by mouth 2 (two) times daily with a meal. 02/14/16   Tammi Sou, MD  Multiple Vitamins-Minerals (MULTIVITAMIN WITH MINERALS) tablet Take 1 tablet by mouth daily.    Historical Provider, MD  Testosterone (FORTESTA) 10 MG/ACT (2%) GEL Place 40 mg onto the skin daily. (2 pumps to each thigh) 02/14/16   Tammi Sou, MD  TURMERIC PO Take 1 capsule by mouth every other day.    Historical Provider, MD    Family History Family History  Problem Relation Age of Onset  . Hypertension Mother   . Diabetes Mother   . CVA Mother   . Hypertension Father   . Diabetes Father   . Dementia Father   . Heart disease Father   . CAD Father     Social History Social History  Substance Use Topics  . Smoking status: Never Smoker  . Smokeless tobacco: Never Used  . Alcohol use 0.0 oz/week  Comment: rarely     Allergies   Molds & smuts   Review of Systems Review of Systems  Constitutional: Negative for chills and fever.  Respiratory: Positive for wheezing.   Cardiovascular: Positive for chest pain (left sided). Negative for near-syncope.  Gastrointestinal: Positive for abdominal pain (left sided abdominal wall). Negative for constipation, diarrhea, nausea and vomiting.  Genitourinary: Negative for dysuria and flank pain.  Musculoskeletal: Negative for back pain.  Neurological: Negative for numbness.  All other systems reviewed and are negative.    Physical Exam Updated Vital Signs BP 148/84 (BP Location: Right Arm)   Pulse 72   Temp 98.2 F (36.8 C) (Oral)   Resp 18   Ht 5\' 10"  (1.778 m)   Wt 210 lb (95.3 kg)   SpO2 98%   BMI 30.13 kg/m   Physical Exam  Constitutional: He is oriented to person, place, and time. He appears well-developed and well-nourished. No distress.  HENT:  Head:  Normocephalic and atraumatic.  Mouth/Throat: Oropharynx is clear and moist. No oropharyngeal exudate.  Eyes: Conjunctivae and EOM are normal. Pupils are equal, round, and reactive to light.  Neck: Trachea normal and normal range of motion. Neck supple. No JVD present. Carotid bruit is not present. No tracheal deviation present.  Trachea midline. No stridor. No bruits.   Cardiovascular: Normal rate, regular rhythm, normal heart sounds and intact distal pulses.  Exam reveals no gallop and no friction rub.   No murmur heard. Pulmonary/Chest: Effort normal and breath sounds normal. No stridor. No respiratory distress. He has no wheezes. He has no rales. He exhibits no tenderness.  Abdominal: Soft. He exhibits distension. He exhibits no mass. Bowel sounds are increased. There is no tenderness. There is no rebound and no guarding.  Hyperactive bowel sounds heard throughout abdomen and the thoracic cavity. Abdomen distension.  Musculoskeletal: Normal range of motion. He exhibits no edema, tenderness or deformity.  Soft compartments. No palpable cords. No BLE edema.  Neurological: He is alert and oriented to person, place, and time. He has normal reflexes.  DTR's intact.   Skin: Skin is warm and dry. Capillary refill takes less than 2 seconds. He is not diaphoretic.  Psychiatric: He has a normal mood and affect. His behavior is normal.  Nursing note and vitals reviewed.    ED Treatments / Results   Vitals:   06/16/16 0025 06/16/16 0100  BP: 148/84 135/83  Pulse: 72 73  Resp: 18 17  Temp: 98.2 F (36.8 C)     Results for orders placed or performed during the hospital encounter of 06/16/16  CBC with Differential/Platelet  Result Value Ref Range   WBC 8.5 4.0 - 10.5 K/uL   RBC 4.91 4.22 - 5.81 MIL/uL   Hemoglobin 14.8 13.0 - 17.0 g/dL   HCT 40.7 39.0 - 52.0 %   MCV 82.9 78.0 - 100.0 fL   MCH 30.1 26.0 - 34.0 pg   MCHC 36.4 (H) 30.0 - 36.0 g/dL   RDW 13.8 11.5 - 15.5 %   Platelets 289  150 - 400 K/uL   Neutrophils Relative % 46 %   Neutro Abs 3.8 1.7 - 7.7 K/uL   Lymphocytes Relative 40 %   Lymphs Abs 3.4 0.7 - 4.0 K/uL   Monocytes Relative 8 %   Monocytes Absolute 0.7 0.1 - 1.0 K/uL   Eosinophils Relative 5 %   Eosinophils Absolute 0.4 0.0 - 0.7 K/uL   Basophils Relative 1 %   Basophils Absolute 0.1 0.0 -  0.1 K/uL  Basic metabolic panel  Result Value Ref Range   Sodium 136 135 - 145 mmol/L   Potassium 3.8 3.5 - 5.1 mmol/L   Chloride 103 101 - 111 mmol/L   CO2 23 22 - 32 mmol/L   Glucose, Bld 157 (H) 65 - 99 mg/dL   BUN 19 6 - 20 mg/dL   Creatinine, Ser 0.78 0.61 - 1.24 mg/dL   Calcium 9.2 8.9 - 10.3 mg/dL   GFR calc non Af Amer >60 >60 mL/min   GFR calc Af Amer >60 >60 mL/min   Anion gap 10 5 - 15  Troponin I  Result Value Ref Range   Troponin I <0.03 <0.03 ng/mL  Troponin I  Result Value Ref Range   Troponin I <0.03 <0.03 ng/mL   Dg Chest 2 View  Result Date: 06/16/2016 CLINICAL DATA:  Dyspnea EXAM: CHEST  2 VIEW COMPARISON:  03/13/2013 FINDINGS: The heart size and mediastinal contours are within normal limits. Lung volumes are slightly low. There is a small focus of atelectasis and/or minimal infiltrate at the left lung base. The visualized skeletal structures are unremarkable. IMPRESSION: Minimal atelectasis and/or infiltrate at the left lung base best seen on the frontal view. Electronically Signed   By: Ashley Royalty M.D.   On: 06/16/2016 01:33   Ct Angio Chest Pe W And/or Wo Contrast  Result Date: 06/16/2016 CLINICAL DATA:  56 year old male with chest pain. EXAM: CT ANGIOGRAPHY CHEST WITH CONTRAST TECHNIQUE: Multidetector CT imaging of the chest was performed using the standard protocol during bolus administration of intravenous contrast. Multiplanar CT image reconstructions and MIPs were obtained to evaluate the vascular anatomy. CONTRAST:  100 cc Isovue 370 COMPARISON:  Chest radiograph dated 06/16/2016 FINDINGS: Cardiovascular: There is mild  atherosclerotic calcification of the thoracic aorta. No aneurysmal dilatation or evidence of dissection. No CT evidence of pulmonary embolism. No cardiomegaly or pericardial effusion. There is coronary vascular calcification. Mediastinum/Nodes: Top-normal right hilar lymph nodes. There is no mediastinal adenopathy. The esophagus is grossly unremarkable. Lungs/Pleura: The lungs are clear. There is no pleural effusion or pneumothorax. The central airways are patent. Upper Abdomen: Diffuse fatty infiltration of the liver. The visualized upper abdomen is otherwise unremarkable. Musculoskeletal: No chest wall abnormality. No acute or significant osseous findings. Review of the MIP images confirms the above findings. IMPRESSION: No acute intrathoracic pathology. No CT evidence of pulmonary embolism. Electronically Signed   By: Anner Crete M.D.   On: 06/16/2016 02:56     EKG Interpretation  Date/Time:  Tuesday June 16 2016 00:24:57 EDT Ventricular Rate:  71 PR Interval:    QRS Duration: 96 QT Interval:  378 QTC Calculation: 411 R Axis:   71 Text Interpretation:  Sinus arrhythmia Confirmed by [facility]  MD, Charnese Federici ([id number]) on 06/16/2016 12:33:53 AM       Procedures Procedures (including critical care time)  Medications Ordered in ED Medications  gi cocktail (Maalox,Lidocaine,Donnatal) (30 mLs Oral Given 06/16/16 0036)  ketorolac (TORADOL) 30 MG/ML injection 30 mg (30 mg Intravenous Given 06/16/16 0046)     Final Clinical Impressions(s) / ED Diagnoses   Rules out for MI with negaitve EKG and 2 negative troponins in the ED.  Ruled out for PE, dissection and PNA with negative angio of the chest.  Symptoms were highly atypical for cardiac chest pain and are most consistent with Chest/abdominal wall pain as they are positional and worse with deep breathing.  All questions answered to patient's parents satisfaction. Based on history and exam  patient has been appropriately medically screened  and emergency conditions excluded. Patient is stable for discharge at this time. Follow up with your PMD for recheck in 2 days and strict return precautions given. I personally performed the services described in this documentation, which was scribed in my presence. The recorded information has been reviewed and is accurate.       Veatrice Kells, MD 06/16/16 [phone]

## 2016-06-16 NOTE — ED Notes (Signed)
Patient transported to CT 

## 2016-06-16 NOTE — ED Notes (Signed)
Pt verbalizes understanding of d/c instructions and denies any further needs at this time. 

## 2016-06-16 NOTE — ED Triage Notes (Signed)
Pt c/o left side chest pain that started tonight around 2100.  The pain is worse when he lies down and worse when he takes a deep breath.  Pt denies any other symptoms associated with the pain.

## 2016-06-18 ENCOUNTER — Encounter: Payer: Self-pay | Admitting: Family Medicine

## 2016-06-18 DIAGNOSIS — E291 Testicular hypofunction: Secondary | ICD-10-CM

## 2016-06-18 NOTE — Telephone Encounter (Signed)
OK. Referral to Dr. Elyse Hsu has been ordered as per pt request.

## 2016-06-23 NOTE — Telephone Encounter (Signed)
Dr. Altheimer's office called to inquire if patient has seen an endocrinologist in the past for this issue.  They will needs office visit notes prior to patient being scheduled.  I left a message with patient to inquire if he has seen any other endocrinologist, awaiting call back from patient.

## 2016-06-24 ENCOUNTER — Encounter: Payer: Self-pay | Admitting: Cardiology

## 2016-07-30 ENCOUNTER — Telehealth: Payer: Self-pay | Admitting: Family Medicine

## 2016-07-30 NOTE — Telephone Encounter (Signed)
Noted  

## 2016-07-30 NOTE — Telephone Encounter (Signed)
FYI

## 2016-07-30 NOTE — Telephone Encounter (Signed)
Patient called & asked for an appointment for asthma. Offered one for tomorrow patient declined & said he was going to urgent care.

## 2016-08-05 ENCOUNTER — Ambulatory Visit (INDEPENDENT_AMBULATORY_CARE_PROVIDER_SITE_OTHER): Payer: Commercial Managed Care - HMO | Admitting: Endocrinology

## 2016-08-05 ENCOUNTER — Encounter: Payer: Self-pay | Admitting: Endocrinology

## 2016-08-05 VITALS — BP 130/78 | HR 81 | Ht 70.0 in | Wt 214.0 lb

## 2016-08-05 DIAGNOSIS — E291 Testicular hypofunction: Secondary | ICD-10-CM | POA: Diagnosis not present

## 2016-08-05 DIAGNOSIS — R5383 Other fatigue: Secondary | ICD-10-CM

## 2016-08-05 DIAGNOSIS — E1165 Type 2 diabetes mellitus with hyperglycemia: Secondary | ICD-10-CM | POA: Diagnosis not present

## 2016-08-05 NOTE — Patient Instructions (Signed)
No OTC T pills for 2 weeks

## 2016-08-05 NOTE — Progress Notes (Signed)
Patient ID: Douglas [name], male   DOB: 08/04/1961, 55 y.o.   MRN: [id number]           Chief complaint: Fatigue  Referring physician: Ernestine Conrad   Reason for consultation: Low testosterone level  History of Present Illness  Hypogonadismwas diagnosed in 2009  He was apparently initially seen by his psychiatrist and was having difficulties with fatigue, agoraphobia and decreased libido as well as sexual dysfunction. He was told that his testosterone level was significantly low and was given testosterone supplements probably with AndroGel  Subsequently the patient has been treated by various physicians including the urologist with testosterone supplements, mostly AndroGel He thinks that his testosterone levels have never been normal with AndroGel He was also tried on monthly injections for about 3 months without consistent benefit also  In 2016 he was given [place] but he has not taken any for the last 5 months or so  He has had recent complaints offatigue, decreased motivation, decreased libido and continued erectile dysfunction. Also at times will get night sweats on his head and neck.  He has not had any history of testicular or cranial injury or mumps in childhood. The symptoms started The patient has tried OTC anabolic testosterone supplements off and on over the last few years but usually small amounts and intermittently. Tried some of these supplements for about 2 weeks recently with only marginal benefit  No history of osteopenia or low impact fracture  Prior lab results showtestosterone levels of:  Lab Results  Component Value Date   TESTOSTERONE 228.54 (L) 06/03/2015   TESTOSTERONE 120 (L) 03/08/2014   TESTOSTERONE 224 (L) 12/08/2012    Prolactin level:Not tested  No results found for: [facility]         Medication List       Accurate as of 08/05/16 11:27 AM. Always use your most recent med list.          albuterol 108 (90 Base) MCG/ACT  inhaler Commonly known as:  VENTOLIN HFA Inhale 1-2 puffs into the lungs every 4 (four) hours as needed for wheezing.   amLODipine 10 MG tablet Commonly known as:  NORVASC Take 1 tablet (10 mg total) by mouth daily.   CIALIS 20 MG tablet Generic drug:  tadalafil Take 1 tablet (20 mg total) by mouth daily as needed. Reported on 03/11/2016   Diclofenac Sodium CR 100 MG 24 hr tablet Commonly known as:  VOLTAREN-XR Take 1 tablet (100 mg total) by mouth daily.   EPINEPHrine 0.3 mg/0.3 mL Devi Commonly known as:  EPIPEN Inject 0.3 mLs (0.3 mg total) into the muscle once.   glucose blood test strip Check glucose bid   metFORMIN 1000 MG tablet Commonly known as:  GLUCOPHAGE Take 1 tablet (1,000 mg total) by mouth 2 (two) times daily with a meal.   multivitamin with minerals tablet Take 1 tablet by mouth daily.   Testosterone 10 MG/ACT (2%) Gel Commonly known as:  FORTESTA Place 40 mg onto the skin daily. (2 pumps to each thigh)   TURMERIC PO Take 1 capsule by mouth every other day.       Allergies:  Allergies  Allergen Reactions  . Molds & Smuts Other (See Comments)    Past Medical History:  Diagnosis Date  . 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  . Adenomatous colon polyp 05/07/06; 05/2012   Repeat colonoscopy 05/17/2012 showed 2 small polyps that were  removed--path showed Hyperplastic-not adenomatous.  Still needs repeat 5 yrs.  . Angioedema of lips    saw allergist 11/2011  . Asthmatic bronchitis , chronic ([place]) 01/11/2012   Question occupational asthma with no specific agents identified. He does notice that some areas of his job are particularly dusty and dirty. Currently he is well controlled using Symbicort through an AeroChamber and rarely needing his rescue inhaler. This would be an acceptable long-term status. Elevated nonspecific total IgE and peripheral eosinophilia to suggest an atopic  trigger with sensitization to something Consider Daliresp for future use if needed   . Chronic recurrent sinusitis 02/28/2012  . Depression    Prozac in the past not much help.  Spontaneously resolved.  . Diabetes mellitus without complication ([place])   . Diverticulosis   . Generalized anxiety disorder 08/11/2012  . Hemorrhoids    ext and int  . Hypertension 2017  . Hypertriglyceridemia    fish oil OTC  . Hypogonadism, male 04/08/2011   Felt improved on androgel, but testost levels did no improve.  Several time has been <100 (saw [facility] in 2011 for this, w/u for secondary hypogonadism was unremarkable), most recent testosterone check via [facility] 10/2010 was 1.15 ng/ml.   . Insomnia   . Internal hemorrhoids, bleeding and sicharge 05/29/2013  . OSA on CPAP   . Rotator cuff tear 2011   [facility]: conservative mgmt--symptoms stable as of 04/2011.    Past Surgical History:  Procedure Laterality Date  . adnoids    . CARDIOVASCULAR STRESS TEST     Normal stress nuclear study, EF normal. +Hypertensive bp response--subsequent 24H ambulatory BP monitoring confirmed HTN.  . COLONOSCOPY  05/2006, 05/2012  . HEMORRHOID BANDING    . POLYPECTOMY     Adenomatous 2007; hyperplastic 2013.  Repeat 5 yrs.  . TRANSTHORACIC ECHOCARDIOGRAM  01/30/16   Normal except mild aortic dilatation  . WISDOM TOOTH EXTRACTION      Family History  Problem Relation Age of Onset  . Hypertension Mother   . Diabetes Mother   . CVA Mother   . Hypertension Father   . Diabetes Father   . Dementia Father   . Heart disease Father   . CAD Father     Social History:  reports that he has never smoked. He has never used smokeless tobacco. He reports that he drinks alcohol. He reports that he does not use drugs.  Review of Systems  Constitutional: Negative for weight loss.  HENT: Negative for headaches.   Eyes: Negative for visual disturbance.  Respiratory: Positive for shortness of breath.    Cardiovascular: Negative for chest pain and leg swelling.  Endocrine: Positive for abnormal weight gain, decreased libido and erectile dysfunction. Negative for cold intolerance.  Genitourinary: Negative for slow stream.  Musculoskeletal: Positive for joint pain.       Recently having foot pain, improved  Neurological: Positive for weakness.       He thinks he has some weakness in his muscles.  Previously better with exercise using personal trainer   DIABETES: He had significant hyperglycemia at onset but subsequently well controlled on metformin. He thinks his blood sugars are in the 120-150 range usually unless he goes off his diet Followed by PCP  Wt Readings from Last 3 Encounters:  08/05/16 214 lb (97.1 kg)  06/16/16 210 lb (95.3 kg)  04/27/16 210 lb 9.6 oz (95.5 kg)     Lab Results  Component Value Date   HGBA1C 6.5 12/26/2015  HGBA1C 6.4 09/25/2015   HGBA1C 12.1 (H) 06/03/2015   Lab Results  Component Value Date   MICROALBUR <0.7 09/25/2015   CREATININE 0.78 06/16/2016     General Examination:   BP 130/78   Pulse 81   Ht 5\' 10"  (1.778 m)   Wt 214 lb (97.1 kg)   SpO2 96%   BMI 30.71 kg/m   GENERAL APPEARANCE Has mild generalized obesity.  SKIN:normal, no rash or pigmentation.  HEENT:Oral mucosa normal. Low-lying oropharyngeal opening.  EYES:normal external appearance of eyes, Fundi benign.   NECK:no lymphadenopathy, no thyromegaly.  CHEST: Gynecomastia absent LUNGS:clear to auscultation bilaterally, no wheezes, rhonchi, rales.   HEART:normal S1 And S2, no S3, S4, murmur or click.  ABDOMEN:no hepatosplenomegaly, no masses palpated, soft and not tender.   MALE GENITOURINARY:Testicles are normal bilaterally  MUSCULOSKELETALNo enlargement or deformity of joints.  EXTREMITIES:no clubbing, no edema.  NEUROLOGIC EXAM: Biceps reflexes normal (2+) bilaterally.   Assessment/ Plan:  ?   Hypogonadotropic hypogonadism  Patient appears to have had persistently low testosterone level However this has not been evaluated Has been apparently treated with testosterone supplements off and on with only some improvement subjectively Currently not on any treatment although he has been using OTC testosterone supplements on his own more recently  Because of his diabetes he likely has insulin resistance syndrome causing pituitary dysfunction However prolactin level is to be evaluated and he will need to be getting a fasting free testosterone level along with LH levels for further delineation of his problem We will wait 2 weeks to get the labs since he needs to stop his OTC testosterone supplements for now  Discussed use of clomiphene as a potential option since he has not had much improvement in symptoms are testosterone levels with testosterone supplements including injections Discussed how this would work and will decide on dosage based on his baseline labs  DIABETES on metformin: A1c will need to be done, has not had any since April and currently only on metformin monotherapy Encouraged him to start an exercise regimen also He is reporting fair blood sugars at [name] but most likely does need more confidence of diabetes education, will be deferred to PCP  Consultation note sent to PCP   [facility] 08/05/2016, 11:27 AM

## 2016-08-06 ENCOUNTER — Encounter: Payer: Self-pay | Admitting: Family Medicine

## 2016-08-19 ENCOUNTER — Ambulatory Visit: Payer: Commercial Managed Care - HMO | Admitting: Family Medicine

## 2016-08-20 ENCOUNTER — Ambulatory Visit (INDEPENDENT_AMBULATORY_CARE_PROVIDER_SITE_OTHER): Payer: Commercial Managed Care - HMO | Admitting: Family Medicine

## 2016-08-20 ENCOUNTER — Encounter: Payer: Self-pay | Admitting: Family Medicine

## 2016-08-20 VITALS — BP 123/86 | HR 94 | Temp 98.8°F | Resp 16 | Ht 70.0 in | Wt 206.5 lb

## 2016-08-20 DIAGNOSIS — J4541 Moderate persistent asthma with (acute) exacerbation: Secondary | ICD-10-CM | POA: Diagnosis not present

## 2016-08-20 DIAGNOSIS — J302 Other seasonal allergic rhinitis: Secondary | ICD-10-CM | POA: Diagnosis not present

## 2016-08-20 MED ORDER — PREDNISONE 20 MG PO TABS: ORAL_TABLET | ORAL | 0 refills | Status: DC

## 2016-08-20 MED ORDER — BUDESONIDE-FORMOTEROL FUMARATE 160-4.5 MCG/ACT IN AERO: 2.0000 | INHALATION_SPRAY | Freq: Two times a day (BID) | RESPIRATORY_TRACT | 12 refills | Status: DC

## 2016-08-20 NOTE — Progress Notes (Signed)
OFFICE VISIT  08/20/2016   CC:  Chief Complaint  Patient presents with  . URI    x 2-3 months   HPI:    Patient is a 55 y.o. Caucasian male who presents for respiratory complaints. He has chronic PND from allergies.  When he got HTN he got off his allegra D.   Essentially he still has chronic nasal congestion and PND which he is afraid is going to turn into an asthma flare.  Lately symptoms seem to be gradually worsening.  He does have cough that is dry.  Says he is wheezing "all the time".  Mucinex recently helped.  No fever.  Chest tightness but no paiin. No pain in face or upper teeth.  Used albut rescue a lot last week, but not much need for it this week.  Says most recent sinusitis requiring abx was approx mid Nov: got abx and prednisone. This seemed to "knock it out".  Then sx's began again at some point Patient is very hard to follow during history-taking.   Past Medical History:  Diagnosis Date  . 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  . Adenomatous colon polyp 05/07/06; 05/2012   Repeat colonoscopy 05/17/2012 showed 2 small polyps that were removed--path showed Hyperplastic-not adenomatous.  Still needs repeat 5 yrs.  . Angioedema of lips    saw allergist 11/2011  . Asthmatic bronchitis , chronic ([place]) 01/11/2012   Question occupational asthma with no specific agents identified. He does notice that some areas of his job are particularly dusty and dirty. Currently he is well controlled using Symbicort through an AeroChamber and rarely needing his rescue inhaler. This would be an acceptable long-term status. Elevated nonspecific total IgE and peripheral eosinophilia to suggest an atopic trigger with sensitization to something Consider Daliresp for future use if needed   . Chronic recurrent sinusitis 02/28/2012  . Depression    Prozac in the past not much help.  Spontaneously resolved.  . Diabetes mellitus  without complication ([place])   . Diverticulosis   . Generalized anxiety disorder 08/11/2012  . Hemorrhoids    ext and int  . Hypertension 2017  . Hypertriglyceridemia    fish oil OTC  . Hypogonadism, male 04/08/2011   Felt improved on androgel, but testost levels did no improve.  Several time has been <100 (saw [facility] in 2011 for this, w/u for secondary hypogonadism was unremarkable), most recent testosterone check via [facility] 10/2010 was 1.15 ng/ml.   As of 08/06/16, Dr. Dwyane Dee ([facility]) is managing this problem.  . Insomnia   . Internal hemorrhoids, bleeding and sicharge 05/29/2013  . OSA on CPAP   . Rotator cuff tear 2011   [facility]: conservative mgmt--symptoms stable as of 04/2011.    Past Surgical History:  Procedure Laterality Date  . adnoids    . CARDIOVASCULAR STRESS TEST     Normal stress nuclear study, EF normal. +Hypertensive bp response--subsequent 24H ambulatory BP monitoring confirmed HTN.  . COLONOSCOPY  05/2006, 05/2012  . HEMORRHOID BANDING    . POLYPECTOMY     Adenomatous 2007; hyperplastic 2013.  Repeat 5 yrs.  . TRANSTHORACIC ECHOCARDIOGRAM  01/30/16   Normal except mild aortic dilatation  . WISDOM TOOTH EXTRACTION     MEDS: not taking testosterone listed below Outpatient Medications Prior to Visit  Medication Sig Dispense Refill  . albuterol (VENTOLIN HFA) 108 (90 Base) MCG/ACT inhaler Inhale 1-2 puffs  into the lungs every 4 (four) hours as needed for wheezing. 1 Inhaler 1  . CIALIS 20 MG tablet Take 1 tablet (20 mg total) by mouth daily as needed. Reported on 03/11/2016 10 tablet 12  . EPINEPHrine (EPIPEN) 0.3 mg/0.3 mL DEVI Inject 0.3 mLs (0.3 mg total) into the muscle once. 1 Device 3  . glucose blood test strip Check glucose bid 100 each 12  . metFORMIN (GLUCOPHAGE) 1000 MG tablet Take 1 tablet (1,000 mg total) by mouth 2 (two) times daily with a meal. 180 tablet 1  . Multiple Vitamins-Minerals (MULTIVITAMIN WITH MINERALS) tablet Take 1 tablet by mouth  daily.    . TURMERIC PO Take 1 capsule by mouth every other day.    Marland Kitchen amLODipine (NORVASC) 10 MG tablet Take 1 tablet (10 mg total) by mouth daily. 90 tablet 3  . Diclofenac Sodium CR (VOLTAREN-XR) 100 MG 24 hr tablet Take 1 tablet (100 mg total) by mouth daily. (Patient not taking: Reported on 08/20/2016) 10 tablet 0  . Testosterone (FORTESTA) 10 MG/ACT (2%) GEL Place 40 mg onto the skin daily. (2 pumps to each thigh) (Patient not taking: Reported on 08/20/2016) 60 g 5   No facility-administered medications prior to visit.     Allergies  Allergen Reactions  . Molds & Smuts Other (See Comments)    ROS As per HPI  PE: Blood pressure 123/86, pulse 94, temperature 98.8 F (37.1 C), temperature source Oral, resp. rate 16, height 5\' 10"  (1.778 m), weight 206 lb 8 oz (93.7 kg), SpO2 96 %. VS: noted--normal. Gen: alert, NAD, NONTOXIC APPEARING. HEENT: eyes without injection, drainage, or swelling.  Ears: EACs clear, TMs with normal light reflex and landmarks.  Nose: Clear rhinorrhea, with some dried, crusty exudate adherent to mildly injected mucosa.  No purulent d/c.  No paranasal sinus TTP.  No facial swelling.  Throat and mouth without focal lesion.  No pharyngial swelling, erythema, or exudate.   Neck: supple, no LAD.   LUNGS: CTA bilat, nonlabored resps.  Occ trace insp/exp rhonchi heard.  Aeration is good. CV: RRR, no m/r/g. EXT: no c/c/e SKIN: no rash  LABS:  none  IMPRESSION AND PLAN:  1) Allergic rhinitis, chronic.  Pt hasn't tolerated nasal steroid sprays in the past. Mucinex D has been helping. Zyrtec and allegra x 2 weeks each recently no help. I see no sign of bacterial sinusitis at this time.  2) Moderate persistent asthma, not compliant with daily inhaler, control not good but he is not exactly in the midst of an exacerbation. Prednisone 40mg  qd x 5d. REstart symbicort 160/4.5, 2 p bid.  An After Visit Summary was printed and given to the patient.  FOLLOW UP:  Return in about 3 months (around 11/18/2016) for routine chronic illness f/u.  Signed:  Crissie Sickles, MD           08/20/2016

## 2016-08-20 NOTE — Progress Notes (Signed)
Pre visit review using our clinic review tool, if applicable. No additional management support is needed unless otherwise documented below in the visit note. 

## 2016-09-03 ENCOUNTER — Other Ambulatory Visit (INDEPENDENT_AMBULATORY_CARE_PROVIDER_SITE_OTHER): Payer: Commercial Managed Care - HMO

## 2016-09-03 DIAGNOSIS — E1165 Type 2 diabetes mellitus with hyperglycemia: Secondary | ICD-10-CM

## 2016-09-03 DIAGNOSIS — E291 Testicular hypofunction: Secondary | ICD-10-CM | POA: Diagnosis not present

## 2016-09-03 DIAGNOSIS — R5383 Other fatigue: Secondary | ICD-10-CM | POA: Diagnosis not present

## 2016-09-03 LAB — COMPREHENSIVE METABOLIC PANEL
ALBUMIN: 4.5 g/dL (ref 3.5–5.2)
ALT: 31 U/L (ref 0–53)
AST: 16 U/L (ref 0–37)
Alkaline Phosphatase: 58 U/L (ref 39–117)
BILIRUBIN TOTAL: 0.4 mg/dL (ref 0.2–1.2)
BUN: 15 mg/dL (ref 6–23)
CALCIUM: 9.7 mg/dL (ref 8.4–10.5)
CO2: 28 mEq/L (ref 19–32)
CREATININE: 0.8 mg/dL (ref 0.40–1.50)
Chloride: 102 mEq/L (ref 96–112)
GFR: 105.75 mL/min (ref >60.00)
Glucose, Bld: 202 mg/dL — ABNORMAL HIGH (ref 70–99)
Potassium: 4.2 mEq/L (ref 3.5–5.1)
Sodium: 137 mEq/L (ref 135–145)
TOTAL PROTEIN: 7.3 g/dL (ref 6.0–8.3)

## 2016-09-03 LAB — LUTEINIZING HORMONE: LH: 2.89 m[IU]/mL (ref 1.50–9.30)

## 2016-09-03 LAB — HEMOGLOBIN A1C: Hgb A1c MFr Bld: 7.9 % — ABNORMAL HIGH (ref 4.6–6.5)

## 2016-09-03 LAB — T4, FREE: FREE T4: 0.79 ng/dL (ref 0.60–1.60)

## 2016-09-04 LAB — PROLACTIN: Prolactin: 6.2 ng/mL (ref 4.0–15.2)

## 2016-09-04 LAB — TESTOSTERONE, FREE, TOTAL, SHBG
SEX HORMONE BINDING: 13.7 nmol/L — AB (ref 19.3–76.4)
TESTOSTERONE FREE: 4.5 pg/mL — AB (ref 7.2–24.0)
TESTOSTERONE: 133 ng/dL — AB (ref 264–916)

## 2016-09-14 ENCOUNTER — Other Ambulatory Visit: Payer: Self-pay | Admitting: *Deleted

## 2016-09-14 DIAGNOSIS — G4733 Obstructive sleep apnea (adult) (pediatric): Secondary | ICD-10-CM | POA: Diagnosis not present

## 2016-09-14 MED ORDER — METFORMIN HCL 1000 MG PO TABS: 1000.0000 mg | ORAL_TABLET | Freq: Two times a day (BID) | ORAL | 0 refills | Status: DC

## 2016-09-14 NOTE — Telephone Encounter (Signed)
Fax from [facility].  RF request for metformin LOV: 12/26/15 Next ov: None Last written: 02/14/16 #180 w/ 1RF  Rx sent for #180 w/ 0RF. Pt is due for f/u [facility], needs office visit for more refills.

## 2016-09-14 NOTE — Telephone Encounter (Signed)
Left message for pt to call back  °

## 2016-09-15 DIAGNOSIS — M71572 Other bursitis, not elsewhere classified, left ankle and foot: Secondary | ICD-10-CM | POA: Diagnosis not present

## 2016-09-15 DIAGNOSIS — M722 Plantar fascial fibromatosis: Secondary | ICD-10-CM | POA: Diagnosis not present

## 2016-09-17 ENCOUNTER — Ambulatory Visit: Payer: Self-pay | Admitting: Family Medicine

## 2016-09-18 ENCOUNTER — Other Ambulatory Visit: Payer: Self-pay

## 2016-09-18 ENCOUNTER — Other Ambulatory Visit: Payer: Self-pay | Admitting: *Deleted

## 2016-09-18 MED ORDER — CLOMIPHENE CITRATE 50 MG PO TABS: 50.0000 mg | ORAL_TABLET | Freq: Every day | ORAL | 2 refills | Status: DC

## 2016-09-18 MED ORDER — GLUCOSE BLOOD VI STRP: ORAL_STRIP | 12 refills | Status: DC

## 2016-09-18 NOTE — Telephone Encounter (Signed)
Fax for Douglas [name].  Requesting Rx for test strips.

## 2016-09-21 ENCOUNTER — Encounter: Payer: Self-pay | Admitting: Family Medicine

## 2016-09-21 ENCOUNTER — Ambulatory Visit (INDEPENDENT_AMBULATORY_CARE_PROVIDER_SITE_OTHER): Payer: Commercial Managed Care - HMO | Admitting: Family Medicine

## 2016-09-21 VITALS — BP 133/80 | HR 84 | Temp 97.9°F | Resp 16 | Wt 206.0 lb

## 2016-09-21 DIAGNOSIS — E119 Type 2 diabetes mellitus without complications: Secondary | ICD-10-CM | POA: Diagnosis not present

## 2016-09-21 MED ORDER — METFORMIN HCL 1000 MG PO TABS
1000.0000 mg | ORAL_TABLET | Freq: Two times a day (BID) | ORAL | 3 refills | Status: DC
Start: 2016-09-21 — End: 2017-12-11

## 2016-09-21 NOTE — Progress Notes (Signed)
OFFICE VISIT  09/21/2016   CC:  Chief Complaint  Patient presents with  . Diabetes    follow up   HPI:    Patient is a 56 y.o. Caucasian male who presents for f/u DM 2.  Noncompliant with home glucose monitoring.  Started checking again in the last week: range 111-180 (checks fasting and 2 H PP). This morning was 154. Sounds like some noncompliance with diet until recently when he saw his A1c was up to 7.9% (this was checked at his endo MD's, who manages his hypogonadism but not his DM). Ran out of metformin last week.  FEET: no tingling, burning, or numbness.   Past Medical History:  Diagnosis Date  . 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  . Adenomatous colon polyp 05/07/06; 05/2012   Repeat colonoscopy 05/17/2012 showed 2 small polyps that were removed--path showed Hyperplastic-not adenomatous.  Still needs repeat 5 yrs.  . Angioedema of lips    saw allergist 11/2011  . Asthmatic bronchitis , chronic ([place]) 01/11/2012   Question occupational asthma with no specific agents identified. He does notice that some areas of his job are particularly dusty and dirty. Currently he is well controlled using Symbicort through an AeroChamber and rarely needing his rescue inhaler. This would be an acceptable long-term status. Elevated nonspecific total IgE and peripheral eosinophilia to suggest an atopic trigger with sensitization to something Consider Daliresp for future use if needed   . Chronic recurrent sinusitis 02/28/2012  . Depression    Prozac in the past not much help.  Spontaneously resolved.  . Diabetes mellitus without complication ([place])   . Diverticulosis   . Generalized anxiety disorder 08/11/2012  . Hemorrhoids    ext and int  . Hypertension 2017  . Hypertriglyceridemia    fish oil OTC  . Hypogonadism, male 04/08/2011   Felt improved on androgel, but testost levels did no improve.  Several time has been  <100 (saw [facility] in 2011 for this, w/u for secondary hypogonadism was unremarkable), most recent testosterone check via [facility] 10/2010 was 1.15 ng/ml.   As of 08/06/16, Dr. Dwyane Dee ([facility]) is managing this problem.  . Insomnia   . Internal hemorrhoids, bleeding and sicharge 05/29/2013  . OSA on CPAP   . Rotator cuff tear 2011   [facility]: conservative mgmt--symptoms stable as of 04/2011.    Past Surgical History:  Procedure Laterality Date  . adnoids    . CARDIOVASCULAR STRESS TEST     Normal stress nuclear study, EF normal. +Hypertensive bp response--subsequent 24H ambulatory BP monitoring confirmed HTN.  . COLONOSCOPY  05/2006, 05/2012  . HEMORRHOID BANDING    . POLYPECTOMY     Adenomatous 2007; hyperplastic 2013.  Repeat 5 yrs.  . TRANSTHORACIC ECHOCARDIOGRAM  01/30/16   Normal except mild aortic dilatation  . WISDOM TOOTH EXTRACTION      Outpatient Medications Prior to Visit  Medication Sig Dispense Refill  . albuterol (VENTOLIN HFA) 108 (90 Base) MCG/ACT inhaler Inhale 1-2 puffs into the lungs every 4 (four) hours as needed for wheezing. 1 Inhaler 1  . budesonide-formoterol (SYMBICORT) 160-4.5 MCG/ACT inhaler Inhale 2 puffs into the lungs 2 (two) times daily. 1 Inhaler 12  . CIALIS 20 MG tablet Take 1 tablet (20 mg total) by mouth daily as needed. Reported on 03/11/2016 10 tablet 12  . clomiPHENE (CLOMID) 50 MG tablet Take 1 tablet (50 mg total) by  mouth daily. Take 1/2 tab daily 20 tablet 2  . EPINEPHrine (EPIPEN) 0.3 mg/0.3 mL DEVI Inject 0.3 mLs (0.3 mg total) into the muscle once. 1 Device 3  . glucose blood test strip Check glucose bid 100 each 12  . Multiple Vitamins-Minerals (MULTIVITAMIN WITH MINERALS) tablet Take 1 tablet by mouth daily.    . TURMERIC PO Take 1 capsule by mouth every other day.    . metFORMIN (GLUCOPHAGE) 1000 MG tablet Take 1 tablet (1,000 mg total) by mouth 2 (two) times daily with a meal. 180 tablet 0  . amLODipine (NORVASC) 10 MG tablet Take 1  tablet (10 mg total) by mouth daily. 90 tablet 3  . predniSONE (DELTASONE) 20 MG tablet 2 tabs po qd x 5d (Patient not taking: Reported on 09/21/2016) 10 tablet 0   No facility-administered medications prior to visit.     Allergies  Allergen Reactions  . Molds & Smuts Other (See Comments)    ROS As per HPI  PE: Blood pressure 133/80, pulse 84, temperature 97.9 F (36.6 C), temperature source Oral, resp. rate 16, weight 206 lb (93.4 kg), SpO2 95 %. Gen: Alert, well appearing.  Patient is oriented to person, place, time, and situation. AFFECT: pleasant, lucid thought and speech. Foot exam - both normal; no swelling, tenderness or skin or vascular lesions. Color and temperature is normal. Sensation is intact. Peripheral pulses are palpable. Toenails are normal.   LABS:  Lab Results  Component Value Date   TSH 1.02 09/30/2011   Lab Results  Component Value Date   WBC 8.5 06/16/2016   HGB 14.8 06/16/2016   HCT 40.7 06/16/2016   MCV 82.9 06/16/2016   PLT 289 06/16/2016   Lab Results  Component Value Date   CREATININE 0.80 09/03/2016   BUN 15 09/03/2016   NA 137 09/03/2016   K 4.2 09/03/2016   CL 102 09/03/2016   CO2 28 09/03/2016   Lab Results  Component Value Date   ALT 31 09/03/2016   AST 16 09/03/2016   ALKPHOS 58 09/03/2016   BILITOT 0.4 09/03/2016   Lab Results  Component Value Date   CHOL 155 09/25/2015   Lab Results  Component Value Date   HDL 34.80 (L) 09/25/2015   No results found for: [facility] Lab Results  Component Value Date   TRIG 246.0 (H) 09/25/2015   Lab Results  Component Value Date   CHOLHDL 4 09/25/2015   Lab Results  Component Value Date   PSA 0.89 06/03/2015   PSA 1.06 09/13/2013   Lab Results  Component Value Date   HGBA1C 7.9 (H) 09/03/2016    IMPRESSION AND PLAN:  DM 2, not ideal control. Noncompliant with diet and home gluc monitoring. He has restarted diabetic diet and home gluco monitoring. If this does not bring  A1c down at next check in 3 mo, we'll have to add a 2nd med to his metformin. Feet exam normal today.  An After Visit Summary was printed and given to the patient.  FOLLOW UP: Return in about 3 months (around 12/20/2016) for routine chronic illness f/u (DM 2).  Signed:  Crissie Sickles, MD           09/21/2016

## 2016-09-23 NOTE — Telephone Encounter (Signed)
Patient had appt 09/21/16.

## 2016-09-24 NOTE — Telephone Encounter (Signed)
Pt was seen on 09/21/16 for DM f/u. Will send in refills when the time comes.

## 2016-09-28 DIAGNOSIS — G4733 Obstructive sleep apnea (adult) (pediatric): Secondary | ICD-10-CM | POA: Diagnosis not present

## 2016-10-06 ENCOUNTER — Ambulatory Visit: Payer: Commercial Managed Care - HMO | Admitting: Endocrinology

## 2016-10-15 DIAGNOSIS — G4733 Obstructive sleep apnea (adult) (pediatric): Secondary | ICD-10-CM | POA: Diagnosis not present

## 2016-10-23 ENCOUNTER — Ambulatory Visit (INDEPENDENT_AMBULATORY_CARE_PROVIDER_SITE_OTHER): Payer: Commercial Managed Care - HMO | Admitting: Pulmonary Disease

## 2016-10-23 ENCOUNTER — Other Ambulatory Visit: Payer: Self-pay

## 2016-10-23 ENCOUNTER — Other Ambulatory Visit (INDEPENDENT_AMBULATORY_CARE_PROVIDER_SITE_OTHER): Payer: Commercial Managed Care - HMO

## 2016-10-23 ENCOUNTER — Encounter: Payer: Self-pay | Admitting: Pulmonary Disease

## 2016-10-23 VITALS — BP 148/86 | HR 79 | Ht 70.5 in | Wt 212.2 lb

## 2016-10-23 DIAGNOSIS — E291 Testicular hypofunction: Secondary | ICD-10-CM

## 2016-10-23 DIAGNOSIS — J329 Chronic sinusitis, unspecified: Secondary | ICD-10-CM

## 2016-10-23 DIAGNOSIS — G4733 Obstructive sleep apnea (adult) (pediatric): Secondary | ICD-10-CM

## 2016-10-23 DIAGNOSIS — Z789 Other specified health status: Secondary | ICD-10-CM

## 2016-10-23 DIAGNOSIS — Z9989 Dependence on other enabling machines and devices: Secondary | ICD-10-CM

## 2016-10-23 DIAGNOSIS — J342 Deviated nasal septum: Secondary | ICD-10-CM | POA: Diagnosis not present

## 2016-10-23 LAB — TESTOSTERONE: Testosterone: 349.53 ng/dL (ref 300.00–890.00)

## 2016-10-23 NOTE — Patient Instructions (Signed)
Will arrange for CPAP mask refitting  Will have your CPAP setting changed to 6 cm H2O  Call if having trouble after pressure change  Follow up in 1 year

## 2016-10-23 NOTE — Progress Notes (Signed)
Current Outpatient Prescriptions on File Prior to Visit  Medication Sig  . budesonide-formoterol (SYMBICORT) 160-4.5 MCG/ACT inhaler Inhale 2 puffs into the lungs 2 (two) times daily.  Marland Kitchen CIALIS 20 MG tablet Take 1 tablet (20 mg total) by mouth daily as needed. Reported on 03/11/2016  . glucose blood test strip Check glucose bid  . metFORMIN (GLUCOPHAGE) 1000 MG tablet Take 1 tablet (1,000 mg total) by mouth 2 (two) times daily with a meal.  . Multiple Vitamins-Minerals (MULTIVITAMIN WITH MINERALS) tablet Take 1 tablet by mouth daily.  . TURMERIC PO Take 1 capsule by mouth every other day.  . albuterol (VENTOLIN HFA) 108 (90 Base) MCG/ACT inhaler Inhale 1-2 puffs into the lungs every 4 (four) hours as needed for wheezing. (Patient not taking: Reported on 10/23/2016)  . amLODipine (NORVASC) 10 MG tablet Take 1 tablet (10 mg total) by mouth daily.  . clomiPHENE (CLOMID) 50 MG tablet Take 1 tablet (50 mg total) by mouth daily. Take 1/2 tab daily (Patient not taking: Reported on 10/23/2016)  . EPINEPHrine (EPIPEN) 0.3 mg/0.3 mL DEVI Inject 0.3 mLs (0.3 mg total) into the muscle once. (Patient not taking: Reported on 10/23/2016)   No current facility-administered medications on file prior to visit.      Chief Complaint  Patient presents with  . Follow-up    Wears CPAP most nights. Pt has been inconsistent d/t frequent sinus infections. Needs new mask.  DME: [facility]     Sleep tests HST 01/24/16 >> AHI 53.8, SaO2 low 80% Auto CPAP 09/23/16 to 10/22/16 >> used on 30 of 30 nights with average 3 hrs 15 min.  Average AHI 0.3 with median CPAP 7 and 95 th percentile CPAP 9 cm H2O  Past medical history Asthma, Chronic sinusitis, Depression, Anxiety, Acrophobia, DM, HTN, HLD, Diverticulosis, Hypogonadism  Past surgical history, Family history, Social history, Allergies reviewed  Vital Signs BP (!) 148/86 (BP Location: Left Arm, Cuff Size: Normal)   Pulse 79   Ht 5' 10.5" (1.791 m)   Wt 212 lb 3.2 oz (96.3  kg)   SpO2 95%   BMI 30.02 kg/m   History of Present Illness Douglas [name] is a 56 y.o. male with obstructive sleep apnea.  He has trouble wearing CPAP.  He gets recurrent sinus infections.  He has tried allergies medicines >> not much help.  He uses nasal irrigation intermittently.  He has full face mask, but current mask slides and looses seal.  He was seen by ENT a few years ago, but told his septal deviation wasn't bad enough to warrant surgery.   Physical Exam  General - pleasant Eyes - wears glasses ENT - no sinus tenderness, clear nasal discharge, septal deviation, MP 3, no oral exudate, no LAN Cardiac - regular, no murmur Chest - no wheeze/ rales Back - no tenderness Abd - soft, non tender Ext - no edema Neuro - normal strength Skin - no rashes Psych - normal mood   Assessment/Plan  Obstructive sleep apnea. - he has trouble using CPAP due to mask issues and recurrent sinus infections - will have his mask refit - will change him to fixed setting of 6 cm H2O  Nasal septal deviation with recurrent sinus infections. - advised him to try using nasal irrigation on daily basis   Patient Instructions  Will arrange for CPAP mask refitting  Will have your CPAP setting changed to 6 cm H2O  Call if having trouble after pressure change  Follow up in 1 year  Chesley Mires, MD [facility] Pager:  [phone] 10/23/2016, 12:31 PM

## 2016-10-23 NOTE — Addendum Note (Signed)
Addended by: Ralph Dowdy on: 10/23/2016 08:23 AM   Modules accepted: Orders

## 2016-10-28 ENCOUNTER — Encounter: Payer: Self-pay | Admitting: Endocrinology

## 2016-10-28 ENCOUNTER — Ambulatory Visit (INDEPENDENT_AMBULATORY_CARE_PROVIDER_SITE_OTHER): Payer: Commercial Managed Care - HMO | Admitting: Endocrinology

## 2016-10-28 VITALS — BP 138/80 | HR 80 | Ht 71.0 in | Wt 213.0 lb

## 2016-10-28 DIAGNOSIS — G4733 Obstructive sleep apnea (adult) (pediatric): Secondary | ICD-10-CM | POA: Diagnosis not present

## 2016-10-28 DIAGNOSIS — E291 Testicular hypofunction: Secondary | ICD-10-CM | POA: Diagnosis not present

## 2016-10-28 DIAGNOSIS — E1165 Type 2 diabetes mellitus with hyperglycemia: Secondary | ICD-10-CM | POA: Diagnosis not present

## 2016-10-28 MED ORDER — BUPROPION HCL ER (SR) 150 MG PO TB12: 150.0000 mg | ORAL_TABLET | Freq: Two times a day (BID) | ORAL | 1 refills | Status: DC

## 2016-10-28 NOTE — Progress Notes (Signed)
Patient ID: Douglas [name], male   DOB: [birth year], 56 y.o.   MRN: [id number]           Chief complaint: Fatigue  Referring physician: Ernestine Conrad   Reason for consultation: Low testosterone level  History of Present Illness  Hypogonadismwas diagnosed in 2009  He was apparently initially seen by his psychiatrist and was having difficulties with fatigue, agoraphobia and decreased libido as well as sexual dysfunction. He was told that his testosterone level was significantly low and was given testosterone supplements probably with AndroGel Subsequently the patient has been treated by various physicians including the urologist with testosterone supplements, mostly AndroGel He thinks that his testosterone levels have never been normal with AndroGel He was also tried on monthly injections for about 3 months without consistent benefit also  In 2016 he was given [place]   RECENT history: He has had complaints offatigue, decreased motivation, decreased libido and continued erectile dysfunction. Initial evaluation showed low testosterone and LH levels and normal prolactin Since he had not had clinical benefit from previous treatments he was told to start clomiphene He is taking 25 mg daily now  However he does not feel any better with the above symptoms Has gained a little weight His testosterone level is significantly better, this was drawn in the morning    His lab results showtestosterone levels of:  Lab Results  Component Value Date   TESTOSTERONE 349.53 10/23/2016   TESTOSTERONE 133 (L) 09/03/2016   TESTOSTERONE 228.54 (L) 06/03/2015   TESTOSTERONE 120 (L) 03/08/2014    Prolactin level:Normal   Lab Results  Component Value Date   LH 2.89 09/03/2016         Allergies as of 10/28/2016      Reactions   Molds & Smuts Other (See Comments)      Medication List       Accurate as of 10/28/16 12:03 PM. Always use your most recent med list.            albuterol 108 (90 Base) MCG/ACT inhaler Commonly known as:  VENTOLIN HFA Inhale 1-2 puffs into the lungs every 4 (four) hours as needed for wheezing.   amLODipine 10 MG tablet Commonly known as:  NORVASC Take 1 tablet (10 mg total) by mouth daily.   budesonide-formoterol 160-4.5 MCG/ACT inhaler Commonly known as:  SYMBICORT Inhale 2 puffs into the lungs 2 (two) times daily.   buPROPion 150 MG 12 hr tablet Commonly known as:  WELLBUTRIN SR Take 1 tablet (150 mg total) by mouth 2 (two) times daily.   CIALIS 20 MG tablet Generic drug:  tadalafil Take 1 tablet (20 mg total) by mouth daily as needed. Reported on 03/11/2016   clomiPHENE 50 MG tablet Commonly known as:  CLOMID Take 1 tablet (50 mg total) by mouth daily. Take 1/2 tab daily   glucose blood test strip Check glucose bid   metFORMIN 1000 MG tablet Commonly known as:  GLUCOPHAGE Take 1 tablet (1,000 mg total) by mouth 2 (two) times daily with a meal.   multivitamin with minerals tablet Take 1 tablet by mouth daily.   TURMERIC PO Take 1 capsule by mouth every other day.       Allergies:  Allergies  Allergen Reactions  . Molds & Smuts Other (See Comments)    Past Medical History:  Diagnosis Date  . 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  . Adenomatous colon polyp  05/07/06; 05/2012   Repeat colonoscopy 05/17/2012 showed 2 small polyps that were removed--path showed Hyperplastic-not adenomatous.  Still needs repeat 5 yrs.  . Angioedema of lips    saw allergist 11/2011  . Asthmatic bronchitis , chronic ([place]) 01/11/2012   Question occupational asthma with no specific agents identified. He does notice that some areas of his job are particularly dusty and dirty. Currently he is well controlled using Symbicort through an AeroChamber and rarely needing his rescue inhaler. This would be an acceptable long-term status. Elevated nonspecific total IgE and  peripheral eosinophilia to suggest an atopic trigger with sensitization to something Consider Daliresp for future use if needed   . Chronic recurrent sinusitis 02/28/2012  . Depression    Prozac in the past not much help.  Spontaneously resolved.  . Diabetes mellitus without complication ([place])   . Diverticulosis   . Generalized anxiety disorder 08/11/2012  . Hemorrhoids    ext and int  . Hypertension 2017  . Hypertriglyceridemia    fish oil OTC  . Hypogonadism, male 04/08/2011   Felt improved on androgel, but testost levels did no improve.  Several time has been <100 (saw [facility] in 2011 for this, w/u for secondary hypogonadism was unremarkable), most recent testosterone check via [facility] 10/2010 was 1.15 ng/ml.   As of 08/06/16, Dr. Dwyane Dee ([facility]) is managing this problem.  . Insomnia   . Internal hemorrhoids, bleeding and sicharge 05/29/2013  . OSA on CPAP   . Rotator cuff tear 2011   [facility]: conservative mgmt--symptoms stable as of 04/2011.    Past Surgical History:  Procedure Laterality Date  . adnoids    . CARDIOVASCULAR STRESS TEST     Normal stress nuclear study, EF normal. +Hypertensive bp response--subsequent 24H ambulatory BP monitoring confirmed HTN.  . COLONOSCOPY  05/2006, 05/2012  . HEMORRHOID BANDING    . POLYPECTOMY     Adenomatous 2007; hyperplastic 2013.  Repeat 5 yrs.  . TRANSTHORACIC ECHOCARDIOGRAM  01/30/16   Normal except mild aortic dilatation  . WISDOM TOOTH EXTRACTION      Family History  Problem Relation Age of Onset  . Hypertension Mother   . Diabetes Mother   . CVA Mother   . Hypertension Father   . Diabetes Father   . Dementia Father   . Heart disease Father   . CAD Father     Social History:  reports that he has never smoked. He has never used smokeless tobacco. He reports that he drinks alcohol. He reports that he does not use drugs.  Review of Systems  Respiratory: Positive for daytime sleepiness.   Psychiatric/Behavioral:  Positive for insomnia.   DIABETES: He had significant hyperglycemia at onset  His last A1c was 7.9 He has not followed up with his PCP who is managing this He thinks his blood sugars are improving with watching his diet better which he had not been doing before Has gained weight    Wt Readings from Last 3 Encounters:  10/28/16 213 lb (96.6 kg)  10/23/16 212 lb 3.2 oz (96.3 kg)  09/21/16 206 lb (93.4 kg)     Lab Results  Component Value Date   HGBA1C 7.9 (H) 09/03/2016   HGBA1C 6.5 12/26/2015   HGBA1C 6.4 09/25/2015   Lab Results  Component Value Date   MICROALBUR <0.7 09/25/2015   CREATININE 0.80 09/03/2016     General Examination:   BP 138/80   Pulse 80   Ht 5\' 11"  (1.803 m)  Wt 213 lb (96.6 kg)   SpO2 95%   BMI 29.71 kg/m    Assessment/ Plan:    Hypogonadotropic hypogonadism  Patient appears to have had improved testosterone level with starting clomiphene 25 mg daily Despite significant improvement in his level is still complains of fatigue, decreased libido and lack of motivation He also has some late insomnia Most likely has element of depression currently, not on medications  Advised him that his testosterone level may improve further as since he only started clomiphene about a month ago He will continue this Add BUPROPION 150 mg daily for depression, he will discuss continuing this with his PCP also   DIABETES on metformin:  Advised him that his A1c will need to improve He is trying to do better with diet and exercise He will follow-up with his PCP in April, May consider adding a GLP-1 or SGLT 2 drug for better efficacy and weight loss    Valoria Tamburri 10/28/2016, 12:03 PM

## 2016-10-30 ENCOUNTER — Encounter: Payer: Self-pay | Admitting: Family Medicine

## 2016-10-30 DIAGNOSIS — G4733 Obstructive sleep apnea (adult) (pediatric): Secondary | ICD-10-CM | POA: Diagnosis not present

## 2016-11-07 ENCOUNTER — Encounter: Payer: Self-pay | Admitting: Family Medicine

## 2016-11-09 NOTE — Telephone Encounter (Signed)
FYI

## 2016-11-12 DIAGNOSIS — G4733 Obstructive sleep apnea (adult) (pediatric): Secondary | ICD-10-CM | POA: Diagnosis not present

## 2016-11-27 DIAGNOSIS — G4733 Obstructive sleep apnea (adult) (pediatric): Secondary | ICD-10-CM | POA: Diagnosis not present

## 2016-11-28 ENCOUNTER — Other Ambulatory Visit: Payer: Self-pay | Admitting: Endocrinology

## 2016-12-11 DIAGNOSIS — E119 Type 2 diabetes mellitus without complications: Secondary | ICD-10-CM | POA: Diagnosis not present

## 2016-12-11 LAB — HM DIABETES EYE EXAM

## 2016-12-17 ENCOUNTER — Encounter: Payer: Self-pay | Admitting: Family Medicine

## 2016-12-18 ENCOUNTER — Ambulatory Visit (INDEPENDENT_AMBULATORY_CARE_PROVIDER_SITE_OTHER): Payer: Commercial Managed Care - HMO | Admitting: Family Medicine

## 2016-12-18 ENCOUNTER — Encounter: Payer: Self-pay | Admitting: Family Medicine

## 2016-12-18 VITALS — BP 137/75 | HR 66 | Temp 98.2°F | Resp 16 | Ht 71.0 in | Wt 200.5 lb

## 2016-12-18 DIAGNOSIS — E119 Type 2 diabetes mellitus without complications: Secondary | ICD-10-CM | POA: Diagnosis not present

## 2016-12-18 DIAGNOSIS — R7989 Other specified abnormal findings of blood chemistry: Secondary | ICD-10-CM

## 2016-12-18 DIAGNOSIS — I10 Essential (primary) hypertension: Secondary | ICD-10-CM | POA: Diagnosis not present

## 2016-12-18 DIAGNOSIS — E291 Testicular hypofunction: Secondary | ICD-10-CM

## 2016-12-18 DIAGNOSIS — E7800 Pure hypercholesterolemia, unspecified: Secondary | ICD-10-CM

## 2016-12-18 LAB — BASIC METABOLIC PANEL
BUN: 15 mg/dL (ref 6–23)
CALCIUM: 9.4 mg/dL (ref 8.4–10.5)
CO2: 27 meq/L (ref 19–32)
CREATININE: 0.95 mg/dL (ref 0.40–1.50)
Chloride: 106 mEq/L (ref 96–112)
GFR: 86.64 mL/min (ref >60.00)
GLUCOSE: 131 mg/dL — AB (ref 70–99)
Potassium: 4.4 mEq/L (ref 3.5–5.1)
Sodium: 141 mEq/L (ref 135–145)

## 2016-12-18 LAB — LIPID PANEL
Cholesterol: 134 mg/dL (ref 0–200)
HDL: 27.6 mg/dL — AB (ref >39.00)
NonHDL: 106.15
TRIGLYCERIDES: 270 mg/dL — AB (ref 0.0–149.0)
Total CHOL/HDL Ratio: 5
VLDL: 54 mg/dL — AB (ref 0.0–40.0)

## 2016-12-18 LAB — LDL CHOLESTEROL, DIRECT: Direct LDL: 63 mg/dL

## 2016-12-18 LAB — HEMOGLOBIN A1C: HEMOGLOBIN A1C: 6.9 % — AB (ref 4.6–6.5)

## 2016-12-18 LAB — MICROALBUMIN / CREATININE URINE RATIO
Creatinine,U: 159.9 mg/dL
Microalb Creat Ratio: 0.5 mg/g (ref 0.0–30.0)
Microalb, Ur: 0.8 mg/dL (ref 0.0–1.9)

## 2016-12-18 LAB — TESTOSTERONE: Testosterone: 399.07 ng/dL (ref 300.00–890.00)

## 2016-12-18 MED ORDER — IRBESARTAN 75 MG PO TABS: 75.0000 mg | ORAL_TABLET | Freq: Every day | ORAL | 3 refills | Status: DC

## 2016-12-18 NOTE — Progress Notes (Signed)
Pre visit review using our clinic review tool, if applicable. No additional management support is needed unless otherwise documented below in the visit note. 

## 2016-12-18 NOTE — Progress Notes (Signed)
OFFICE VISIT  12/18/2016   CC:  Chief Complaint  Patient presents with  . Follow-up    [facility], pt is fasting.    HPI:    Patient is a 56 y.o. Caucasian male who presents for 3 mo f/u DM 2, HTN, hypertriglyceridemia. His hypogonadism is managed by Dr. Dwyane Dee in endocrinology.  Will release future order for testosterone level repeat today ordered by Dr. Dwyane Dee.  DM 2: avg 130-150 2H PP.  No fastings.  Compliant with meds, somewhat with diet.    HTN: Only occ check, 130s-140s, doesn't recall diastolics.  HR "didn't seem high". Pt is pretty much not monitoring this well.  Past Medical History:  Diagnosis Date  . 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  . Adenomatous colon polyp 05/07/06; 05/2012   Repeat colonoscopy 05/17/2012 showed 2 small polyps that were removed--path showed Hyperplastic-not adenomatous.  Still needs repeat 5 yrs.  . Angioedema of lips    saw allergist 11/2011  . Asthmatic bronchitis , chronic ([place]) 01/11/2012   Question occupational asthma with no specific agents identified. He does notice that some areas of his job are particularly dusty and dirty. Currently he is well controlled using Symbicort through an AeroChamber and rarely needing his rescue inhaler. This would be an acceptable long-term status. Elevated nonspecific total IgE and peripheral eosinophilia to suggest an atopic trigger with sensitization to something Consider Daliresp for future use if needed   . Chronic recurrent sinusitis 02/28/2012  . Depression    Prozac in the past not much help.  Spontaneously resolved.  . Diabetes mellitus without complication ([place])   . Diverticulosis   . Generalized anxiety disorder 08/11/2012  . Hemorrhoids    ext and int  . Hypertension 2017  . Hypertriglyceridemia    fish oil OTC  . Hypogonadotropic hypogonadism ([place]) 04/08/2011   Felt improved on androgel, but testost levels did no improve.  Several  time has been <100 (saw [facility] in 2011 for this, w/u for secondary hypogonadism was unremarkable), most recent testosterone check via [facility] 10/2010 was 1.15 ng/ml.   As of 08/06/16, Dr. Dwyane Dee ([facility]) is managing this problem: started clomiphene.  . Insomnia   . Internal hemorrhoids, bleeding and sicharge 05/29/2013  . OSA on CPAP    setting changed to 6 cm H20 (fixed) 10/2016 by pulm.  . Rotator cuff tear 2011   [facility]: conservative mgmt--symptoms stable as of 04/2011.    Past Surgical History:  Procedure Laterality Date  . adnoids    . CARDIOVASCULAR STRESS TEST     Normal stress nuclear study, EF normal. +Hypertensive bp response--subsequent 24H ambulatory BP monitoring confirmed HTN.  . COLONOSCOPY  05/2006, 05/2012  . HEMORRHOID BANDING    . POLYPECTOMY     Adenomatous 2007; hyperplastic 2013.  Repeat 5 yrs.  . TRANSTHORACIC ECHOCARDIOGRAM  01/30/16   Normal except mild aortic dilatation  . WISDOM TOOTH EXTRACTION      Outpatient Medications Prior to Visit  Medication Sig Dispense Refill  . budesonide-formoterol (SYMBICORT) 160-4.5 MCG/ACT inhaler Inhale 2 puffs into the lungs 2 (two) times daily. 1 Inhaler 12  . buPROPion (WELLBUTRIN SR) 150 MG 12 hr tablet Take 1 tablet (150 mg total) by mouth daily. 30 tablet 0  . CIALIS 20 MG tablet Take 1 tablet (20 mg total) by mouth daily as needed. Reported on 03/11/2016 10 tablet 12  . glucose blood test  strip Check glucose bid 100 each 12  . metFORMIN (GLUCOPHAGE) 1000 MG tablet Take 1 tablet (1,000 mg total) by mouth 2 (two) times daily with a meal. 180 tablet 3  . albuterol (VENTOLIN HFA) 108 (90 Base) MCG/ACT inhaler Inhale 1-2 puffs into the lungs every 4 (four) hours as needed for wheezing. (Patient not taking: Reported on 10/23/2016) 1 Inhaler 1  . amLODipine (NORVASC) 10 MG tablet Take 1 tablet (10 mg total) by mouth daily. 90 tablet 3  . clomiPHENE (CLOMID) 50 MG tablet Take 1 tablet (50 mg total) by mouth daily. Take  1/2 tab daily (Patient not taking: Reported on 12/18/2016) 20 tablet 2  . Multiple Vitamins-Minerals (MULTIVITAMIN WITH MINERALS) tablet Take 1 tablet by mouth daily.    . TURMERIC PO Take 1 capsule by mouth every other day.     No facility-administered medications prior to visit.     Allergies  Allergen Reactions  . Molds & Smuts Other (See Comments)    ROS As per HPI  PE: Blood pressure 137/75, pulse 66, temperature 98.2 F (36.8 C), temperature source Oral, resp. rate 16, height 5\' 11"  (1.803 m), weight 200 lb 8 oz (90.9 kg), SpO2 98 %. Gen: Alert, well appearing.  Patient is oriented to person, place, time, and situation. AFFECT: pleasant, lucid thought and speech. CV: RRR, no m/r/g.   LUNGS: CTA bilat, nonlabored resps, good aeration in all lung fields. EXT: no clubbing, cyanosis, or edema.    LABS:  Lab Results  Component Value Date   TSH 1.02 09/30/2011   Lab Results  Component Value Date   WBC 8.5 06/16/2016   HGB 14.8 06/16/2016   HCT 40.7 06/16/2016   MCV 82.9 06/16/2016   PLT 289 06/16/2016   Lab Results  Component Value Date   CREATININE 0.80 09/03/2016   BUN 15 09/03/2016   NA 137 09/03/2016   K 4.2 09/03/2016   CL 102 09/03/2016   CO2 28 09/03/2016   Lab Results  Component Value Date   ALT 31 09/03/2016   AST 16 09/03/2016   ALKPHOS 58 09/03/2016   BILITOT 0.4 09/03/2016   Lab Results  Component Value Date   CHOL 155 09/25/2015   Lab Results  Component Value Date   HDL 34.80 (L) 09/25/2015   No results found for: [facility] Lab Results  Component Value Date   TRIG 246.0 (H) 09/25/2015   Lab Results  Component Value Date   CHOLHDL 4 09/25/2015   Lab Results  Component Value Date   PSA 0.89 06/03/2015   PSA 1.06 09/13/2013   Lab Results  Component Value Date   HGBA1C 7.9 (H) 09/03/2016   IMPRESSION AND PLAN:  1) DM 2, home glucoses 2H PP fine. Recheck HbA1c and urine microalb/cr today.  2) HTN: not ideal control. Add  irbesartan 75mg  qd today.  Continue home monitoring; encouraged pt to write his numbers down and bring to next f/u visit so we can better assess his control. Lytes/cr today.  3) Hx of mixed hyperlipidemia:  Recheck FLP today.  Goal LDL < 70. Needs to be on statin anyway, regardless of cholesterol numbers since he has DM.  An After Visit Summary was printed and given to the patient.  FOLLOW UP: Return in about 3 months (around 03/19/2017) for annual CPE (fasting).  Signed:  Crissie Sickles, MD           12/18/2016

## 2016-12-25 ENCOUNTER — Encounter: Payer: Self-pay | Admitting: Endocrinology

## 2016-12-25 ENCOUNTER — Ambulatory Visit (INDEPENDENT_AMBULATORY_CARE_PROVIDER_SITE_OTHER): Payer: Commercial Managed Care - HMO | Admitting: Endocrinology

## 2016-12-25 VITALS — BP 138/78 | HR 83 | Ht 71.0 in | Wt 201.0 lb

## 2016-12-25 DIAGNOSIS — R5383 Other fatigue: Secondary | ICD-10-CM | POA: Diagnosis not present

## 2016-12-25 DIAGNOSIS — E291 Testicular hypofunction: Secondary | ICD-10-CM

## 2016-12-25 NOTE — Progress Notes (Signed)
Patient ID: Douglas [name], male   DOB: 1961/08/02, 56 y.o.   MRN: [id number]           Chief complaint: Fatigue  Referring physician: Ernestine Conrad   Reason for consultation: Low testosterone level  History of Present Illness  Hypogonadismwas diagnosed in 2009  He was apparently initially seen by his psychiatrist and was having difficulties with fatigue, agoraphobia and decreased libido as well as sexual dysfunction. He was told that his testosterone level was significantly low and was given testosterone supplements probably with AndroGel Subsequently the patient has been treated by various physicians including the urologist with testosterone supplements, mostly AndroGel He thinks that his testosterone levels have never been normal with AndroGel He was also tried on monthly injections for about 3 months without consistent benefit also  In 2016 he was given [place]   RECENT history: He had complaints offatigue, decreased motivation, decreased libido and erectile dysfunction on his initial consultation. Initial evaluation showed low free testosterone of 4.5 and normal LH levels and normal prolactin Since he had not had clinical benefit from previous treatments he was told to start clomiphene He is taking 25 mg daily   However he still does not feel any better with the above symptoms and mostly complaining about fatigue He was also tried on His testosterone level is further improved at about 400 now fasting   His lab results showtestosterone levels as follows:  Lab Results  Component Value Date   TESTOSTERONE 399.07 12/18/2016   TESTOSTERONE 349.53 10/23/2016   TESTOSTERONE 133 (L) 09/03/2016   TESTOSTERONE 228.54 (L) 06/03/2015      Lab Results  Component Value Date   LH 2.89 09/03/2016         Allergies as of 12/25/2016      Reactions   Molds & Smuts Other (See Comments)      Medication List       Accurate as of 12/25/16  8:43 AM. Always use your most  recent med list.          albuterol 108 (90 Base) MCG/ACT inhaler Commonly known as:  VENTOLIN HFA Inhale 1-2 puffs into the lungs every 4 (four) hours as needed for wheezing.   amLODipine 10 MG tablet Commonly known as:  NORVASC Take 1 tablet (10 mg total) by mouth daily.   budesonide-formoterol 160-4.5 MCG/ACT inhaler Commonly known as:  SYMBICORT Inhale 2 puffs into the lungs 2 (two) times daily.   buPROPion 150 MG 12 hr tablet Commonly known as:  WELLBUTRIN SR Take 1 tablet (150 mg total) by mouth daily.   CIALIS 20 MG tablet Generic drug:  tadalafil Take 1 tablet (20 mg total) by mouth daily as needed. Reported on 03/11/2016   glucose blood test strip Check glucose bid   irbesartan 75 MG tablet Commonly known as:  AVAPRO Take 1 tablet (75 mg total) by mouth daily.   metFORMIN 1000 MG tablet Commonly known as:  GLUCOPHAGE Take 1 tablet (1,000 mg total) by mouth 2 (two) times daily with a meal.       Allergies:  Allergies  Allergen Reactions  . Molds & Smuts Other (See Comments)    Past Medical History:  Diagnosis Date  . 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  . Adenomatous colon polyp 05/07/06; 05/2012   Repeat colonoscopy 05/17/2012 showed 2 small polyps that were removed--path showed Hyperplastic-not adenomatous.  Still needs repeat 5 yrs.  Mar[place] Kitchen  Angioedema of lips    saw allergist 11/2011  . Asthmatic bronchitis , chronic ([place]) 01/11/2012   Question occupational asthma with no specific agents identified. He does notice that some areas of his job are particularly dusty and dirty. Currently he is well controlled using Symbicort through an AeroChamber and rarely needing his rescue inhaler. This would be an acceptable long-term status. Elevated nonspecific total IgE and peripheral eosinophilia to suggest an atopic trigger with sensitization to something Consider Daliresp for future use if needed     . Chronic recurrent sinusitis 02/28/2012  . Depression    Prozac in the past not much help.  Spontaneously resolved.  . Diabetes mellitus without complication ([place])   . Diverticulosis   . Generalized anxiety disorder 08/11/2012  . Hemorrhoids    ext and int  . Hypertension 2017  . Hypertriglyceridemia    fish oil OTC  . Hypogonadotropic hypogonadism ([place]) 04/08/2011   Felt improved on androgel, but testost levels did no improve.  Several time has been <100 (saw [facility] in 2011 for this, w/u for secondary hypogonadism was unremarkable), most recent testosterone check via [facility] 10/2010 was 1.15 ng/ml.   As of 08/06/16, Dr. Dwyane Dee ([facility]) is managing this problem: started clomiphene.  . Insomnia   . Internal hemorrhoids, bleeding and sicharge 05/29/2013  . OSA on CPAP    setting changed to 6 cm H20 (fixed) 10/2016 by pulm.  . Rotator cuff tear 2011   [facility]: conservative mgmt--symptoms stable as of 04/2011.    Past Surgical History:  Procedure Laterality Date  . adnoids    . CARDIOVASCULAR STRESS TEST     Normal stress nuclear study, EF normal. +Hypertensive bp response--subsequent 24H ambulatory BP monitoring confirmed HTN.  . COLONOSCOPY  05/2006, 05/2012  . HEMORRHOID BANDING    . POLYPECTOMY     Adenomatous 2007; hyperplastic 2013.  Repeat 5 yrs.  . TRANSTHORACIC ECHOCARDIOGRAM  01/30/16   Normal except mild aortic dilatation  . WISDOM TOOTH EXTRACTION      Family History  Problem Relation Age of Onset  . Hypertension Mother   . Diabetes Mother   . CVA Mother   . Hypertension Father   . Diabetes Father   . Dementia Father   . Heart disease Father   . CAD Father     Social History:  reports that he has never smoked. He has never used smokeless tobacco. He reports that he drinks alcohol. He reports that he does not use drugs.  Review of Systems  Respiratory: Positive for daytime sleepiness.   Psychiatric/Behavioral: Positive for insomnia.   DIABETES: He  had significant hyperglycemia at onset  His last A1c was Improved at 6.9 He is doing better with exercise and is trying to lose weight  Taking metformin Has had recent follow-up with PCP   Wt Readings from Last 3 Encounters:  12/25/16 201 lb (91.2 kg)  12/18/16 200 lb 8 oz (90.9 kg)  10/28/16 213 lb (96.6 kg)     Lab Results  Component Value Date   HGBA1C 6.9 (H) 12/18/2016   HGBA1C 7.9 (H) 09/03/2016   HGBA1C 6.5 12/26/2015   Lab Results  Component Value Date   MICROALBUR 0.8 12/18/2016   CREATININE 0.95 12/18/2016     General Examination:   BP 138/78   Pulse 83   Ht 5\' 11"  (1.803 m)   Wt 201 lb (91.2 kg)   SpO2 97%   BMI 28.03 kg/m    Assessment/ Plan:  Hypogonadotropic hypogonadism  This is likely to be related to insulin resistance syndrome Although his testosterone level is improved significantly from baseline with clomiphene he has not had any improvement in his fatigue symptoms and unlikely that he is symptomatic from his hypogonadism alone He also did not respond to Wellbutrin, he has stopped taking this recently  However he says he does feel better when he exercises  Recommended that he continue the same dose of clomiphene for now  Follow-up in 4 months   Coco Sharpnack 12/25/2016, 8:43 AM

## 2016-12-28 ENCOUNTER — Encounter: Payer: Self-pay | Admitting: Endocrinology

## 2016-12-28 DIAGNOSIS — G4733 Obstructive sleep apnea (adult) (pediatric): Secondary | ICD-10-CM | POA: Diagnosis not present

## 2016-12-29 ENCOUNTER — Telehealth: Payer: Self-pay | Admitting: Endocrinology

## 2016-12-29 NOTE — Telephone Encounter (Signed)
Patient is requesting to be placed back on wellbutrin. He states that it helps him in other ways- he feels that it makes him " less clumsy" and he "feels better".

## 2016-12-29 NOTE — Telephone Encounter (Signed)
We can send one refill but no more than 30 days, he needs to get this from his PCP as this is not an endocrine problem.  Please let him know that

## 2016-12-29 NOTE — Telephone Encounter (Signed)
See message and please advise, Thanks!  

## 2016-12-30 NOTE — Telephone Encounter (Signed)
It is on the medication history

## 2016-12-30 NOTE — Telephone Encounter (Addendum)
Once or twice daily?

## 2016-12-30 NOTE — Telephone Encounter (Signed)
This medication is not on the current medication list. Please advise on the directions and strength.

## 2016-12-30 NOTE — Telephone Encounter (Signed)
150mg

## 2016-12-30 NOTE — Telephone Encounter (Signed)
There are two different dosage on the medication history. Please advise which is the correct dosage.

## 2016-12-30 NOTE — Telephone Encounter (Signed)
In the morning

## 2016-12-31 MED ORDER — BUPROPION HCL ER (SR) 150 MG PO TB12: ORAL_TABLET | ORAL | 0 refills | Status: DC

## 2016-12-31 NOTE — Telephone Encounter (Signed)
Please advised if the SR or XL should be sent to the pharmacy. This has not been specified yet.

## 2016-12-31 NOTE — Telephone Encounter (Signed)
Rx for Wellbutrin 150 mg SR 1 tab in the morning submitted to [facility] for a 30 day supply. Pateint advised of medication being submitted for a 30 day supply and to contact pcp for there refills.

## 2016-12-31 NOTE — Telephone Encounter (Signed)
Sr as before

## 2017-01-08 DIAGNOSIS — J029 Acute pharyngitis, unspecified: Secondary | ICD-10-CM | POA: Diagnosis not present

## 2017-01-11 ENCOUNTER — Other Ambulatory Visit: Payer: Self-pay | Admitting: Endocrinology

## 2017-01-12 DIAGNOSIS — M542 Cervicalgia: Secondary | ICD-10-CM | POA: Diagnosis not present

## 2017-01-12 DIAGNOSIS — M545 Low back pain: Secondary | ICD-10-CM | POA: Diagnosis not present

## 2017-01-12 DIAGNOSIS — M25571 Pain in right ankle and joints of right foot: Secondary | ICD-10-CM | POA: Diagnosis not present

## 2017-01-14 DIAGNOSIS — M25571 Pain in right ankle and joints of right foot: Secondary | ICD-10-CM | POA: Diagnosis not present

## 2017-01-14 DIAGNOSIS — M545 Low back pain: Secondary | ICD-10-CM | POA: Diagnosis not present

## 2017-01-14 DIAGNOSIS — M542 Cervicalgia: Secondary | ICD-10-CM | POA: Diagnosis not present

## 2017-01-20 DIAGNOSIS — M25571 Pain in right ankle and joints of right foot: Secondary | ICD-10-CM | POA: Diagnosis not present

## 2017-01-20 DIAGNOSIS — M545 Low back pain: Secondary | ICD-10-CM | POA: Diagnosis not present

## 2017-01-20 DIAGNOSIS — M542 Cervicalgia: Secondary | ICD-10-CM | POA: Diagnosis not present

## 2017-01-26 DIAGNOSIS — M542 Cervicalgia: Secondary | ICD-10-CM | POA: Diagnosis not present

## 2017-01-26 DIAGNOSIS — M545 Low back pain: Secondary | ICD-10-CM | POA: Diagnosis not present

## 2017-01-26 DIAGNOSIS — M25571 Pain in right ankle and joints of right foot: Secondary | ICD-10-CM | POA: Diagnosis not present

## 2017-01-29 DIAGNOSIS — G4733 Obstructive sleep apnea (adult) (pediatric): Secondary | ICD-10-CM | POA: Diagnosis not present

## 2017-02-02 DIAGNOSIS — M25571 Pain in right ankle and joints of right foot: Secondary | ICD-10-CM | POA: Diagnosis not present

## 2017-02-02 DIAGNOSIS — M545 Low back pain: Secondary | ICD-10-CM | POA: Diagnosis not present

## 2017-02-02 DIAGNOSIS — M542 Cervicalgia: Secondary | ICD-10-CM | POA: Diagnosis not present

## 2017-02-10 DIAGNOSIS — M25571 Pain in right ankle and joints of right foot: Secondary | ICD-10-CM | POA: Diagnosis not present

## 2017-02-10 DIAGNOSIS — M545 Low back pain: Secondary | ICD-10-CM | POA: Diagnosis not present

## 2017-02-10 DIAGNOSIS — M542 Cervicalgia: Secondary | ICD-10-CM | POA: Diagnosis not present

## 2017-02-22 DIAGNOSIS — M25571 Pain in right ankle and joints of right foot: Secondary | ICD-10-CM | POA: Diagnosis not present

## 2017-02-22 DIAGNOSIS — M542 Cervicalgia: Secondary | ICD-10-CM | POA: Diagnosis not present

## 2017-02-22 DIAGNOSIS — M545 Low back pain: Secondary | ICD-10-CM | POA: Diagnosis not present

## 2017-03-01 DIAGNOSIS — G4733 Obstructive sleep apnea (adult) (pediatric): Secondary | ICD-10-CM | POA: Diagnosis not present

## 2017-03-04 DIAGNOSIS — M542 Cervicalgia: Secondary | ICD-10-CM | POA: Diagnosis not present

## 2017-03-04 DIAGNOSIS — M545 Low back pain: Secondary | ICD-10-CM | POA: Diagnosis not present

## 2017-03-04 DIAGNOSIS — M25571 Pain in right ankle and joints of right foot: Secondary | ICD-10-CM | POA: Diagnosis not present

## 2017-03-08 NOTE — Progress Notes (Deleted)
Patient's Name: Douglas [name]  MRN: [id number]  Referring Provider: Tammi Sou, MD  DOB: Sep 27, 1960  PCP: Tammi Sou, MD  DOS: 03/09/2017  Note by: Gaspar Cola, MD  Service setting: Ambulatory outpatient  Specialty: Interventional Pain Management  Location: [facility] (AMB) Pain Management Facility  Visit type: Initial Patient Evaluation  Patient type: New Patient   Primary Reason(s) for Visit: Encounter for initial evaluation of one or more chronic problems (new to examiner) potentially causing chronic pain, and posing a threat to normal musculoskeletal function. (Level of risk: High) CC: No chief complaint on file.  HPI  Mr. Koral is a 56 y.o. year old, male patient, who comes today to see [place] for the first time for an initial evaluation of his chronic pain. He has Hypogonadism, male; Prostate cancer screening; External hemorrhoids; Acrophobia; Angioedema of lips; Asthma with bronchitis; Rectal bleeding; Internal hemorrhoids, bleeding and discharge; Anal fissure - posterior; Diabetes mellitus without complication ([place]); Orthostatic hypotension; Exercise intolerance; Essential hypertension; and OSA (obstructive sleep apnea) on his problem list. Today he comes in for evaluation of his No chief complaint on file.  Pain Assessment: Location:     Radiating:   Onset:   Duration:   Quality:   Severity:  /10 (self-reported pain score)  Note: Reported level is compatible with observation.                   Effect on ADL:   Timing:   Modifying factors:    Onset and Duration: {Hx; Onset and Duration:210120511} Cause of pain: {Hx; Cause:210120521} Severity: {Pain Severity:210120502} Timing: {Symptoms; Timing:210120501} Aggravating Factors: {Causes; Aggravating pain factors:210120507} Alleviating Factors: {Causes; Alleviating Factors:210120500} Associated Problems: {Hx; Associated problems:210120515} Quality of Pain: {Hx; Symptom quality or Descriptor:210120531} Previous  Examinations or Tests: {Hx; Previous examinations or test:210120529} Previous Treatments: {Hx; Previous Treatment:210120503}  The patient comes into the clinics today for the first time for a chronic pain management evaluation. ***  Today I took the time to provide the patient with information regarding my pain practice. The patient was informed that my practice is divided into two sections: an interventional pain management section, as well as a completely separate and distinct medication management section. I explained that I have procedure days for my interventional therapies, and evaluation days for follow-ups and medication management. Because of the amount of documentation required during both, they are kept separated. This means that there is the possibility that he may be scheduled for a procedure on one day, and medication management the next. I have also informed him that because of staffing and facility limitations, I no longer take patients for medication management only. To illustrate the reasons for this, I gave the patient the example of surgeons, and how inappropriate it would be to refer a patient to his/her care, just to write for the post-surgical antibiotics on a surgery done by a different surgeon.   Because interventional pain management is my board-certified specialty, the patient was informed that joining my practice means that they are open to any and all interventional therapies. I made it clear that this does not mean that they will be forced to have any procedures done. What this means is that I believe interventional therapies to be essential part of the diagnosis and proper management of chronic pain conditions. Therefore, patients not interested in these interventional alternatives will be better served under the care of a different practitioner.  The patient was also made aware of my Comprehensive Pain Management Safety Guidelines  where by joining my practice, they limit all  of their nerve blocks and joint injections to those done by our practice, for as long as we are retained to manage their care.   Historic Controlled Substance Pharmacotherapy Review  PMP and historical list of controlled substances: *** Highest opioid analgesic regimen found: *** Most recent opioid analgesic: *** Current opioid analgesics: *** Highest recorded MME/day: *** mg/day MME/day: *** mg/day Medications: The patient did not bring the medication(s) to the appointment, as requested in our "New Patient Package" Pharmacodynamics: Desired effects: Analgesia: The patient reports >50% benefit. Reported improvement in function: The patient reports medication allows him to accomplish basic ADLs. Clinically meaningful improvement in function (CMIF): Sustained CMIF goals met Perceived effectiveness: Described as relatively effective, allowing for increase in activities of daily living (ADL) Undesirable effects: Side-effects or Adverse reactions: None reported Historical Monitoring: The patient  reports that he does not use drugs. List of all UDS Test(s): No results found for: MDMA, COCAINSCRNUR, PCPSCRNUR, PCPQUANT, CANNABQUANT, THCU, [place] List of all Serum Drug Screening Test(s):  No results found for: AMPHSCRSER, BARBSCRSER, BENZOSCRSER, COCAINSCRSER, PCPSCRSER, PCPQUANT, THCSCRSER, CANNABQUANT, OPIATESCRSER, OXYSCRSER, PROPOXSCRSER Historical Background Evaluation: [place] PDMP: Six (6) year initial data search conducted.             [place] Department of public safety, offender search: Editor, commissioning Information) Non-contributory Risk Assessment Profile: Aberrant behavior: None observed or detected today Risk factors for fatal opioid overdose: None identified today Fatal overdose hazard ratio (HR): Calculation deferred Non-fatal overdose hazard ratio (HR): Calculation deferred Risk of opioid abuse or dependence: 0.7-3.0% with doses ? 36 MME/day and 6.1-26% with doses ? 120 MME/day. Substance use  disorder (SUD) risk level: Pending results of Medical Psychology Evaluation for SUD Opioid risk tool (ORT) (Total Score):    ORT Scoring interpretation table:  Score <3 = Low Risk for SUD  Score between 4-7 = Moderate Risk for SUD  Score >8 = High Risk for Opioid Abuse   PHQ-2 Depression Scale:  Total score:    PHQ-2 Scoring interpretation table: (Score and probability of major depressive disorder)  Score 0 = No depression  Score 1 = 15.4% Probability  Score 2 = 21.1% Probability  Score 3 = 38.4% Probability  Score 4 = 45.5% Probability  Score 5 = 56.4% Probability  Score 6 = 78.6% Probability   PHQ-9 Depression Scale:  Total score:    PHQ-9 Scoring interpretation table:  Score 0-4 = No depression  Score 5-9 = Mild depression  Score 10-14 = Moderate depression  Score 15-19 = Moderately severe depression  Score 20-27 = Severe depression (2.4 times higher risk of SUD and 2.89 times higher risk of overuse)   Pharmacologic Plan: Pending ordered tests and/or consults            Initial impression: Pending review of available data and ordered tests.  Meds   No outpatient prescriptions have been marked as taking for the 03/09/17 encounter (Appointment) with Milinda Pointer, MD.    Imaging Review  Cervical Imaging: Cervical MR wo contrast:  Cervical MR wo contrast:  Cervical MR w/wo contrast:  Cervical MR w contrast:  Cervical CT wo contrast:  Cervical CT w/wo contrast:  Cervical CT w/wo contrast:  Cervical CT w contrast:  Cervical CT outside:  Cervical DG 1 view:  Cervical DG 2-3 views:  Cervical DG F/E views:  Cervical DG 2-3 clearing views:  Cervical DG Bending/F/E views:  Cervical DG complete:  Cervical DG Myelogram views:  Cervical DG  Myelogram views:  Cervical Discogram views:   Shoulder Imaging: Shoulder-R MR w contrast:  Shoulder-L MR w contrast:  Shoulder-R MR w/wo contrast:  Shoulder-L MR w/wo contrast:  Shoulder-R MR wo contrast:  Shoulder-L MR wo  contrast:  Results for orders placed during the hospital encounter of 10/08/10  MR Shoulder Left Wo Contrast   Addendum ***ADDENDUM*** CREATED: 12/12/2010 12:29:32  The posterior superficial soft tissues of the shoulder are only partially visualized on this shoulder MRI.  There appears to be prominent subcutaneous fat overlying the posterior deltoid muscle. There is no definable mass on the available images.  However, most of the superficial posterior soft tissues are not visible on this exam.  ***END ADDENDUM*** SIGNED BY: Nikki Dom. Zigmund Daniel, M.D.      Larey Seat, MD 12/12/2010 12:35 PM         Narrative *RADIOLOGY REPORT*  Clinical Data: Left shoulder pain.  MRI OF THE LEFT SHOULDER WITHOUT CONTRAST  Technique:  Multiplanar, multisequence MR imaging was performed. No intravenous contrast was administered.  Comparison: None  Findings: No acute bony findings.  There are mild AC joint degenerative changes with mild edema like signal abnormality in the distal clavicle and acromion suggesting an ongoing stress-related process.  The acromion is type 2 in shape.  No significant lateral downsloping or undersurface spurring change.  There is moderate rotator cuff tendinopathy/tendinosis.  There is a small full-thickness retracted supraspinatus tendon tear.  This is 12.5 mm wide and there is 12.5 mm of retraction.  The infraspinatus and subscapularis tendons are intact.  Subchondral cystic change noted near the subscapularis attachment site.  The long head biceps tendon is intact.  The glenoid labra are intact.  IMPRESSION:  1.  12.5 mm supraspinatus tendon tear. 2.  AC joint degenerative changes and mild reactive changes. 3.  Intact biceps tendon and glenoid labra.  Original Report Authenticated By: Larey Seat, M.D.   Shoulder-R CT w contrast:  Shoulder-L CT w contrast:  Shoulder-R CT w/wo contrast:  Shoulder-L CT w/wo contrast:  Shoulder-R CT wo contrast:  Shoulder-L CT  wo contrast:  Shoulder-R DG Arthrogram:  Shoulder-L DG Arthrogram:  Shoulder-R DG 1 view:  Shoulder-L DG 1 view:  Shoulder-R DG:  Shoulder-L DG:   Thoracic Imaging: Thoracic MR wo contrast:  Thoracic MR wo contrast:  Thoracic MR w/wo contrast:  Thoracic MR w contrast:  Thoracic CT wo contrast:  Thoracic CT w/wo contrast:  Thoracic CT w/wo contrast:  Thoracic CT w contrast:  Thoracic DG 2-3 views:  Thoracic DG 4 views:  Thoracic DG:  Thoracic DG w/swimmers view:  Thoracic DG Myelogram views:  Thoracic DG Myelogram views:   Lumbosacral Imaging: Lumbar MR wo contrast:  Lumbar MR wo contrast:  Lumbar MR w/wo contrast:  Lumbar MR w contrast:  Lumbar CT wo contrast:  Lumbar CT w/wo contrast:  Lumbar CT w/wo contrast:  Lumbar CT w contrast:  Lumbar DG 1V:  Lumbar DG 1V (Clearing):  Lumbar DG 2-3V (Clearing):  Lumbar DG 2-3 views:  Lumbar DG (Complete) 4+V:  Lumbar DG F/E views:  Lumbar DG Bending views:  Lumbar DG Myelogram views:  Lumbar DG Myelogram:  Lumbar DG Myelogram:  Lumbar DG Myelogram:  Lumbar DG Myelogram Lumbosacral:  Lumbar DG Diskogram views:  Lumbar DG Diskogram views:  Lumbar DG Epidurogram OP:  Lumbar DG Epidurogram IP:   Sacroiliac Joint Imaging: Sacroiliac Joint DG:  Sacroiliac Joint MR w/wo contrast:  Sacroiliac Joint MR wo contrast:   Spine Imaging: Whole Spine  DG Myelogram views:  Whole Spine MR Mets screen:  Whole Spine MR Mets screen:  Whole Spine MR w/wo:  MRA Spinal Canal w/ cm:  MRA Spinal Canal wo/ cm:  MRA Spinal Canal w/wo cm:  Spine Outside MR Films:  Spine Outside CT Films:  CT-Guided Biopsy:  CT-Guided Needle Placement:  DG Spine outside:  IR Spine outside:  NM Spine outside:   Hip Imaging: Hip-R MR w contrast:  Hip-L MR w contrast:  Hip-R MR w/wo contrast:  Hip-L MR w/wo contrast:  Hip-R MR wo contrast:  Hip-L MR wo contrast:  Hip-R CT w contrast:  Hip-L CT w contrast:  Hip-R CT w/wo contrast:  Hip-L CT w/wo  contrast:  Hip-R CT wo contrast:  Hip-L CT wo contrast:  Hip-R DG 2-3 views:  Hip-L DG 2-3 views:  Hip-R DG Arthrogram:  Hip-L DG Arthrogram:  Hip-B DG Bilateral:   Knee Imaging: Knee-R MR w contrast:  Knee-L MR w contrast:  Knee-R MR w/wo contrast:  Knee-L MR w/wo contrast:  Knee-R MR wo contrast:  Knee-L MR wo contrast:  Knee-R CT w contrast:  Knee-L CT w contrast:  Knee-R CT w/wo contrast:  Knee-L CT w/wo contrast:  Knee-R CT wo contrast:  Knee-L CT wo contrast:  Knee-R DG 1-2 views:  Knee-L DG 1-2 views:  Knee-R DG 3 views:  Knee-L DG 3 views:  Knee-R DG 4 views:  Knee-L DG 4 views:  Knee-R DG Arthrogram:  Knee-L DG Arthrogram:   Complexity Note: Imaging results reviewed. Results discussed using Layman's terms.               ROS  Cardiovascular History: {Hx; Cardiovascular History:210120525} Pulmonary or Respiratory History: {Hx; Pumonary and/or Respiratory History:210120523} Neurological History: {Hx; Neurological:210120504} Review of Past Neurological Studies: No results found for this or any previous visit. Psychological-Psychiatric History: {Hx; Psychological-Psychiatric History:210120512} Gastrointestinal History: {Hx; Gastrointestinal:210120527} Genitourinary History: {Hx; Genitourinary:210120506} Hematological History: {Hx; Hematological:210120510} Endocrine History: {Hx; Endocrine history:210120509} Rheumatologic History: {Hx; Rheumatological:210120530} Musculoskeletal History: {Hx; Musculoskeletal:210120528} Work History: {Hx; Work history:210120514}  Allergies  Mr. Tippin is allergic to molds & smuts.  Laboratory Chemistry  Inflammation Markers (CRP: Acute Phase) (ESR: Chronic Phase) Lab Results  Component Value Date   ESRSEDRATE 14 02/23/2012                 Renal Function Markers Lab Results  Component Value Date   BUN 15 12/18/2016   CREATININE 0.95 12/18/2016   GFRAA >60 06/16/2016   GFRNONAA >60 06/16/2016                  Hepatic Function Markers Lab Results  Component Value Date   AST 16 09/03/2016   ALT 31 09/03/2016   ALBUMIN 4.5 09/03/2016   ALKPHOS 58 09/03/2016   HCVAB NEGATIVE 06/03/2015                 Electrolytes Lab Results  Component Value Date   NA 141 12/18/2016   K 4.4 12/18/2016   CL 106 12/18/2016   CALCIUM 9.4 12/18/2016                 Neuropathy Markers No results found for: [id number]               Bone Pathology Markers Lab Results  Component Value Date   ALKPHOS 58 09/03/2016   CALCIUM 9.4 12/18/2016   TESTOFREE 4.5 (L) 09/03/2016   TESTOSTERONE 399.07 12/18/2016  Coagulation Parameters Lab Results  Component Value Date   PLT 289 06/16/2016                 Cardiovascular Markers Lab Results  Component Value Date   HGB 14.8 06/16/2016   HCT 40.7 06/16/2016                 Note: Lab results reviewed.  PFSH  Drug: Mr. Purnell  reports that he does not use drugs. Alcohol:  reports that he drinks alcohol. Tobacco:  reports that he has never smoked. He has never used smokeless tobacco. 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 Family: family history includes CAD in his father; CVA in his mother; Dementia in his father; Diabetes in his father and mother; Heart disease in his father; Hypertension in his father and mother.  Past Surgical History:  Procedure Laterality Date  . adnoids    . CARDIOVASCULAR STRESS TEST     Normal stress nuclear study, EF  normal. +Hypertensive bp response--subsequent 24H ambulatory BP monitoring confirmed HTN.  . COLONOSCOPY  05/2006, 05/2012  . HEMORRHOID BANDING    . POLYPECTOMY     Adenomatous 2007; hyperplastic 2013.  Repeat 5 yrs.  . TRANSTHORACIC ECHOCARDIOGRAM  01/30/16   Normal except mild aortic dilatation  . WISDOM TOOTH EXTRACTION     Active Ambulatory Problems    Diagnosis Date Noted  . Hypogonadism, male 04/08/2011  . Prostate cancer screening 04/24/2011  . External hemorrhoids 09/28/2012  . Acrophobia   . Angioedema of lips 03/13/2013  . Asthma with bronchitis 03/13/2013  . Rectal bleeding 05/29/2013  . Internal hemorrhoids, bleeding and discharge 05/29/2013  . Anal fissure - posterior 11/28/2013  . Diabetes mellitus without complication ([place]) 35/36/1443  . Orthostatic hypotension 03/11/2016  . Exercise intolerance 03/11/2016  . Essential hypertension 04/14/2016  . OSA (obstructive sleep apnea) 04/27/2016   Resolved Ambulatory Problems    Diagnosis Date Noted  . RECTAL BLEEDING 10/23/2009  . Insomnia 04/08/2011  . Lipoma 04/08/2011  . Pre-syncope 09/30/2011  . Angioedema of lips 12/07/2011   Past Medical History:  Diagnosis Date  . Acrophobia   . Adenomatous colon polyp 05/07/06; 05/2012  . Angioedema of lips   . Asthmatic bronchitis , chronic ([place]) 01/11/2012  . Chronic recurrent sinusitis 02/28/2012  . Depression   . Diabetes mellitus without complication ([place])   . Diverticulosis   . Generalized anxiety disorder 08/11/2012  . Hemorrhoids   . Hypertension 2017  . Hypertriglyceridemia   . Hypogonadotropic hypogonadism ([place]) 04/08/2011  . Insomnia   . Internal hemorrhoids, bleeding and sicharge 05/29/2013  . OSA on CPAP   . Rotator cuff tear 2011   Constitutional Exam  General appearance: Well nourished, well developed, and well hydrated. In no apparent acute distress There were no vitals filed for this visit. BMI Assessment: Estimated body mass index is 28.03 kg/m as  calculated from the following:   Height as of 12/25/16: '5\' 11"'$  (1.803 m).   Weight as of 12/25/16: 201 lb (91.2 kg).  BMI interpretation table: BMI level Category Range association with higher incidence of chronic pain  <18 kg/m2 Underweight   18.5-24.9 kg/m2 Ideal body weight  25-29.9 kg/m2 Overweight Increased incidence by 20%  30-34.9 kg/m2 Obese (Class I) Increased incidence by 68%  35-39.9 kg/m2 Severe obesity (Class II) Increased incidence by 136%  >40 kg/m2 Extreme obesity (Class III) Increased incidence by 254%   BMI Readings from Last 4 Encounters:  12/25/16 28.03 kg/m  12/18/16 27.96 kg/m  10/28/16 29.71 kg/m  10/23/16 30.02 kg/m   Wt Readings from Last 4 Encounters:  12/25/16 201 lb (91.2 kg)  12/18/16 200 lb 8 oz (90.9 kg)  10/28/16 213 lb (96.6 kg)  10/23/16 212 lb 3.2 oz (96.3 kg)  Psych/Mental status: Alert, oriented x 3 (person, place, & time)       Eyes: PERLA Respiratory: No evidence of acute respiratory distress  Cervical Spine Exam  Inspection: No masses, redness, or swelling Alignment: Symmetrical Functional ROM: Unrestricted ROM      Stability: No instability detected Muscle strength & Tone: Functionally intact Sensory: Unimpaired Palpation: No palpable anomalies              Upper Extremity (UE) Exam    Side: Right upper extremity  Side: Left upper extremity  Inspection: No masses, redness, swelling, or asymmetry. No contractures  Inspection: No masses, redness, swelling, or asymmetry. No contractures  Functional ROM: Unrestricted ROM          Functional ROM: Unrestricted ROM          Muscle strength & Tone: Functionally intact  Muscle strength & Tone: Functionally intact  Sensory: Unimpaired  Sensory: Unimpaired  Palpation: No palpable anomalies              Palpation: No palpable anomalies              Specialized Test(s): Deferred         Specialized Test(s): Deferred          Thoracic Spine Exam  Inspection: No masses, redness, or  swelling Alignment: Symmetrical Functional ROM: Unrestricted ROM Stability: No instability detected Sensory: Unimpaired Muscle strength & Tone: No palpable anomalies  Lumbar Spine Exam  Inspection: No masses, redness, or swelling Alignment: Symmetrical Functional ROM: Unrestricted ROM      Stability: No instability detected Muscle strength & Tone: Functionally intact Sensory: Unimpaired Palpation: No palpable anomalies       Provocative Tests: Lumbar Hyperextension and rotation test: evaluation deferred today       Patrick's Maneuver: evaluation deferred today                    Gait & Posture Assessment  Ambulation: Unassisted Gait: Relatively normal for age and body habitus Posture: WNL   Lower Extremity Exam    Side: Right lower extremity  Side: Left lower extremity  Inspection: No masses, redness, swelling, or asymmetry. No contractures  Inspection: No masses, redness, swelling, or asymmetry. No contractures  Functional ROM: Unrestricted ROM          Functional ROM: Unrestricted ROM          Muscle strength & Tone: Functionally intact  Muscle strength & Tone: Functionally intact  Sensory: Unimpaired  Sensory: Unimpaired  Palpation: No palpable anomalies  Palpation: No palpable anomalies   Assessment  Primary Diagnosis & Pertinent Problem List: There were no encounter diagnoses.  Visit Diagnosis (New problems to examiner): No diagnosis found. Plan of Care (Initial workup plan)  Note: Please be advised that as per protocol, today's visit has been an evaluation only. We have not taken over the patient's controlled substance management.  Problem-specific plan:  No problem-specific Assessment & Plan notes found for this encounter.  Ordered Lab-work, Procedure(s), Referral(s), & Consult(s): No orders of the defined types were placed in this encounter.  Pharmacotherapy (current): Medications ordered:  No orders of the defined types were placed in this  encounter.  Medications administered during this visit: Mr. Alcock had no medications administered during this visit.   Pharmacological management options:  Opioid Analgesics: The patient was informed that there is no guarantee that he would be a candidate for opioid analgesics. The decision will be made following CDC guidelines. This decision will be based on the results of diagnostic studies, as well as Mr. Braxton's risk profile.   Membrane stabilizer: To be determined at a later time  Muscle relaxant: To be determined at a later time  NSAID: To be determined at a later time  Other analgesic(s): To be determined at a later time   Interventional management options: Mr. Mccurdy was informed that there is no guarantee that he would be a candidate for interventional therapies. The decision will be based on the results of diagnostic studies, as well as Mr. Caul's risk profile.  Procedure(s) under consideration:  ***   Provider-requested follow-up: No Follow-up on file.  Future Appointments Date Time Provider [place]  03/09/2017 10:00 AM Milinda Pointer, MD [facility] None  03/18/2017 9:00 AM McGowen, Adrian Blackwater, MD [facility] None  04/26/2017 8:45 AM Elayne Snare, MD [facility] None    Primary Care Physician: Tammi Sou, MD Location: [facility] Note by: Gaspar Cola, MD Date: 03/09/2017; Time: 3:11 PM  There are no Patient Instructions on file for this visit.

## 2017-03-09 ENCOUNTER — Ambulatory Visit: Payer: Commercial Managed Care - HMO | Admitting: Pain Medicine

## 2017-03-18 ENCOUNTER — Ambulatory Visit (INDEPENDENT_AMBULATORY_CARE_PROVIDER_SITE_OTHER): Payer: 59 | Admitting: Family Medicine

## 2017-03-18 ENCOUNTER — Encounter: Payer: Self-pay | Admitting: Family Medicine

## 2017-03-18 VITALS — BP 126/72 | HR 66 | Temp 98.0°F | Resp 16 | Ht 70.0 in | Wt 205.5 lb

## 2017-03-18 DIAGNOSIS — E781 Pure hyperglyceridemia: Secondary | ICD-10-CM

## 2017-03-18 DIAGNOSIS — Z23 Encounter for immunization: Secondary | ICD-10-CM

## 2017-03-18 DIAGNOSIS — E786 Lipoprotein deficiency: Secondary | ICD-10-CM

## 2017-03-18 DIAGNOSIS — Z0000 Encounter for general adult medical examination without abnormal findings: Secondary | ICD-10-CM | POA: Diagnosis not present

## 2017-03-18 DIAGNOSIS — I10 Essential (primary) hypertension: Secondary | ICD-10-CM | POA: Diagnosis not present

## 2017-03-18 DIAGNOSIS — E119 Type 2 diabetes mellitus without complications: Secondary | ICD-10-CM

## 2017-03-18 DIAGNOSIS — Z125 Encounter for screening for malignant neoplasm of prostate: Secondary | ICD-10-CM | POA: Diagnosis not present

## 2017-03-18 LAB — LDL CHOLESTEROL, DIRECT: Direct LDL: 35 mg/dL

## 2017-03-18 LAB — COMPREHENSIVE METABOLIC PANEL
ALBUMIN: 4.5 g/dL (ref 3.5–5.2)
ALT: 24 U/L (ref 0–53)
AST: 15 U/L (ref 0–37)
Alkaline Phosphatase: 43 U/L (ref 39–117)
BUN: 17 mg/dL (ref 6–23)
CALCIUM: 9.4 mg/dL (ref 8.4–10.5)
CHLORIDE: 105 meq/L (ref 96–112)
CO2: 28 mEq/L (ref 19–32)
CREATININE: 0.89 mg/dL (ref 0.40–1.50)
GFR: 93.33 mL/min (ref >60.00)
Glucose, Bld: 145 mg/dL — ABNORMAL HIGH (ref 70–99)
POTASSIUM: 4.2 meq/L (ref 3.5–5.1)
SODIUM: 139 meq/L (ref 135–145)
TOTAL PROTEIN: 6.9 g/dL (ref 6.0–8.3)
Total Bilirubin: 0.4 mg/dL (ref 0.2–1.2)

## 2017-03-18 LAB — HEMOGLOBIN A1C: HEMOGLOBIN A1C: 7.2 % — AB (ref 4.6–6.5)

## 2017-03-18 LAB — CBC WITH DIFFERENTIAL/PLATELET
BASOS ABS: 0 10*3/uL (ref 0.0–0.1)
BASOS PCT: 0.4 % (ref 0.0–3.0)
Eosinophils Absolute: 0.2 10*3/uL (ref 0.0–0.7)
Eosinophils Relative: 3 % (ref 0.0–5.0)
HEMATOCRIT: 43.7 % (ref 39.0–52.0)
Hemoglobin: 14.4 g/dL (ref 13.0–17.0)
LYMPHS ABS: 2.8 10*3/uL (ref 0.7–4.0)
LYMPHS PCT: 33.9 % (ref 12.0–46.0)
MCHC: 33 g/dL (ref 30.0–36.0)
MCV: 84.5 fl (ref 78.0–100.0)
MONOS PCT: 5.5 % (ref 3.0–12.0)
Monocytes Absolute: 0.4 10*3/uL (ref 0.1–1.0)
NEUTROS ABS: 4.7 10*3/uL (ref 1.4–7.7)
NEUTROS PCT: 57.2 % (ref 43.0–77.0)
PLATELETS: 240 10*3/uL (ref 150.0–400.0)
RBC: 5.17 Mil/uL (ref 4.22–5.81)
RDW: 14.6 % (ref 11.5–15.5)
WBC: 8.1 10*3/uL (ref 4.0–10.5)

## 2017-03-18 LAB — LIPID PANEL
CHOLESTEROL: 156 mg/dL (ref 0–200)
HDL: 27.7 mg/dL — ABNORMAL LOW (ref >39.00)
Total CHOL/HDL Ratio: 6

## 2017-03-18 LAB — TSH: TSH: 1.06 u[IU]/mL (ref 0.35–4.50)

## 2017-03-18 LAB — PSA: PSA: 1.26 ng/mL (ref 0.10–4.00)

## 2017-03-18 MED ORDER — IRBESARTAN 150 MG PO TABS: 150.0000 mg | ORAL_TABLET | Freq: Every day | ORAL | 1 refills | Status: DC

## 2017-03-18 NOTE — Progress Notes (Signed)
Office Note 03/18/2017  CC:  Chief Complaint  Patient presents with  . Annual Exam    Pt is fasting.     HPI:  Douglas [name] is a 56 y.o. White male who is here for annual health maintenance exam.  Home bp monitoring shows avg systolic about 027, avg diast 85.  Eyes: exam in last 1 yr. Dental: preventatives UTD. Exercise: "not yet". Diet: not working on anything except trying to get better at diabetic diet.   Past Medical History:  Diagnosis Date  . 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  . Adenomatous colon polyp 05/07/06; 05/2012   Repeat colonoscopy 05/17/2012 showed 2 small polyps that were removed--path showed Hyperplastic-not adenomatous.  Still needs repeat 5 yrs.  . Angioedema of lips    saw allergist 11/2011  . Asthmatic bronchitis , chronic ([place]) 01/11/2012   Question occupational asthma with no specific agents identified. He does notice that some areas of his job are particularly dusty and dirty. Currently he is well controlled using Symbicort through an AeroChamber and rarely needing his rescue inhaler. This would be an acceptable long-term status. Elevated nonspecific total IgE and peripheral eosinophilia to suggest an atopic trigger with sensitization to something Consider Daliresp for future use if needed   . Chronic recurrent sinusitis 02/28/2012  . Depression    Prozac in the past not much help.  Spontaneously resolved.  . Diabetes mellitus without complication ([place])   . Diverticulosis   . Generalized anxiety disorder 08/11/2012  . Hemorrhoids    ext and int  . Hypertension 2017  . Hypertriglyceridemia    fish oil OTC  . Hypogonadotropic hypogonadism ([place]) 04/08/2011   Felt improved on androgel, but testost levels did no improve.  Several time has been <100 (saw [facility] in 2011 for this, w/u for secondary hypogonadism was unremarkable), most recent testosterone check via  [facility] 10/2010 was 1.15 ng/ml.   As of 08/06/16, Dr. Dwyane Dee ([facility]) is managing this problem: started clomiphene but no better at f/u 11/2016.  Marland Kitchen Insomnia   . Internal hemorrhoids, bleeding and sicharge 05/29/2013  . OSA on CPAP    setting changed to 6 cm H20 (fixed) 10/2016 by pulm.  . Rotator cuff tear 2011   [facility]: conservative mgmt--symptoms stable as of 04/2011.    Past Surgical History:  Procedure Laterality Date  . adnoids    . CARDIOVASCULAR STRESS TEST     Normal stress nuclear study, EF normal. +Hypertensive bp response--subsequent 24H ambulatory BP monitoring confirmed HTN.  . COLONOSCOPY  05/2006, 05/2012  . HEMORRHOID BANDING    . POLYPECTOMY     Adenomatous 2007; hyperplastic 2013.  Repeat 5 yrs.  . TRANSTHORACIC ECHOCARDIOGRAM  01/30/16   Normal except mild aortic dilatation  . WISDOM TOOTH EXTRACTION      Family History  Problem Relation Age of Onset  . Hypertension Mother   . Diabetes Mother   . CVA Mother   . Hypertension Father   . Diabetes Father   . Dementia Father   . Heart disease Father   . CAD Father     Social History   Social History  . Marital status: Married    Spouse name: N/A  . Number of children: 1  . Years of education: N/A   Occupational History  . 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 Smoking status: Never Smoker  .  Smokeless tobacco: Never Used  . Alcohol use 0.0 oz/week     Comment: rarely  . Drug use: No  . Sexual activity: Not on file   Other Topics Concern  . Not on file   Social History Narrative   Married, one 104 y/o daughter, wife is [place].   Grew up in [place] in [place] (four sisters).   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   No T/A/Ds.  Works out regularly.    Outpatient Medications Prior to Visit  Medication Sig Dispense Refill  . budesonide-formoterol (SYMBICORT) 160-4.5 MCG/ACT inhaler Inhale 2 puffs into the lungs 2 (two) times daily. 1  Inhaler 12  . CIALIS 20 MG tablet Take 1 tablet (20 mg total) by mouth daily as needed. Reported on 03/11/2016 10 tablet 12  . clomiPHENE (CLOMID) 50 MG tablet TAKE 1/2 TABLET BY MOUTH ONCE DAILY 20 tablet 2  . glucose blood test strip Check glucose bid 100 each 12  . metFORMIN (GLUCOPHAGE) 1000 MG tablet Take 1 tablet (1,000 mg total) by mouth 2 (two) times daily with a meal. 180 tablet 3  . irbesartan (AVAPRO) 75 MG tablet Take 1 tablet (75 mg total) by mouth daily. 30 tablet 3  . albuterol (VENTOLIN HFA) 108 (90 Base) MCG/ACT inhaler Inhale 1-2 puffs into the lungs every 4 (four) hours as needed for wheezing. (Patient not taking: Reported on 10/23/2016) 1 Inhaler 1  . amLODipine (NORVASC) 10 MG tablet Take 1 tablet (10 mg total) by mouth daily. 90 tablet 3  . buPROPion (WELLBUTRIN SR) 150 MG 12 hr tablet 1 tablet in the morning. (Patient not taking: Reported on 03/18/2017) 30 tablet 0   No facility-administered medications prior to visit.     Allergies  Allergen Reactions  . Molds & Smuts Other (See Comments)    ROS Review of Systems  Constitutional: Negative for appetite change, chills, fatigue and fever.  HENT: Negative for congestion, dental problem, ear pain and sore throat.   Eyes: Negative for discharge, redness and visual disturbance.  Respiratory: Negative for cough, chest tightness, shortness of breath and wheezing.   Cardiovascular: Negative for chest pain, palpitations and leg swelling.  Gastrointestinal: Negative for abdominal pain, blood in stool, diarrhea, nausea and vomiting.  Genitourinary: Negative for difficulty urinating, dysuria, flank pain, frequency, hematuria and urgency.  Musculoskeletal: Negative for arthralgias, back pain, joint swelling, myalgias and neck stiffness.  Skin: Negative for pallor and rash.  Neurological: Negative for dizziness, speech difficulty, weakness and headaches.  Hematological: Negative for adenopathy. Does not bruise/bleed easily.   Psychiatric/Behavioral: Negative for confusion and sleep disturbance. The patient is not nervous/anxious.     PE; Blood pressure 126/72, pulse 66, temperature 98 F (36.7 C), temperature source Oral, resp. rate 16, height 5\' 10"  (1.778 m), weight 205 lb 8 oz (93.2 kg), SpO2 95 %. Body mass index is 29.49 kg/m.  Gen: Alert, well appearing.  Patient is oriented to person, place, time, and situation. AFFECT: pleasant, lucid thought and speech. ENT: Ears: EACs clear, normal epithelium.  TMs with good light reflex and landmarks bilaterally.  Eyes: no injection, icteris, swelling, or exudate.  EOMI, PERRLA. Nose: no drainage or turbinate edema/swelling.  No injection or focal lesion.  Mouth: lips without lesion/swelling.  Oral mucosa pink and moist.  Dentition intact and without obvious caries or gingival swelling.  Oropharynx without erythema, exudate, or swelling.  Neck: supple/nontender.  No LAD, mass, or TM.  Carotid pulses 2+ bilaterally, without  bruits. CV: RRR, no m/r/g.   LUNGS: CTA bilat, nonlabored resps, good aeration in all lung fields. ABD: soft, NT, ND, BS normal.  No hepatospenomegaly or mass.  No bruits. EXT: no clubbing, cyanosis, or edema.  Musculoskeletal: no joint swelling, erythema, warmth, or tenderness.  ROM of all joints intact. Skin - no sores or suspicious lesions or rashes or color changes Rectal exam: negative without mass, lesions or tenderness, PROSTATE EXAM: smooth and symmetric without nodules or tenderness.   Pertinent labs:  Lab Results  Component Value Date   TSH 1.02 09/30/2011   Lab Results  Component Value Date   WBC 8.5 06/16/2016   HGB 14.8 06/16/2016   HCT 40.7 06/16/2016   MCV 82.9 06/16/2016   PLT 289 06/16/2016   Lab Results  Component Value Date   CREATININE 0.95 12/18/2016   BUN 15 12/18/2016   NA 141 12/18/2016   K 4.4 12/18/2016   CL 106 12/18/2016   CO2 27 12/18/2016   Lab Results  Component Value Date   ALT 31 09/03/2016    AST 16 09/03/2016   ALKPHOS 58 09/03/2016   BILITOT 0.4 09/03/2016   Lab Results  Component Value Date   CHOL 134 12/18/2016   Lab Results  Component Value Date   HDL 27.60 (L) 12/18/2016   No results found for: [facility] Lab Results  Component Value Date   TRIG 270.0 (H) 12/18/2016   Lab Results  Component Value Date   CHOLHDL 5 12/18/2016   Lab Results  Component Value Date   PSA 0.89 06/03/2015   PSA 1.06 09/13/2013   Lab Results  Component Value Date   HGBA1C 6.9 (H) 12/18/2016   Lab Results  Component Value Date   MICROALBUR 0.8 12/18/2016    ASSESSMENT AND PLAN:   1) HTN, poor control as per new guidelines. Increase irbesartan to 150 mg qd. Lytes/cr today. Continue home bp monitoring.  2) DM 2; glucoses 110-160. Check HbA1c today.  3) Health maintenance exam: Reviewed age and gender appropriate health maintenance issues (prudent diet, regular exercise, health risks of tobacco and excessive alcohol, use of seatbelts, fire alarms in home, use of sunscreen).  Also reviewed age and gender appropriate health screening as well as vaccine recommendations. Vaccines: UTD.  Shingrix discussed-- #1 given today. Labs: Fasting HP and HbA1c (DM 2). Prostate ca screening: DRE normal today , PSA today. Colon ca screening/hx of adenomatous polyps: recall 05/2017.  An After Visit Summary was printed and given to the patient.  FOLLOW UP:  Return in about 4 months (around 07/19/2017) for routine chronic illness f/u.  Signed:  Crissie Sickles, MD           03/18/2017

## 2017-03-18 NOTE — Patient Instructions (Signed)

## 2017-03-22 ENCOUNTER — Other Ambulatory Visit: Payer: Self-pay | Admitting: *Deleted

## 2017-03-22 DIAGNOSIS — E119 Type 2 diabetes mellitus without complications: Secondary | ICD-10-CM

## 2017-03-22 DIAGNOSIS — E782 Mixed hyperlipidemia: Secondary | ICD-10-CM

## 2017-03-22 MED ORDER — ATORVASTATIN CALCIUM 40 MG PO TABS: 40.0000 mg | ORAL_TABLET | Freq: Every day | ORAL | 3 refills | Status: DC

## 2017-03-22 MED ORDER — PIOGLITAZONE HCL 15 MG PO TABS: 15.0000 mg | ORAL_TABLET | Freq: Every day | ORAL | 3 refills | Status: DC

## 2017-03-31 DIAGNOSIS — G4733 Obstructive sleep apnea (adult) (pediatric): Secondary | ICD-10-CM | POA: Diagnosis not present

## 2017-04-21 ENCOUNTER — Encounter: Payer: Self-pay | Admitting: Gastroenterology

## 2017-04-25 NOTE — Progress Notes (Signed)
Patient ID: Douglas [name], male   DOB: 10/10/1960, 56 y.o.   MRN: [id number]           Chief complaint: Fatigue  Referring physician: Ernestine Conrad   Reason for consultation: Low testosterone level  History of Present Illness  Hypogonadismwas diagnosed in 2009  He was apparently initially seen by his psychiatrist and was having difficulties with fatigue, agoraphobia and decreased libido as well as sexual dysfunction. He was told that his testosterone level was significantly low and was given testosterone supplements probably with AndroGel Subsequently the patient has been treated by various physicians including the urologist with testosterone supplements, mostly AndroGel  He thinks that his testosterone levels have never been normal with AndroGel although he thinks that he felt a little better with taking this from his urologist who called him to apply it on his abdomen He was also tried on monthly injections for about 3 months without consistent benefit also  In 2016 he was given [place] again without any benefit   RECENT history: He had complaints offatigue, decreased motivation, decreased libido and erectile dysfunction on his initial consultation. Initial evaluation showed low free testosterone of 4.5 and normal LH levels and normal prolactin Since he had not had clinical benefit from previous treatments he was told to start CLOMIPHENE after his initial consultation in 09/2015 He is taking 25 mg daily   However he still does not feel any better with the above symptoms and complaining about fatigue and somnolence His testosterone level on his last visit was further improved at about 400 Current levels are pending   His lab results showtestosterone levels as follows:  Lab Results  Component Value Date   TESTOSTERONE 399.07 12/18/2016   TESTOSTERONE 349.53 10/23/2016   TESTOSTERONE 133 (L) 09/03/2016   TESTOSTERONE 228.54 (L) 06/03/2015      Lab Results  Component  Value Date   LH 2.89 09/03/2016    DIABETES: See review of systems      Allergies as of 04/26/2017      Reactions   Molds & Smuts Other (See Comments)      Medication List       Accurate as of 04/26/17  8:46 AM. Always use your most recent med list.          albuterol 108 (90 Base) MCG/ACT inhaler Commonly known as:  VENTOLIN HFA Inhale 1-2 puffs into the lungs every 4 (four) hours as needed for wheezing.   amLODipine 10 MG tablet Commonly known as:  NORVASC Take 1 tablet (10 mg total) by mouth daily.   atorvastatin 40 MG tablet Commonly known as:  LIPITOR Take 1 tablet (40 mg total) by mouth daily.   budesonide-formoterol 160-4.5 MCG/ACT inhaler Commonly known as:  SYMBICORT Inhale 2 puffs into the lungs 2 (two) times daily.   CIALIS 20 MG tablet Generic drug:  tadalafil Take 1 tablet (20 mg total) by mouth daily as needed. Reported on 03/11/2016   clomiPHENE 50 MG tablet Commonly known as:  CLOMID TAKE 1/2 TABLET BY MOUTH ONCE DAILY   glucose blood test strip Check glucose bid   irbesartan 150 MG tablet Commonly known as:  AVAPRO Take 1 tablet (150 mg total) by mouth daily.   metFORMIN 1000 MG tablet Commonly known as:  GLUCOPHAGE Take 1 tablet (1,000 mg total) by mouth 2 (two) times daily with a meal.   pioglitazone 15 MG tablet Commonly known as:  ACTOS Take 1 tablet (15 mg total) by mouth daily.  Allergies:  Allergies  Allergen Reactions  . Molds & Smuts Other (See Comments)    Past Medical History:  Diagnosis Date  . 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  . Adenomatous colon polyp 05/07/06; 05/2012   Repeat colonoscopy 05/17/2012 showed 2 small polyps that were removed--path showed Hyperplastic-not adenomatous.  Still needs repeat 5 yrs.  . Angioedema of lips    saw allergist 11/2011  . Asthmatic bronchitis , chronic ([place]) 01/11/2012   Question occupational  asthma with no specific agents identified. He does notice that some areas of his job are particularly dusty and dirty. Currently he is well controlled using Symbicort through an AeroChamber and rarely needing his rescue inhaler. This would be an acceptable long-term status. Elevated nonspecific total IgE and peripheral eosinophilia to suggest an atopic trigger with sensitization to something Consider Daliresp for future use if needed   . Chronic recurrent sinusitis 02/28/2012  . Depression    Prozac in the past not much help.  Spontaneously resolved.  . Diabetes mellitus without complication ([place])   . Diverticulosis   . Generalized anxiety disorder 08/11/2012  . Hemorrhoids    ext and int  . Hypertension 2017  . Hypertriglyceridemia    with low HDL. Fish oil OTC.  I recommended statin 03/18/17.  Marland Kitchen Hypogonadotropic hypogonadism ([place]) 04/08/2011   Felt improved on androgel, but testost levels did no improve.  Several time has been <100 (saw [facility] in 2011 for this, w/u for secondary hypogonadism was unremarkable), most recent testosterone check via [facility] 10/2010 was 1.15 ng/ml.   As of 08/06/16, Dr. Dwyane Dee ([facility]) is managing this problem: started clomiphene but no better at f/u 11/2016.  Marland Kitchen Insomnia   . Internal hemorrhoids, bleeding and sicharge 05/29/2013  . OSA on CPAP    setting changed to 6 cm H20 (fixed) 10/2016 by pulm.  . Rotator cuff tear 2011   [facility]: conservative mgmt--symptoms stable as of 04/2011.    Past Surgical History:  Procedure Laterality Date  . adnoids    . CARDIOVASCULAR STRESS TEST     Normal stress nuclear study, EF normal. +Hypertensive bp response--subsequent 24H ambulatory BP monitoring confirmed HTN.  . COLONOSCOPY  05/2006, 05/2012  . HEMORRHOID BANDING    . POLYPECTOMY     Adenomatous 2007; hyperplastic 2013.  Repeat 5 yrs.  . TRANSTHORACIC ECHOCARDIOGRAM  01/30/16   Normal except mild aortic dilatation  . WISDOM TOOTH EXTRACTION      Family  History  Problem Relation Age of Onset  . Hypertension Mother   . Diabetes Mother   . CVA Mother   . Hypertension Father   . Diabetes Father   . Dementia Father   . Heart disease Father   . CAD Father     Social History:  reports that he has never smoked. He has never used smokeless tobacco. He reports that he drinks alcohol. He reports that he does not use drugs.  Review of Systems  Respiratory: Positive for daytime sleepiness.   Psychiatric/Behavioral: Positive for insomnia.   DIABETES: He had significant hyperglycemia at onset  His last A1c was 7.2, previously 6.9  He has gained weight on his not exercising and only recently decided to start exercising Taking metformin and recently Actos Has had recent follow-up with PCP when Actos was started However he thinks his blood sugars have been occasionally over 200 since starting the Actos and  he is gaining weight   Wt Readings from Last 3 Encounters:  04/26/17 208 lb 6.4 oz (94.5 kg)  03/18/17 205 lb 8 oz (93.2 kg)  12/25/16 201 lb (91.2 kg)     Lab Results  Component Value Date   HGBA1C 7.2 (H) 03/18/2017   HGBA1C 6.9 (H) 12/18/2016   HGBA1C 7.9 (H) 09/03/2016   Lab Results  Component Value Date   MICROALBUR 0.8 12/18/2016   CREATININE 0.89 03/18/2017    HYPERTENSION: He has had high blood pressure readings at [name] and also appears to have consistently high readings otherwise, currently taking Avapro  Apparently his diastolic blood pressure is still about 77-94 Recently, highest yesterday  General Examination:   BP (!) 152/98   Pulse 68   Ht 5\' 10"  (1.778 m)   Wt 208 lb 6.4 oz (94.5 kg)   SpO2 98%   BMI 29.90 kg/m    Assessment/ Plan:    Hypogonadotropic hypogonadism  Most likely related to insulin resistance syndrome No recent labs available with his continued treatment with clomiphene 25 mg daily  Although his testosterone level previously had come up to about 400 with treatment and has been  significantly low pretreatment he did not feel any better subjectively  However his fatigue is likely to be related to his problems with insomnia and has daytime somnolence which has not been addressed Current testosterone level pending  DIABETES and hypertension:  He is concerned about his blood pressure and blood sugars not being controlled recently  He has some hyperglycemia persisting and because of his continued hypertension he is a good candidate for Invokana This will improve his diabetes control, weight gain, insulin resistance syndrome and blood pressure  Discussed action of SGLT 2 drugs on lowering glucose by decreasing kidney absorption of glucose, benefits of weight loss and lower blood pressure, possible side effects including candidiasis and dosage regimen   He will start with 100 mg daily, patient information given  Follow-up in 4 months  Total visit time for evaluation and management of multiple problems in counseling = 25 minutes  Brittane Grudzinski 04/26/2017, 8:46 AM

## 2017-04-26 ENCOUNTER — Ambulatory Visit (INDEPENDENT_AMBULATORY_CARE_PROVIDER_SITE_OTHER): Payer: 59 | Admitting: Endocrinology

## 2017-04-26 ENCOUNTER — Encounter: Payer: Self-pay | Admitting: Endocrinology

## 2017-04-26 VITALS — BP 152/98 | HR 68 | Ht 70.0 in | Wt 208.4 lb

## 2017-04-26 DIAGNOSIS — I10 Essential (primary) hypertension: Secondary | ICD-10-CM

## 2017-04-26 DIAGNOSIS — E1165 Type 2 diabetes mellitus with hyperglycemia: Secondary | ICD-10-CM

## 2017-04-26 DIAGNOSIS — E291 Testicular hypofunction: Secondary | ICD-10-CM

## 2017-04-26 MED ORDER — CANAGLIFLOZIN 100 MG PO TABS
ORAL_TABLET | ORAL | 3 refills | Status: DC
Start: 2017-04-26 — End: 2017-08-15

## 2017-04-27 ENCOUNTER — Encounter: Payer: Self-pay | Admitting: Family Medicine

## 2017-04-28 ENCOUNTER — Other Ambulatory Visit: Payer: Self-pay | Admitting: Endocrinology

## 2017-04-30 DIAGNOSIS — G4733 Obstructive sleep apnea (adult) (pediatric): Secondary | ICD-10-CM | POA: Diagnosis not present

## 2017-05-06 ENCOUNTER — Encounter: Payer: Self-pay | Admitting: Family Medicine

## 2017-05-11 ENCOUNTER — Telehealth: Payer: Self-pay | Admitting: Pulmonary Disease

## 2017-05-11 NOTE — Telephone Encounter (Signed)
Sleep study has been placed in outgoing mail.  wil close encounter.

## 2017-05-11 NOTE — Telephone Encounter (Signed)
I placed a copy of sleep study up front and called pt to ask if he wanted to pick this up or have it mailed. No answer, left message to call back.

## 2017-05-11 NOTE — Telephone Encounter (Signed)
Pt called back and he said that the Sleep Study could just be mailed to him-tr

## 2017-05-12 ENCOUNTER — Other Ambulatory Visit: Payer: Self-pay | Admitting: Cardiology

## 2017-05-18 ENCOUNTER — Other Ambulatory Visit: Payer: Self-pay | Admitting: Cardiology

## 2017-05-30 DIAGNOSIS — G4733 Obstructive sleep apnea (adult) (pediatric): Secondary | ICD-10-CM | POA: Diagnosis not present

## 2017-06-10 ENCOUNTER — Other Ambulatory Visit: Payer: Self-pay | Admitting: Cardiology

## 2017-06-10 MED ORDER — AMLODIPINE BESYLATE 10 MG PO TABS: 10.0000 mg | ORAL_TABLET | Freq: Every day | ORAL | 0 refills | Status: DC

## 2017-06-11 ENCOUNTER — Other Ambulatory Visit: Payer: Self-pay | Admitting: *Deleted

## 2017-06-11 MED ORDER — ATORVASTATIN CALCIUM 40 MG PO TABS: 40.0000 mg | ORAL_TABLET | Freq: Every day | ORAL | 1 refills | Status: DC

## 2017-06-11 MED ORDER — PIOGLITAZONE HCL 15 MG PO TABS: 15.0000 mg | ORAL_TABLET | Freq: Every day | ORAL | 1 refills | Status: DC

## 2017-06-11 NOTE — Telephone Encounter (Signed)
[facility] OR requesting 90 day supply.

## 2017-06-29 DIAGNOSIS — G4733 Obstructive sleep apnea (adult) (pediatric): Secondary | ICD-10-CM | POA: Diagnosis not present

## 2017-07-19 ENCOUNTER — Encounter: Payer: Self-pay | Admitting: Family Medicine

## 2017-07-19 ENCOUNTER — Other Ambulatory Visit: Payer: Self-pay

## 2017-07-19 ENCOUNTER — Ambulatory Visit: Payer: 59 | Admitting: Family Medicine

## 2017-07-19 VITALS — BP 119/76 | HR 80 | Temp 98.0°F | Resp 16 | Ht 70.0 in | Wt 208.5 lb

## 2017-07-19 DIAGNOSIS — E119 Type 2 diabetes mellitus without complications: Secondary | ICD-10-CM

## 2017-07-19 DIAGNOSIS — E781 Pure hyperglyceridemia: Secondary | ICD-10-CM

## 2017-07-19 DIAGNOSIS — I10 Essential (primary) hypertension: Secondary | ICD-10-CM | POA: Diagnosis not present

## 2017-07-19 DIAGNOSIS — Z23 Encounter for immunization: Secondary | ICD-10-CM | POA: Diagnosis not present

## 2017-07-19 DIAGNOSIS — E782 Mixed hyperlipidemia: Secondary | ICD-10-CM

## 2017-07-19 NOTE — Progress Notes (Signed)
OFFICE VISIT  07/19/2017   CC:  Chief Complaint  Patient presents with  . Follow-up    RCI, pt is fasting.     HPI:    Patient is a 56 y.o. Caucasian male who presents for f/u DM 2, hypertriglyceridemia, and HTN. I added pioglit 15mg  qd last f/u visit as well as atorva 40mg  qd.  DM 2: 130-150 2-4 hours after a meal.  Lowest 100-120. "not dieting like I used to".  No formal exercise.  HTN: 120s/70s.  HLD: tolerating atorvastatin well.  ROS: mild chronic fatigue.  NO CP, no SOB.  NO melena or hematochezia, no abd pain.  No n/v/d.  No myalgias.  Past Medical History:  Diagnosis Date  . 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  . Adenomatous colon polyp 05/07/06; 05/2012   Repeat colonoscopy 05/17/2012 showed 2 small polyps that were removed--path showed Hyperplastic-not adenomatous.  Still needs repeat 5 yrs.  . Angioedema of lips    saw allergist 11/2011  . Asthmatic bronchitis , chronic ([place]) 01/11/2012   Question occupational asthma with no specific agents identified. He does notice that some areas of his job are particularly dusty and dirty. Currently he is well controlled using Symbicort through an AeroChamber and rarely needing his rescue inhaler. This would be an acceptable long-term status. Elevated nonspecific total IgE and peripheral eosinophilia to suggest an atopic trigger with sensitization to something Consider Daliresp for future use if needed   . Chronic fatigue    +daytime somnolence  . Chronic recurrent sinusitis 02/28/2012  . Depression    Prozac in the past not much help.  Spontaneously resolved.  . Diabetes mellitus without complication ([place])    managed by Dr. Dwyane Dee ([facility])  . Diverticulosis   . Generalized anxiety disorder 08/11/2012  . Hemorrhoids    ext and int  . Hypertension 2017  . Hypertriglyceridemia    with low HDL. Fish oil OTC.  I recommended statin 03/18/17.  Marland Kitchen  Hypogonadotropic hypogonadism ([place]) 04/08/2011   Felt improved on androgel, but testost levels did no improve.  Several time has been <100 (saw [facility] in 2011 for this, w/u for secondary hypogonadism was unremarkable), most recent testosterone check via [facility] 10/2010 was 1.15 ng/ml.   As of 08/06/16, Dr. Dwyane Dee ([facility]) is managing this problem: started clomiphene but no better at f/u 11/2016.  Pt denied any improvemt,though testost impro  . Insomnia   . Internal hemorrhoids, bleeding and sicharge 05/29/2013  . OSA on CPAP    setting changed to 6 cm H20 (fixed) 10/2016 by pulm.  . Rotator cuff tear 2011   [facility]: conservative mgmt--symptoms stable as of 04/2011.    Past Surgical History:  Procedure Laterality Date  . adnoids    . CARDIOVASCULAR STRESS TEST     Normal stress nuclear study, EF normal. +Hypertensive bp response--subsequent 24H ambulatory BP monitoring confirmed HTN.  . COLONOSCOPY  05/2006, 05/2012  . HEMORRHOID BANDING    . POLYPECTOMY     Adenomatous 2007; hyperplastic 2013.  Repeat 5 yrs.  . TRANSTHORACIC ECHOCARDIOGRAM  01/30/16   Normal except mild aortic dilatation  . WISDOM TOOTH EXTRACTION      Outpatient Medications Prior to Visit  Medication Sig Dispense Refill  . amLODipine (NORVASC) 10 MG tablet Take 1 tablet (10 mg total) by mouth daily. 15 tablet 0  . atorvastatin (LIPITOR) 40 MG tablet Take 1 tablet (  40 mg total) by mouth daily. 90 tablet 1  . budesonide-formoterol (SYMBICORT) 160-4.5 MCG/ACT inhaler Inhale 2 puffs into the lungs 2 (two) times daily. 1 Inhaler 12  . canagliflozin (INVOKANA) 100 MG TABS tablet 1 tablet before breakfast 30 tablet 3  . CIALIS 20 MG tablet Take 1 tablet (20 mg total) by mouth daily as needed. Reported on 03/11/2016 10 tablet 12  . clomiPHENE (CLOMID) 50 MG tablet TAKE 1/2 TABLET BY MOUTH ONCE DAILY 20 tablet 2  . glucose blood test strip Check glucose bid 100 each 12  . irbesartan (AVAPRO) 150 MG tablet Take 1 tablet  (150 mg total) by mouth daily. 90 tablet 1  . metFORMIN (GLUCOPHAGE) 1000 MG tablet Take 1 tablet (1,000 mg total) by mouth 2 (two) times daily with a meal. 180 tablet 3  . pioglitazone (ACTOS) 15 MG tablet Take 1 tablet (15 mg total) by mouth daily. 90 tablet 1  . albuterol (VENTOLIN HFA) 108 (90 Base) MCG/ACT inhaler Inhale 1-2 puffs into the lungs every 4 (four) hours as needed for wheezing. (Patient not taking: Reported on 10/23/2016) 1 Inhaler 1   No facility-administered medications prior to visit.     Allergies  Allergen Reactions  . Molds & Smuts Other (See Comments)    ROS As per HPI  PE: Blood pressure 119/76, pulse 80, temperature 98 F (36.7 C), temperature source Oral, resp. rate 16, height 5\' 10"  (1.778 m), weight 208 lb 8 oz (94.6 kg), SpO2 94 %. Gen: Alert, well appearing.  Patient is oriented to person, place, time, and situation. AFFECT: pleasant, lucid thought and speech. CV: RRR, no m/r/g.   LUNGS: CTA bilat, nonlabored resps, good aeration in all lung fields. EXT: no clubbing, cyanosis, or edema.   LABS:  Lab Results  Component Value Date   TSH 1.06 03/18/2017   Lab Results  Component Value Date   WBC 8.1 03/18/2017   HGB 14.4 03/18/2017   HCT 43.7 03/18/2017   MCV 84.5 03/18/2017   PLT 240.0 03/18/2017   Lab Results  Component Value Date   CREATININE 0.89 03/18/2017   BUN 17 03/18/2017   NA 139 03/18/2017   K 4.2 03/18/2017   CL 105 03/18/2017   CO2 28 03/18/2017   Lab Results  Component Value Date   ALT 24 03/18/2017   AST 15 03/18/2017   ALKPHOS 43 03/18/2017   BILITOT 0.4 03/18/2017   Lab Results  Component Value Date   CHOL 156 03/18/2017   Lab Results  Component Value Date   HDL 27.70 (L) 03/18/2017   No results found for: [facility] Lab Results  Component Value Date   TRIG (H) 03/18/2017    609.0 Triglyceride is over 400; calculations on Lipids are invalid.   Lab Results  Component Value Date   CHOLHDL 6 03/18/2017   Lab  Results  Component Value Date   PSA 1.26 03/18/2017   PSA 0.89 06/03/2015   PSA 1.06 09/13/2013   Lab Results  Component Value Date   HGBA1C 7.2 (H) 03/18/2017    IMPRESSION AND PLAN:  1) DM 2: home glucoses good, but not much monitoring being done. HbA1c today.  2) HTN: good control.  He says he is on a third bp med through his cardiologist, Dr. Radford Pax, that is about to run out of but he can't recall the name of it---he'll call their office for refill and call us to update this med to [place] so we can get it in his  chart. Lytes/cr today.  3) Hypertrig in diabetic: atorva started last visit, recheck FLP today.   4) Prev health care: Flu vaccine today + Shingrix #2.  An After Visit Summary was printed and given to the patient.  FOLLOW UP: No Follow-up on file.  Signed:  Crissie Sickles, MD           07/19/2017

## 2017-07-20 ENCOUNTER — Other Ambulatory Visit (INDEPENDENT_AMBULATORY_CARE_PROVIDER_SITE_OTHER): Payer: 59

## 2017-07-20 ENCOUNTER — Encounter: Payer: Self-pay | Admitting: Family Medicine

## 2017-07-20 DIAGNOSIS — I10 Essential (primary) hypertension: Secondary | ICD-10-CM

## 2017-07-20 DIAGNOSIS — E119 Type 2 diabetes mellitus without complications: Secondary | ICD-10-CM | POA: Diagnosis not present

## 2017-07-20 DIAGNOSIS — E782 Mixed hyperlipidemia: Secondary | ICD-10-CM

## 2017-07-20 LAB — BASIC METABOLIC PANEL
BUN: 14 mg/dL (ref 6–23)
CALCIUM: 9.5 mg/dL (ref 8.4–10.5)
CO2: 28 mEq/L (ref 19–32)
CREATININE: 0.87 mg/dL (ref 0.40–1.50)
Chloride: 102 mEq/L (ref 96–112)
GFR: 95.7 mL/min (ref >60.00)
GLUCOSE: 136 mg/dL — AB (ref 70–99)
Potassium: 4.1 mEq/L (ref 3.5–5.1)
Sodium: 139 mEq/L (ref 135–145)

## 2017-07-20 LAB — LIPID PANEL
CHOL/HDL RATIO: 4
CHOLESTEROL: 107 mg/dL (ref 0–200)
HDL: 24.9 mg/dL — AB (ref >39.00)
NONHDL: 82.14
TRIGLYCERIDES: 338 mg/dL — AB (ref 0.0–149.0)
VLDL: 67.6 mg/dL — ABNORMAL HIGH (ref 0.0–40.0)

## 2017-07-20 LAB — HEMOGLOBIN A1C: Hgb A1c MFr Bld: 7.8 % — ABNORMAL HIGH (ref 4.6–6.5)

## 2017-07-20 LAB — LDL CHOLESTEROL, DIRECT: Direct LDL: 36 mg/dL

## 2017-07-27 ENCOUNTER — Ambulatory Visit: Payer: 59 | Admitting: Endocrinology

## 2017-07-27 ENCOUNTER — Encounter: Payer: Self-pay | Admitting: Endocrinology

## 2017-07-27 VITALS — BP 140/86 | HR 76 | Ht 70.0 in | Wt 207.4 lb

## 2017-07-27 DIAGNOSIS — E1165 Type 2 diabetes mellitus with hyperglycemia: Secondary | ICD-10-CM

## 2017-07-27 MED ORDER — SEMAGLUTIDE(0.25 OR 0.5MG/DOS) 2 MG/1.5ML ~~LOC~~ SOPN: 0.5000 mg | PEN_INJECTOR | SUBCUTANEOUS | 2 refills | Status: DC

## 2017-07-27 NOTE — Progress Notes (Signed)
Patient ID: Douglas [name], male   DOB: [birth year], 58 y.o.   MRN: [id number]           Chief complaint: Fatigue  Referring physician: Ernestine Conrad   History of Present Illness  PROBLEM 1:  Hypogonadismwas diagnosed in 2009  He was apparently initially seen by his psychiatrist and was having difficulties with fatigue, agoraphobia and decreased libido as well as sexual dysfunction. He was told that his testosterone level was significantly low and was given testosterone supplements probably with AndroGel Subsequently the patient has been treated by various physicians including the urologist with testosterone supplements, mostly AndroGel He thinks that his testosterone levels have never been normal with AndroGel although he thinks that he felt a little better with taking this from his urologist who called him to apply it on his abdomen He was also tried on monthly injections for about 3 months without consistent benefit also In 2016 he was given [place] again without any benefit   RECENT history: He had complaints offatigue, decreased motivation, decreased libido and erectile dysfunction on his initial consultation. Initial evaluation showed low free testosterone of 4.5 and normal LH has been on clomiphene after his initial consultation in 09/2015 He is taking 25 mg daily   However he still does not feel any better with the above symptoms and complaining about fatigue and decreased libido This is despite his testosterone level being much improved up to about 400 in April No recent labs available    His lab results showtestosterone levels as follows:  Lab Results  Component Value Date   TESTOSTERONE 399.07 12/18/2016   TESTOSTERONE 349.53 10/23/2016   TESTOSTERONE 133 (L) 09/03/2016   TESTOSTERONE 228.54 (L) 06/03/2015      Lab Results  Component Value Date   LH 2.89 09/03/2016     DIABETES: See review of systems      Allergies as of 07/27/2017      Reactions    Molds & Smuts Other (See Comments)      Medication List        Accurate as of 07/27/17  9:23 AM. Always use your most recent med list.          albuterol 108 (90 Base) MCG/ACT inhaler Commonly known as:  VENTOLIN HFA Inhale 1-2 puffs into the lungs every 4 (four) hours as needed for wheezing.   amLODipine 10 MG tablet Commonly known as:  NORVASC Take 1 tablet (10 mg total) by mouth daily.   atorvastatin 40 MG tablet Commonly known as:  LIPITOR Take 1 tablet (40 mg total) by mouth daily.   budesonide-formoterol 160-4.5 MCG/ACT inhaler Commonly known as:  SYMBICORT Inhale 2 puffs into the lungs 2 (two) times daily.   canagliflozin 100 MG Tabs tablet Commonly known as:  INVOKANA 1 tablet before breakfast   CIALIS 20 MG tablet Generic drug:  tadalafil Take 1 tablet (20 mg total) by mouth daily as needed. Reported on 03/11/2016   clomiPHENE 50 MG tablet Commonly known as:  CLOMID TAKE 1/2 TABLET BY MOUTH ONCE DAILY   glucose blood test strip Check glucose bid   irbesartan 150 MG tablet Commonly known as:  AVAPRO Take 1 tablet (150 mg total) by mouth daily.   metFORMIN 1000 MG tablet Commonly known as:  GLUCOPHAGE Take 1 tablet (1,000 mg total) by mouth 2 (two) times daily with a meal.   pioglitazone 15 MG tablet Commonly known as:  ACTOS Take 1 tablet (15 mg total) by mouth daily.  Allergies:  Allergies  Allergen Reactions  . Molds & Smuts Other (See Comments)    Past Medical History:  Diagnosis Date  . 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  . Adenomatous colon polyp 05/07/06; 05/2012   Repeat colonoscopy 05/17/2012 showed 2 small polyps that were removed--path showed Hyperplastic-not adenomatous.  Still needs repeat 5 yrs.  . Angioedema of lips    saw allergist 11/2011  . Asthmatic bronchitis , chronic ([place]) 01/11/2012   Question occupational asthma with no specific agents  identified. He does notice that some areas of his job are particularly dusty and dirty. Currently he is well controlled using Symbicort through an AeroChamber and rarely needing his rescue inhaler. This would be an acceptable long-term status. Elevated nonspecific total IgE and peripheral eosinophilia to suggest an atopic trigger with sensitization to something Consider Daliresp for future use if needed   . Chronic fatigue    +daytime somnolence  . Chronic recurrent sinusitis 02/28/2012  . Depression    Prozac in the past not much help.  Spontaneously resolved.  . Diabetes mellitus without complication ([place])    managed by Dr. Dwyane Dee ([facility])  . Diverticulosis   . Generalized anxiety disorder 08/11/2012  . Hemorrhoids    ext and int  . Hyperlipemia, mixed    elev trigs with low HDL. Fish oil OTC.  Statin started in Fall 2018.  Marland Kitchen Hypertension 2017  . Hypogonadotropic hypogonadism ([place]) 04/08/2011   Felt improved on androgel, but testost levels did no improve.  Several time has been <100 (saw [facility] in 2011 for this, w/u for secondary hypogonadism was unremarkable), most recent testosterone check via [facility] 10/2010 was 1.15 ng/ml.   As of 08/06/16, Dr. Dwyane Dee ([facility]) is managing this problem: started clomiphene but no better at f/u 11/2016.  Pt denied any improvemt,though testost impro  . Insomnia   . Internal hemorrhoids, bleeding and sicharge 05/29/2013  . OSA on CPAP    setting changed to 6 cm H20 (fixed) 10/2016 by pulm.  . Rotator cuff tear 2011   [facility]: conservative mgmt--symptoms stable as of 04/2011.    Past Surgical History:  Procedure Laterality Date  . adnoids    . CARDIOVASCULAR STRESS TEST     Normal stress nuclear study, EF normal. +Hypertensive bp response--subsequent 24H ambulatory BP monitoring confirmed HTN.  . COLONOSCOPY  05/2006, 05/2012  . HEMORRHOID BANDING    . POLYPECTOMY     Adenomatous 2007; hyperplastic 2013.  Repeat 5 yrs.  . TRANSTHORACIC  ECHOCARDIOGRAM  01/30/16   Normal except mild aortic dilatation  . WISDOM TOOTH EXTRACTION      Family History  Problem Relation Age of Onset  . Hypertension Mother   . Diabetes Mother   . CVA Mother   . Hypertension Father   . Diabetes Father   . Dementia Father   . Heart disease Father   . CAD Father     Social History:  reports that  has never smoked. he has never used smokeless tobacco. He reports that he drinks alcohol. He reports that he does not use drugs.  Review of Systems  Respiratory: Positive for daytime sleepiness.   Psychiatric/Behavioral: Positive for insomnia.   DIABETES: He had significant hyperglycemia at onset with A1c about 13  Since he was not benefiting from Actos and metformin given by his PCP he was given Invokana in addition in August However his  A1c is higher at 7.8  Current management, problems identified and blood sugar patterns:  He checks blood sugars only in the mornings now and these are about 130-150, lab glucose 136  He says he is knowledgeable about the diet but does not feel motivated to watch this  Also not able to exercise much because of fatigability  His last consultation with dietitian was in 2016  Previously when he will check his postprandial blood sugar there would be over 200 at times  However with taking Invokana his weight has leveled off    Wt Readings from Last 3 Encounters:  07/27/17 207 lb 6.4 oz (94.1 kg)  07/19/17 208 lb 8 oz (94.6 kg)  04/26/17 208 lb 6.4 oz (94.5 kg)     Lab Results  Component Value Date   HGBA1C 7.8 (H) 07/20/2017   HGBA1C 7.2 (H) 03/18/2017   HGBA1C 6.9 (H) 12/18/2016   Lab Results  Component Value Date   MICROALBUR 0.8 12/18/2016   CREATININE 0.87 07/20/2017     HYPERTENSION:  currently managed by his PCP His blood pressure was significant only higher on his consultation in August when he was started on [place] BP 130/80s  General Examination:   BP 140/86   Pulse 76    Ht 5\' 10"  (1.778 m)   Wt 207 lb 6.4 oz (94.1 kg)   SpO2 96%   BMI 29.76 kg/m    Assessment/ Plan:    Hypogonadotropic hypogonadism  Most likely related to insulin resistance syndrome No recent testosterone levels  available with his continued treatment with clomiphene 25 mg daily  Although his testosterone level previously had come up to about 400 with treatment this did not help him subjectively Also complaining about decreased libido  Most likely his fatigue is related to unrelated factors such as depression and difficulty with adequate sleep, needs to discuss this with his PCP in detail  Recommendations: Since he will probably get better libido with taking AndroGel we will switch his clomiphene to this product and discussed how this would be applied Will need to recheck testosterone level in 6 weeks   DIABETES type 2:  Poorly controlled partly because of inadequate diet and exercise Not responding to Invokana, currently with a 3 drug therapy his A1c is still 7.8 Although he can do better with his diet he is not very motivated  He has likely significant persistent postprandial hyperglycemia which he is not monitoring Also has had limited capacity to exercise and decreased motivation also  Discussed with the patient the nature of GLP-1 drugs, the action on various organ systems, how they benefit blood glucose control, as well as the benefit of weight loss and  increase satiety . Explained possible side effects, particularly nausea and vomiting that usually resolve over time; discussed safety information in package insert.  Demonstrated the medication injection device and injection technique to the patient.  Showed patient where to inject the medication. To start with 0.25 mg dosage weekly for the first 4 weeks Patient brochure on [place] with enclosed co-pay card given  HYPERTENSION: Continue follow-up with PCP  FATIGUE, insomnia and decreased physical capacity: He will  need to discuss with PCP for further evaluation, not related to endocrine causes  Follow-up in  6 weeks   Total visit time for evaluation and management of multiple problems in counseling = 25 minutes  Bonham Zingale 07/27/2017, 9:23 AM

## 2017-07-27 NOTE — Patient Instructions (Addendum)
Check blood sugars on waking up  2-3/7  Also check blood sugars about 2 hours after a meal and do this after different meals by rotation  Recommended blood sugar levels on waking up is 90-130 and about 2 hours after meal is 130-160  Please bring your blood sugar monitor to each visit, thank you  Start Ozempic with the pen as shown once weekly on the same day of the week.  You may inject in the stomach, thigh or arm as indicated in the brochure given.  You will feel fullness of the stomach with starting the medication and should try to keep the portions at meals small.  You may experience nausea in the first few days which usually gets better over time

## 2017-07-29 ENCOUNTER — Other Ambulatory Visit: Payer: Self-pay | Admitting: Cardiology

## 2017-07-29 DIAGNOSIS — G4733 Obstructive sleep apnea (adult) (pediatric): Secondary | ICD-10-CM | POA: Diagnosis not present

## 2017-07-29 MED ORDER — AMLODIPINE BESYLATE 10 MG PO TABS: 10.0000 mg | ORAL_TABLET | Freq: Every day | ORAL | 0 refills | Status: DC

## 2017-08-13 ENCOUNTER — Encounter: Payer: Self-pay | Admitting: Pulmonary Disease

## 2017-08-13 ENCOUNTER — Encounter: Payer: Self-pay | Admitting: Endocrinology

## 2017-08-15 ENCOUNTER — Other Ambulatory Visit: Payer: Self-pay | Admitting: Endocrinology

## 2017-08-18 ENCOUNTER — Other Ambulatory Visit: Payer: Self-pay | Admitting: Endocrinology

## 2017-08-28 DIAGNOSIS — G4733 Obstructive sleep apnea (adult) (pediatric): Secondary | ICD-10-CM | POA: Diagnosis not present

## 2017-09-03 ENCOUNTER — Other Ambulatory Visit (INDEPENDENT_AMBULATORY_CARE_PROVIDER_SITE_OTHER): Payer: 59

## 2017-09-03 DIAGNOSIS — R5383 Other fatigue: Secondary | ICD-10-CM | POA: Diagnosis not present

## 2017-09-03 DIAGNOSIS — E291 Testicular hypofunction: Secondary | ICD-10-CM

## 2017-09-03 DIAGNOSIS — E1165 Type 2 diabetes mellitus with hyperglycemia: Secondary | ICD-10-CM | POA: Diagnosis not present

## 2017-09-03 LAB — T4, FREE: Free T4: 0.74 ng/dL (ref 0.60–1.60)

## 2017-09-03 LAB — BASIC METABOLIC PANEL
BUN: 14 mg/dL (ref 6–23)
CALCIUM: 9 mg/dL (ref 8.4–10.5)
CHLORIDE: 106 meq/L (ref 96–112)
CO2: 27 meq/L (ref 19–32)
CREATININE: 0.9 mg/dL (ref 0.40–1.50)
GFR: 91.98 mL/min (ref >60.00)
GLUCOSE: 98 mg/dL (ref 70–99)
Potassium: 4.4 mEq/L (ref 3.5–5.1)
SODIUM: 141 meq/L (ref 135–145)

## 2017-09-03 LAB — TESTOSTERONE: Testosterone: 360.82 ng/dL (ref 300.00–890.00)

## 2017-09-04 LAB — FRUCTOSAMINE: Fructosamine: 234 umol/L (ref 0–285)

## 2017-09-08 ENCOUNTER — Other Ambulatory Visit: Payer: 59

## 2017-09-08 ENCOUNTER — Encounter: Payer: Self-pay | Admitting: Endocrinology

## 2017-09-08 ENCOUNTER — Ambulatory Visit: Payer: 59 | Admitting: Endocrinology

## 2017-09-08 VITALS — BP 126/74 | HR 80 | Ht 70.0 in | Wt 201.0 lb

## 2017-09-08 DIAGNOSIS — E119 Type 2 diabetes mellitus without complications: Secondary | ICD-10-CM

## 2017-09-08 DIAGNOSIS — E291 Testicular hypofunction: Secondary | ICD-10-CM | POA: Diagnosis not present

## 2017-09-08 NOTE — Progress Notes (Signed)
Patient ID: Douglas [name], male   DOB: December 06, 1960, 56 y.o.   MRN: [id number]           Chief complaint: Endocrinology follow-up  Referring physician: McGowan   History of Present Illness  PROBLEM 1:  Hypogonadismwas diagnosed in 2009  He was apparently initially seen by his psychiatrist and was having difficulties with fatigue, agoraphobia and decreased libido as well as sexual dysfunction. He was told that his testosterone level was significantly low and was given testosterone supplements probably with AndroGel Subsequently the patient has been treated by various physicians including the urologist with testosterone supplements, mostly AndroGel He thinks that his testosterone levels have never been normal with AndroGel although he thinks that he felt a little better with taking this from his urologist who called him to apply it on his abdomen He was also tried on monthly injections for about 3 months without consistent benefit also In 2016 he was given [place] again without any benefit   RECENT history: He had complaints offatigue, decreased motivation, decreased libido and erectile dysfunction on his initial consultation. Initial evaluation showed low free testosterone of 4.5 and normal LH  He has been on clomiphene after his initial consultation in 09/2015 He is taking 25 mg daily   He was recommended starting AndroGel on his last visit in November but he did not start this, did not ask for prescription  However he thinks he is feeling better with his energy level recently and appears to be more motivated Also libido is not decreased as much as before   His lab results showtestosterone levels as follows:  Lab Results  Component Value Date   TESTOSTERONE 360.82 09/03/2017   TESTOSTERONE 399.07 12/18/2016   TESTOSTERONE 349.53 10/23/2016   TESTOSTERONE 133 (L) 09/03/2016      Lab Results  Component Value Date   LH 2.89 09/03/2016     DIABETES: See review of  systems      Allergies as of 09/08/2017      Reactions   Molds & Smuts Other (See Comments)      Medication List        Accurate as of 09/08/17  9:01 AM. Always use your most recent med list.          albuterol 108 (90 Base) MCG/ACT inhaler Commonly known as:  VENTOLIN HFA Inhale 1-2 puffs into the lungs every 4 (four) hours as needed for wheezing.   amLODipine 10 MG tablet Commonly known as:  NORVASC Take 1 tablet (10 mg total) by mouth daily. Please make overdue yearly appt with Dr. Radford Pax before anymore refills. 3rd attempt   atorvastatin 40 MG tablet Commonly known as:  LIPITOR Take 1 tablet (40 mg total) by mouth daily.   budesonide-formoterol 160-4.5 MCG/ACT inhaler Commonly known as:  SYMBICORT Inhale 2 puffs into the lungs 2 (two) times daily.   CIALIS 20 MG tablet Generic drug:  tadalafil Take 1 tablet (20 mg total) by mouth daily as needed. Reported on 03/11/2016   clomiPHENE 50 MG tablet Commonly known as:  CLOMID TAKE 1/2 TABLET BY MOUTH ONCE DAILY   glucose blood test strip Check glucose bid   INVOKANA 100 MG Tabs tablet Generic drug:  canagliflozin TAKE 1 TABLET BY MOUTH BEFORE BREAKFAST   irbesartan 150 MG tablet Commonly known as:  AVAPRO Take 1 tablet (150 mg total) by mouth daily.   metFORMIN 1000 MG tablet Commonly known as:  GLUCOPHAGE Take 1 tablet (1,000 mg total) by mouth 2 (two)  times daily with a meal.   pioglitazone 15 MG tablet Commonly known as:  ACTOS Take 1 tablet (15 mg total) by mouth daily.   Semaglutide 0.25 or 0.5 MG/DOSE Sopn Commonly known as:  OZEMPIC Inject 0.5 mg into the skin once a week.       Allergies:  Allergies  Allergen Reactions  . Molds & Smuts Other (See Comments)    Past Medical History:  Diagnosis Date  . 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  . Adenomatous colon polyp 05/07/06; 05/2012   Repeat colonoscopy 05/17/2012  showed 2 small polyps that were removed--path showed Hyperplastic-not adenomatous.  Still needs repeat 5 yrs.  . Angioedema of lips    saw allergist 11/2011  . Asthmatic bronchitis , chronic ([place]) 01/11/2012   Question occupational asthma with no specific agents identified. He does notice that some areas of his job are particularly dusty and dirty. Currently he is well controlled using Symbicort through an AeroChamber and rarely needing his rescue inhaler. This would be an acceptable long-term status. Elevated nonspecific total IgE and peripheral eosinophilia to suggest an atopic trigger with sensitization to something Consider Daliresp for future use if needed   . Chronic fatigue    +daytime somnolence  . Chronic recurrent sinusitis 02/28/2012  . Depression    Prozac in the past not much help.  Spontaneously resolved.  . Diabetes mellitus without complication ([place])    managed by Dr. Dwyane Dee (endo)--this is accurate as of 07/27/17, when pt last saw Dr. Dwyane Dee.  . Diverticulosis   . Generalized anxiety disorder 08/11/2012  . Hemorrhoids    ext and int  . Hyperlipemia, mixed    elev trigs with low HDL. Fish oil OTC.  Statin started in Fall 2018.  Marland Kitchen Hypertension 2017  . Hypogonadotropic hypogonadism ([place]) 04/08/2011   As of 2017/18, Dr. Dwyane Dee (endo) is managing this problem: he started clomiphene but no better at f/u 11/2016.  Pt denied any improvemt,though testost improved.  Clomiphene d/c'd and pt restarted on topical testosterone.  His hypogonadism is likely due to insulin resistance syndrome.  . Insomnia   . Internal hemorrhoids, bleeding and sicharge 05/29/2013  . OSA on CPAP    setting changed to 6 cm H20 (fixed) 10/2016 by pulm.  . Rotator cuff tear 2011   [facility]: conservative mgmt--symptoms stable as of 04/2011.    Past Surgical History:  Procedure Laterality Date  . adnoids    . CARDIOVASCULAR STRESS TEST     Normal stress nuclear study, EF normal. +Hypertensive bp  response--subsequent 24H ambulatory BP monitoring confirmed HTN.  . COLONOSCOPY  05/2006, 05/2012  . HEMORRHOID BANDING    . POLYPECTOMY     Adenomatous 2007; hyperplastic 2013.  Repeat 5 yrs.  . TRANSTHORACIC ECHOCARDIOGRAM  01/30/16   Normal except mild aortic dilatation  . WISDOM TOOTH EXTRACTION      Family History  Problem Relation Age of Onset  . Hypertension Mother   . Diabetes Mother   . CVA Mother   . Hypertension Father   . Diabetes Father   . Dementia Father   . Heart disease Father   . CAD Father     Social History:  reports that  has never smoked. he has never used smokeless tobacco. He reports that he drinks alcohol. He reports that he does not use drugs.  Review of Systems   DIABETES: He had significant hyperglycemia  at onset with A1c about 13  Since he was not benefiting from Actos and metformin given by his PCP he was given Invokana in addition in August 2018 This also did not improve his control with A1c 7.8 Since 07/2017 he has been on Ozempic He has taken 0.5 mg weekly for the last 3 injections  Last A1c was 7.8 Fructosamine now is improved at 234  Current management, problems identified and blood sugar patterns:  He checks blood sugars at various times now and recent range is 108-162, highest reading after a large meal and he thinks these are better than before  He said that with Ozempic he is not able to eat large portions that he was previously eating and also he may get a little nauseated if he is trying to eat fast food like french fries  He has not started exercising as yet but he thinks he can start this week  He has lost 6 pounds  On his own he stopped taking his Actos  His last consultation with dietitian was in 2016  Lab fasting glucose was 98   Wt Readings from Last 3 Encounters:  09/08/17 201 lb (91.2 kg)  07/27/17 207 lb 6.4 oz (94.1 kg)  07/19/17 208 lb 8 oz (94.6 kg)     Lab Results  Component Value Date   HGBA1C 7.8 (H)  07/20/2017   HGBA1C 7.2 (H) 03/18/2017   HGBA1C 6.9 (H) 12/18/2016   Lab Results  Component Value Date   MICROALBUR 0.8 12/18/2016   CREATININE 0.90 09/03/2017     HYPERTENSION:  currently not on any medications He says that he had better blood pressure readings with his recent weight loss and he stopped taking amlodipine on his own Continues to be on Invokana however  General Examination:   BP 126/74   Pulse 80   Ht 5\' 10"  (1.778 m)   Wt 201 lb (91.2 kg)   SpO2 94%   BMI 28.84 kg/m    Assessment/ Plan:    Hypogonadotropic hypogonadism  Most likely related to insulin resistance syndrome  He is taking clomiphene 25 mg daily for this He had complained of fatigue and even though his testosterone level is still in the 300+ range he thinks he is subjectively feeling better and this may be related to his improved blood sugar and some weight loss and more healthier diet He would like to continue clomiphene and not switch to AndroGel as yet  DIABETES type 2:  Her blood sugars are markedly better with adding Ozempic to his Invokana and metformin He is having fairly good blood sugars at [name] and his fasting lab glucose was 98 He has lost weight He does think he can start exercising now also He will try to check more readings after meals and bring his monitor for download on the next visit For now continue 0.5 mg Ozempic weekly and do not change other medications   HYPERTENSION: Controlled with Invokana alone, he can leave off his amlodipine Consider adding ACE inhibitor   Elayne Snare 09/08/2017, 9:01 AM

## 2017-09-13 ENCOUNTER — Other Ambulatory Visit: Payer: Self-pay | Admitting: Family Medicine

## 2017-09-22 ENCOUNTER — Encounter: Payer: Self-pay | Admitting: Family Medicine

## 2017-09-27 DIAGNOSIS — G4733 Obstructive sleep apnea (adult) (pediatric): Secondary | ICD-10-CM | POA: Diagnosis not present

## 2017-10-07 ENCOUNTER — Other Ambulatory Visit: Payer: Self-pay

## 2017-10-07 ENCOUNTER — Telehealth: Payer: Self-pay

## 2017-10-07 NOTE — Telephone Encounter (Signed)
Patient called and he states clomiphene is no longer covered should he find out what is covered or pay out of pocket please advise

## 2017-10-07 NOTE — Telephone Encounter (Signed)
Left vm giving advice from below- I need to know which [facility] it needs to be sent to

## 2017-10-07 NOTE — Telephone Encounter (Signed)
Need new pharmacy for clomiphene

## 2017-10-07 NOTE — Telephone Encounter (Signed)
Out of pocket

## 2017-10-08 NOTE — Telephone Encounter (Signed)
LVM requesting he call back to tell me which [facility]

## 2017-10-15 NOTE — Telephone Encounter (Signed)
Unable to contact pt. 

## 2017-10-21 DIAGNOSIS — M9901 Segmental and somatic dysfunction of cervical region: Secondary | ICD-10-CM | POA: Diagnosis not present

## 2017-10-21 DIAGNOSIS — M531 Cervicobrachial syndrome: Secondary | ICD-10-CM | POA: Diagnosis not present

## 2017-10-21 DIAGNOSIS — M5032 Other cervical disc degeneration, mid-cervical region, unspecified level: Secondary | ICD-10-CM | POA: Diagnosis not present

## 2017-10-22 ENCOUNTER — Other Ambulatory Visit: Payer: Self-pay | Admitting: Endocrinology

## 2017-10-22 DIAGNOSIS — M5032 Other cervical disc degeneration, mid-cervical region, unspecified level: Secondary | ICD-10-CM | POA: Diagnosis not present

## 2017-10-22 DIAGNOSIS — M531 Cervicobrachial syndrome: Secondary | ICD-10-CM | POA: Diagnosis not present

## 2017-10-22 DIAGNOSIS — M9901 Segmental and somatic dysfunction of cervical region: Secondary | ICD-10-CM | POA: Diagnosis not present

## 2017-10-26 DIAGNOSIS — M5032 Other cervical disc degeneration, mid-cervical region, unspecified level: Secondary | ICD-10-CM | POA: Diagnosis not present

## 2017-10-26 DIAGNOSIS — M9901 Segmental and somatic dysfunction of cervical region: Secondary | ICD-10-CM | POA: Diagnosis not present

## 2017-10-26 DIAGNOSIS — M531 Cervicobrachial syndrome: Secondary | ICD-10-CM | POA: Diagnosis not present

## 2017-10-27 DIAGNOSIS — G4733 Obstructive sleep apnea (adult) (pediatric): Secondary | ICD-10-CM | POA: Diagnosis not present

## 2017-10-29 ENCOUNTER — Telehealth: Payer: Self-pay | Admitting: Endocrinology

## 2017-10-29 ENCOUNTER — Other Ambulatory Visit: Payer: Self-pay

## 2017-10-29 MED ORDER — CLOMIPHENE CITRATE 50 MG PO TABS: 25.0000 mg | ORAL_TABLET | Freq: Every day | ORAL | 2 refills | Status: DC

## 2017-10-29 NOTE — Telephone Encounter (Signed)
This prescription has been sent to the pharmacy provided.

## 2017-10-29 NOTE — Telephone Encounter (Signed)
Patient states that   clomiPHENE (CLOMID) 50 MG tablet   [facility] [id number] [place], [place]

## 2017-11-02 DIAGNOSIS — M531 Cervicobrachial syndrome: Secondary | ICD-10-CM | POA: Diagnosis not present

## 2017-11-02 DIAGNOSIS — M5032 Other cervical disc degeneration, mid-cervical region, unspecified level: Secondary | ICD-10-CM | POA: Diagnosis not present

## 2017-11-02 DIAGNOSIS — M9901 Segmental and somatic dysfunction of cervical region: Secondary | ICD-10-CM | POA: Diagnosis not present

## 2017-11-04 DIAGNOSIS — M5032 Other cervical disc degeneration, mid-cervical region, unspecified level: Secondary | ICD-10-CM | POA: Diagnosis not present

## 2017-11-04 DIAGNOSIS — M9901 Segmental and somatic dysfunction of cervical region: Secondary | ICD-10-CM | POA: Diagnosis not present

## 2017-11-04 DIAGNOSIS — M531 Cervicobrachial syndrome: Secondary | ICD-10-CM | POA: Diagnosis not present

## 2017-11-09 DIAGNOSIS — M9901 Segmental and somatic dysfunction of cervical region: Secondary | ICD-10-CM | POA: Diagnosis not present

## 2017-11-09 DIAGNOSIS — M531 Cervicobrachial syndrome: Secondary | ICD-10-CM | POA: Diagnosis not present

## 2017-11-09 DIAGNOSIS — M5032 Other cervical disc degeneration, mid-cervical region, unspecified level: Secondary | ICD-10-CM | POA: Diagnosis not present

## 2017-11-11 DIAGNOSIS — M9901 Segmental and somatic dysfunction of cervical region: Secondary | ICD-10-CM | POA: Diagnosis not present

## 2017-11-11 DIAGNOSIS — M5032 Other cervical disc degeneration, mid-cervical region, unspecified level: Secondary | ICD-10-CM | POA: Diagnosis not present

## 2017-11-11 DIAGNOSIS — M531 Cervicobrachial syndrome: Secondary | ICD-10-CM | POA: Diagnosis not present

## 2017-11-17 DIAGNOSIS — M5032 Other cervical disc degeneration, mid-cervical region, unspecified level: Secondary | ICD-10-CM | POA: Diagnosis not present

## 2017-11-17 DIAGNOSIS — M9901 Segmental and somatic dysfunction of cervical region: Secondary | ICD-10-CM | POA: Diagnosis not present

## 2017-11-17 DIAGNOSIS — M531 Cervicobrachial syndrome: Secondary | ICD-10-CM | POA: Diagnosis not present

## 2017-11-24 DIAGNOSIS — M9901 Segmental and somatic dysfunction of cervical region: Secondary | ICD-10-CM | POA: Diagnosis not present

## 2017-11-24 DIAGNOSIS — M531 Cervicobrachial syndrome: Secondary | ICD-10-CM | POA: Diagnosis not present

## 2017-11-24 DIAGNOSIS — M5032 Other cervical disc degeneration, mid-cervical region, unspecified level: Secondary | ICD-10-CM | POA: Diagnosis not present

## 2017-11-26 DIAGNOSIS — G4733 Obstructive sleep apnea (adult) (pediatric): Secondary | ICD-10-CM | POA: Diagnosis not present

## 2017-12-01 DIAGNOSIS — M9901 Segmental and somatic dysfunction of cervical region: Secondary | ICD-10-CM | POA: Diagnosis not present

## 2017-12-01 DIAGNOSIS — M5032 Other cervical disc degeneration, mid-cervical region, unspecified level: Secondary | ICD-10-CM | POA: Diagnosis not present

## 2017-12-01 DIAGNOSIS — M531 Cervicobrachial syndrome: Secondary | ICD-10-CM | POA: Diagnosis not present

## 2017-12-03 ENCOUNTER — Other Ambulatory Visit (INDEPENDENT_AMBULATORY_CARE_PROVIDER_SITE_OTHER): Payer: 59

## 2017-12-03 DIAGNOSIS — E119 Type 2 diabetes mellitus without complications: Secondary | ICD-10-CM

## 2017-12-03 DIAGNOSIS — E291 Testicular hypofunction: Secondary | ICD-10-CM

## 2017-12-03 LAB — BASIC METABOLIC PANEL
BUN: 14 mg/dL (ref 6–23)
CHLORIDE: 102 meq/L (ref 96–112)
CO2: 26 meq/L (ref 19–32)
CREATININE: 0.94 mg/dL (ref 0.40–1.50)
Calcium: 9.6 mg/dL (ref 8.4–10.5)
GFR: 87.41 mL/min (ref >60.00)
Glucose, Bld: 110 mg/dL — ABNORMAL HIGH (ref 70–99)
POTASSIUM: 4.4 meq/L (ref 3.5–5.1)
Sodium: 139 mEq/L (ref 135–145)

## 2017-12-03 LAB — TESTOSTERONE: Testosterone: 321.59 ng/dL (ref 300.00–890.00)

## 2017-12-03 LAB — HEMOGLOBIN A1C: HEMOGLOBIN A1C: 6.7 % — AB (ref 4.6–6.5)

## 2017-12-07 ENCOUNTER — Encounter: Payer: Self-pay | Admitting: Endocrinology

## 2017-12-07 ENCOUNTER — Ambulatory Visit: Payer: 59 | Admitting: Endocrinology

## 2017-12-07 VITALS — BP 122/84 | HR 87 | Ht 70.0 in | Wt 199.0 lb

## 2017-12-07 DIAGNOSIS — E119 Type 2 diabetes mellitus without complications: Secondary | ICD-10-CM

## 2017-12-07 DIAGNOSIS — I10 Essential (primary) hypertension: Secondary | ICD-10-CM | POA: Diagnosis not present

## 2017-12-07 DIAGNOSIS — E782 Mixed hyperlipidemia: Secondary | ICD-10-CM | POA: Diagnosis not present

## 2017-12-07 DIAGNOSIS — E230 Hypopituitarism: Secondary | ICD-10-CM

## 2017-12-07 MED ORDER — TESTOSTERONE 20.25 MG/ACT (1.62%) TD GEL: TRANSDERMAL | 2 refills | Status: DC

## 2017-12-07 NOTE — Progress Notes (Signed)
Patient ID: Douglas [name], male   DOB: 25-Sep-1960, 57 y.o.   MRN: [id number]           Chief complaint: Endocrinology follow-up  Referring physician: McGowan   History of Present Illness  PROBLEM 1:  Hypogonadism: This was diagnosed in 2009  He was apparently initially seen by his psychiatrist and was having difficulties with fatigue, agoraphobia and decreased libido as well as sexual dysfunction. He was told that his testosterone level was significantly low and was given testosterone supplements probably with AndroGel Subsequently the patient has been treated by various physicians including the urologist with testosterone supplements, mostly AndroGel He thinks that his testosterone levels have never been normal with AndroGel although he thinks that he felt a little better with taking this from his urologist who called him to apply it on his abdomen  He was also tried on monthly injections for about 3 months without consistent benefit also In 2016 he was given [place] again without any benefit   RECENT history: He had complaints offatigue, decreased motivation, decreased libido and erectile dysfunction on his initial consultation. Initial evaluation showed low free testosterone of 4.5 and normal LH  He has been on clomiphene after his initial consultation in 09/2015 He is taking 25 mg daily   He thinks that he is having some fatigue and decreased libido more recently but also concerned about erectile dysfunction He was recommended starting AndroGel previously but he had been reluctant to do so Testosterone level appear to be gradually decreasing now   His lab results showtestosterone levels as follows:  Lab Results  Component Value Date   TESTOSTERONE 321.59 12/03/2017   TESTOSTERONE 360.82 09/03/2017   TESTOSTERONE 399.07 12/18/2016   TESTOSTERONE 349.53 10/23/2016      Lab Results  Component Value Date   LH 2.89 09/03/2016    DIABETES: He had significant  hyperglycemia at onset with A1c about 13  Since he was not benefiting from Actos and metformin given by his PCP he was given Invokana in addition in August 2018 This also did not improve his control with A1c 7.8 Since 07/2017 he has been on Ozempic He has taken 0.5 mg weekly   Last A1c was 7.8 and is now 6.7  Current management, problems identified and blood sugar patterns:  He did not bring his monitor for download despite reminders  He thinks his having fairly good blood sugars, under 150 but does not usually check after meals including after lunchtime where he has a larger meal  Despite reminders he has not done any exercise, he does not like to go walking but occasionally may get on his treadmill  He says that he is going to start doing yoga at [name]  His meals may not be completely better standing in the morning made only some fruit or dry cereal  He still is eating small portions with the help of Ozempic no nausea with this  His last consultation with dietitian was in 2016  Blood sugars at [name]: Fasting 110-130; after meals 150  Lab fasting glucose was 110      Wt Readings from Last 3 Encounters:  12/07/17 199 lb (90.3 kg)  09/08/17 201 lb (91.2 kg)  07/27/17 207 lb 6.4 oz (94.1 kg)     Lab Results  Component Value Date   HGBA1C 6.7 (H) 12/03/2017   HGBA1C 7.8 (H) 07/20/2017   HGBA1C 7.2 (H) 03/18/2017   Lab Results  Component Value Date   MICROALBUR 0.8 12/18/2016  CREATININE 0.94 12/03/2017     Allergies as of 12/07/2017      Reactions   Molds & Smuts Other (See Comments)      Medication List        Accurate as of 12/07/17  8:37 AM. Always use your most recent med list.          albuterol 108 (90 Base) MCG/ACT inhaler Commonly known as:  VENTOLIN HFA Inhale 1-2 puffs into the lungs every 4 (four) hours as needed for wheezing.   atorvastatin 40 MG tablet Commonly known as:  LIPITOR Take 1 tablet (40 mg total) by mouth daily.     budesonide-formoterol 160-4.5 MCG/ACT inhaler Commonly known as:  SYMBICORT Inhale 2 puffs into the lungs 2 (two) times daily.   clomiPHENE 50 MG tablet Commonly known as:  CLOMID Take 0.5 tablets (25 mg total) by mouth daily.   glucose blood test strip Check glucose bid   INVOKANA 100 MG Tabs tablet Generic drug:  canagliflozin TAKE 1 TABLET BY MOUTH BEFORE BREAKFAST   irbesartan 150 MG tablet Commonly known as:  AVAPRO TAKE 1 TABLET BY MOUTH EVERY DAY   metFORMIN 1000 MG tablet Commonly known as:  GLUCOPHAGE Take 1 tablet (1,000 mg total) by mouth 2 (two) times daily with a meal.   OZEMPIC 0.25 or 0.5 MG/DOSE Sopn Generic drug:  Semaglutide INJECT 0.5 MG INTO THE SKIN ONCE A WEEK.       Allergies:  Allergies  Allergen Reactions  . Molds & Smuts Other (See Comments)    Past Medical History:  Diagnosis Date  . 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  . Adenomatous colon polyp 05/07/06; 05/2012   Repeat colonoscopy 05/17/2012 showed 2 small polyps that were removed--path showed Hyperplastic-not adenomatous.  Still needs repeat 5 yrs.  . Angioedema of lips    saw allergist 11/2011  . Asthmatic bronchitis , chronic ([name]) 01/11/2012   Question occupational asthma with no specific agents identified. He does notice that some areas of his job are particularly dusty and dirty. Currently he is well controlled using Symbicort through an AeroChamber and rarely needing his rescue inhaler. This would be an acceptable long-term status. Elevated nonspecific total IgE and peripheral eosinophilia to suggest an atopic trigger with sensitization to something Consider Daliresp for future use if needed   . Chronic fatigue    +daytime somnolence  . Chronic recurrent sinusitis 02/28/2012  . Depression    Prozac in the past not much help.  Spontaneously resolved.  . Diabetes mellitus without complication ([place])    managed  by Dr. Dwyane Dee ([facility])--this is accurate as of 08/2017, when pt last saw Dr. Dwyane Dee.  . Diverticulosis   . Generalized anxiety disorder 08/11/2012  . Hemorrhoids    ext and int  . Hyperlipemia, mixed    elev trigs with low HDL. Fish oil OTC.  Statin started in Fall 2018.  Marland Kitchen Hypertension 2017  . Hypogonadotropic hypogonadism ([place]) 04/08/2011   As of 2017/18/19, Dr. Dwyane Dee ([facility]) is managing this problem: he started clomiphene but no better at f/u 11/2016.  Pt denied any improvemt,though testost improved.  Clomiphene d/c'd and pt restarted on topical testosterone.  His hypogonadism is likely due to insulin resistance syndrome.  As of 08/2017 endo f/u: pt staying on clomiphene (no testost) and is feeling some improved.  . Insomnia   . Internal hemorrhoids, bleeding and sicharge 05/29/2013  . OSA  on CPAP    setting changed to 6 cm H20 (fixed) 10/2016 by pulm.  . Rotator cuff tear 2011   [facility]: conservative mgmt--symptoms stable as of 04/2011.    Past Surgical History:  Procedure Laterality Date  . adnoids    . CARDIOVASCULAR STRESS TEST     Normal stress nuclear study, EF normal. +Hypertensive bp response--subsequent 24H ambulatory BP monitoring confirmed HTN.  . COLONOSCOPY  05/2006, 05/2012  . HEMORRHOID BANDING    . POLYPECTOMY     Adenomatous 2007; hyperplastic 2013.  Repeat 5 yrs.  . TRANSTHORACIC ECHOCARDIOGRAM  01/30/16   Normal except mild aortic dilatation  . WISDOM TOOTH EXTRACTION      Family History  Problem Relation Age of Onset  . Hypertension Mother   . Diabetes Mother   . CVA Mother   . Hypertension Father   . Diabetes Father   . Dementia Father   . Heart disease Father   . CAD Father     Social History:  reports that he has never smoked. He has never used smokeless tobacco. He reports that he drinks alcohol. He reports that he does not use drugs.  Review of Systems    HYPERTENSION:    Currently being treated by me with Avapro He says that he had good blood  pressure readings at [name], diastolic may be over 88 times  Continues to be on Invokana also   General Examination:   BP 122/84 (BP Location: Left Arm, Patient Position: Sitting, Cuff Size: Normal)   Pulse 87   Ht 5\' 10"  (1.778 m)   Wt 199 lb (90.3 kg)   SpO2 97%   BMI 28.55 kg/m    Assessment/ Plan:    Hypogonadotropic hypogonadism  This is secondary to insulin resistance syndrome  He is taking clomiphene 25 mg daily for about 2 years now Although he initially had improvement in his energy level and libido as well as testosterone went up to about 400 he is now having more fatigue and decreased libido and his testosterone level is gradually trending lower  We will start AndroGel, to apply this on his shoulder and follow-up in 3 months repeat level after starting 3 pumps a day Discussed that since clomiphene is getting less effective it will probably not work in the long run and he can stop this  Erectile dysfunction: Likely to be secondary to diabetic neuropathy/vascular problem and he will just treatment with his PCP  DIABETES type 2:  Her blood sugars are well controlled  with adding Ozempic to his Invokana and metformin A1c is significantly better at 6.7 Also has good readings at [name] but not taking enough readings after meals He does need to start a exercise program, discussed that he should do whatever aerobic exercise he would like to stay on consistently Also advised him that he needs to add protein consistently at every meal including breakfast Discussed blood sugar targets after meals  HYPERTENSION: Controlled with Invokana and Avapro No microalbuminuria but this needs to be rechecked  Lightheaded episodes: Does not appear to have orthostatic hypotension and he will follow-up with PCP To check standing blood pressure when he has symptoms  Increased triglycerides: Needs fasting labs for follow-up, discussed controlling carbohydrates and fats for lower  levels  Elayne Snare 12/07/2017, 8:37 AM   Total visit time for evaluation and management of multiple problems and counseling =25 minutes

## 2017-12-07 NOTE — Patient Instructions (Addendum)
Check blood sugars on waking up  1/7  Also check blood sugars about 2 hours after a meal and do this after different meals by rotation  Recommended blood sugar levels on waking up is 90-120 and about 2 hours after meal is 130-160  Please bring your blood sugar monitor to each visit, thank you

## 2017-12-08 DIAGNOSIS — M9901 Segmental and somatic dysfunction of cervical region: Secondary | ICD-10-CM | POA: Diagnosis not present

## 2017-12-08 DIAGNOSIS — M531 Cervicobrachial syndrome: Secondary | ICD-10-CM | POA: Diagnosis not present

## 2017-12-08 DIAGNOSIS — M5032 Other cervical disc degeneration, mid-cervical region, unspecified level: Secondary | ICD-10-CM | POA: Diagnosis not present

## 2017-12-11 ENCOUNTER — Other Ambulatory Visit: Payer: Self-pay | Admitting: Family Medicine

## 2017-12-15 DIAGNOSIS — M5032 Other cervical disc degeneration, mid-cervical region, unspecified level: Secondary | ICD-10-CM | POA: Diagnosis not present

## 2017-12-15 DIAGNOSIS — M531 Cervicobrachial syndrome: Secondary | ICD-10-CM | POA: Diagnosis not present

## 2017-12-15 DIAGNOSIS — M9901 Segmental and somatic dysfunction of cervical region: Secondary | ICD-10-CM | POA: Diagnosis not present

## 2017-12-21 ENCOUNTER — Other Ambulatory Visit: Payer: Self-pay | Admitting: Endocrinology

## 2017-12-22 ENCOUNTER — Encounter: Payer: Self-pay | Admitting: Family Medicine

## 2017-12-22 DIAGNOSIS — M9901 Segmental and somatic dysfunction of cervical region: Secondary | ICD-10-CM | POA: Diagnosis not present

## 2017-12-22 DIAGNOSIS — M5032 Other cervical disc degeneration, mid-cervical region, unspecified level: Secondary | ICD-10-CM | POA: Diagnosis not present

## 2017-12-22 DIAGNOSIS — M531 Cervicobrachial syndrome: Secondary | ICD-10-CM | POA: Diagnosis not present

## 2017-12-26 DIAGNOSIS — G4733 Obstructive sleep apnea (adult) (pediatric): Secondary | ICD-10-CM | POA: Diagnosis not present

## 2018-01-22 ENCOUNTER — Other Ambulatory Visit: Payer: Self-pay | Admitting: Endocrinology

## 2018-01-25 DIAGNOSIS — G4733 Obstructive sleep apnea (adult) (pediatric): Secondary | ICD-10-CM | POA: Diagnosis not present

## 2018-03-02 ENCOUNTER — Other Ambulatory Visit: Payer: Self-pay | Admitting: Family Medicine

## 2018-03-04 ENCOUNTER — Other Ambulatory Visit: Payer: 59

## 2018-03-04 ENCOUNTER — Telehealth: Payer: Self-pay | Admitting: *Deleted

## 2018-03-04 MED ORDER — TELMISARTAN 40 MG PO TABS: 40.0000 mg | ORAL_TABLET | Freq: Every day | ORAL | 1 refills | Status: DC

## 2018-03-04 NOTE — Telephone Encounter (Signed)
Spoke with patient let him know his blood pressure medication was changed because his current medication is on backorder. Instructed patient to follow up with Dr Anitra Lauth in 2-3 weeks for BP check to make sure medication is working well for him. Patient verbalized understanding. Offered to schedule his appt he stated he would have to call back.

## 2018-03-04 NOTE — Telephone Encounter (Signed)
Pharmacy sent message stating patients Irbesartan is on backorder all strengths are on backorder. Do you want to change to something else? Please advise.

## 2018-03-07 ENCOUNTER — Telehealth: Payer: Self-pay

## 2018-03-07 NOTE — Telephone Encounter (Signed)
Opened in error

## 2018-03-08 ENCOUNTER — Encounter: Payer: Self-pay | Admitting: Endocrinology

## 2018-03-08 ENCOUNTER — Ambulatory Visit: Payer: 59 | Admitting: Endocrinology

## 2018-03-08 VITALS — BP 145/85 | HR 93 | Ht 70.0 in | Wt 206.4 lb

## 2018-03-08 DIAGNOSIS — E230 Hypopituitarism: Secondary | ICD-10-CM

## 2018-03-08 DIAGNOSIS — E782 Mixed hyperlipidemia: Secondary | ICD-10-CM | POA: Diagnosis not present

## 2018-03-08 DIAGNOSIS — E119 Type 2 diabetes mellitus without complications: Secondary | ICD-10-CM | POA: Diagnosis not present

## 2018-03-08 LAB — MICROALBUMIN / CREATININE URINE RATIO
CREATININE, U: 40.9 mg/dL
Microalb Creat Ratio: 1.7 mg/g (ref 0.0–30.0)
Microalb, Ur: <0.7 mg/dL (ref 0.0–1.9)

## 2018-03-08 LAB — BASIC METABOLIC PANEL
BUN: 11 mg/dL (ref 6–23)
CHLORIDE: 105 meq/L (ref 96–112)
CO2: 30 meq/L (ref 19–32)
CREATININE: 0.89 mg/dL (ref 0.40–1.50)
Calcium: 9.1 mg/dL (ref 8.4–10.5)
GFR: 93.01 mL/min (ref >60.00)
Glucose, Bld: 168 mg/dL — ABNORMAL HIGH (ref 70–99)
POTASSIUM: 4.2 meq/L (ref 3.5–5.1)
Sodium: 142 mEq/L (ref 135–145)

## 2018-03-08 LAB — POCT GLYCOSYLATED HEMOGLOBIN (HGB A1C): HEMOGLOBIN A1C: 6.3 % — AB (ref 4.0–5.6)

## 2018-03-08 LAB — LIPID PANEL
CHOLESTEROL: 88 mg/dL (ref 0–200)
HDL: 25.7 mg/dL — AB (ref >39.00)
NONHDL: 62.52
TRIGLYCERIDES: 395 mg/dL — AB (ref 0.0–149.0)
Total CHOL/HDL Ratio: 3
VLDL: 79 mg/dL — ABNORMAL HIGH (ref 0.0–40.0)

## 2018-03-08 LAB — TESTOSTERONE: Testosterone: 151.55 ng/dL — ABNORMAL LOW (ref 300.00–890.00)

## 2018-03-08 LAB — LDL CHOLESTEROL, DIRECT: Direct LDL: 33 mg/dL

## 2018-03-08 NOTE — Progress Notes (Signed)
Patient ID: Douglas [name], male   DOB: 04-18-1961, 57 y.o.   MRN: [id number]           Chief complaint: Endocrinology follow-up  Referring physician: McGowan   History of Present Illness  PROBLEM 1:  Hypogonadism: This was diagnosed in 2009  He was apparently initially seen by his psychiatrist and was having difficulties with fatigue, agoraphobia and decreased libido as well as sexual dysfunction. He was told that his testosterone level was significantly low and was given testosterone supplements probably with AndroGel Subsequently the patient has been treated by various physicians including the urologist with testosterone supplements, mostly AndroGel He thinks that his testosterone levels have never been normal with AndroGel although he thinks that he felt a little better with taking this from his urologist who called him to apply it on his abdomen  He was also tried on monthly injections for about 3 months without consistent benefit also In 2016 he was given [place] again without any benefit   RECENT history: He had complaints offatigue, decreased motivation, decreased libido and erectile dysfunction on his initial consultation. Initial evaluation showed low free testosterone of 4.5 and normal LH   He had been on clomiphene 25 mg daily after his initial consultation in 09/2015 Because of continued fatigue, decreased libido and testosterone levels being on the low end of normal he was switched to ANDROGEL 3 pumps daily in 11/2017  He still complains of fatigue and decreased libido without any improvement However has difficulty sleeping also Labs pending   His lab results showtestosterone levels as follows:  Lab Results  Component Value Date   TESTOSTERONE 321.59 12/03/2017   TESTOSTERONE 360.82 09/03/2017   TESTOSTERONE 399.07 12/18/2016   TESTOSTERONE 349.53 10/23/2016      Lab Results  Component Value Date   LH 2.89 09/03/2016    DIABETES: He had  significant hyperglycemia at onset with A1c about 13  Since he was not benefiting from Actos and metformin given by his PCP he was given Invokana in addition in August 2018 This also did not improve his control  Since 07/2017 he has been on Ozempic  Baseline A1c was 7.8 and is now 6.3  Current management, problems identified and blood sugar patterns:  He does not bring his monitor for download despite reminders  With taking 0.5 mg Ozempic and continuing Invokana his blood sugars appear to be excellent at [name]  A1c is further improved  He does not do any formal exercise, just a little yard work  He has gained weight and he thinks he may be getting more carbohydrate and sandwiches  He does check blood sugars at [name] sporadically but mostly in the morning  His last consultation with dietitian was in 2016  Blood sugars at [name]: Fasting 95-125 recently, after meals checked less often but usually not higher       Wt Readings from Last 3 Encounters:  03/08/18 206 lb 6.4 oz (93.6 kg)  12/07/17 199 lb (90.3 kg)  09/08/17 201 lb (91.2 kg)     Lab Results  Component Value Date   HGBA1C 6.3 (A) 03/08/2018   HGBA1C 6.7 (H) 12/03/2017   HGBA1C 7.8 (H) 07/20/2017   Lab Results  Component Value Date   MICROALBUR 0.8 12/18/2016   CREATININE 0.94 12/03/2017     Allergies as of 03/08/2018      Reactions   Molds & Smuts Other (See Comments)      Medication List  Accurate as of 03/08/18  2:09 PM. Always use your most recent med list.          albuterol 108 (90 Base) MCG/ACT inhaler Commonly known as:  VENTOLIN HFA Inhale 1-2 puffs into the lungs every 4 (four) hours as needed for wheezing.   atorvastatin 40 MG tablet Commonly known as:  LIPITOR TAKE 1 TABLET BY MOUTH EVERY DAY   budesonide-formoterol 160-4.5 MCG/ACT inhaler Commonly known as:  SYMBICORT Inhale 2 puffs into the lungs 2 (two) times daily.   glucose blood test strip Check glucose bid     INVOKANA 100 MG Tabs tablet Generic drug:  canagliflozin TAKE 1 TABLET BY MOUTH BEFORE BREAKFAST   metFORMIN 1000 MG tablet Commonly known as:  GLUCOPHAGE TAKE 1 TABLET (1,000 MG TOTAL) BY MOUTH 2 (TWO) TIMES DAILY WITH A MEAL.   OZEMPIC 0.25 or 0.5 MG/DOSE Sopn Generic drug:  Semaglutide INJECT 0.5 MG INTO THE SKIN ONCE A WEEK.   telmisartan 40 MG tablet Commonly known as:  MICARDIS Take 1 tablet (40 mg total) by mouth daily.   Testosterone 20.25 MG/ACT (1.62%) Gel Commonly known as:  ANDROGEL PUMP Apply 2 pumps on one shoulder and one on the other morning.  Allow to dry before dressing       Allergies:  Allergies  Allergen Reactions  . Molds & Smuts Other (See Comments)    Past Medical History:  Diagnosis Date  . 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  . Adenomatous colon polyp 05/07/06; 05/2012   Repeat colonoscopy 05/17/2012 showed 2 small polyps that were removed--path showed Hyperplastic-not adenomatous.  Still needs repeat 5 yrs.  . Angioedema of lips    saw allergist 11/2011  . Asthmatic bronchitis , chronic ([place]) 01/11/2012   Question occupational asthma with no specific agents identified. He does notice that some areas of his job are particularly dusty and dirty. Currently he is well controlled using Symbicort through an AeroChamber and rarely needing his rescue inhaler. This would be an acceptable long-term status. Elevated nonspecific total IgE and peripheral eosinophilia to suggest an atopic trigger with sensitization to something Consider Daliresp for future use if needed   . Chronic fatigue    +daytime somnolence  . Chronic recurrent sinusitis 02/28/2012  . Depression    Prozac in the past not much help.  Spontaneously resolved.  . Diabetes mellitus without complication ([place])    managed by Dr. Dwyane Dee (endo)--this is accurate as of 08/2017, when pt last saw Dr. Dwyane Dee.  . Diverticulosis   .  Generalized anxiety disorder 08/11/2012  . Hemorrhoids    ext and int  . Hyperlipemia, mixed    elev trigs with low HDL. Fish oil OTC.  Statin started in Fall 2018.  Marland Kitchen Hypertension 2017  . Hypogonadotropic hypogonadism ([place]) 04/08/2011   As of 2017/18/19, Dr. Dwyane Dee (endo) is managing this problem: he started clomiphene but no better at f/u 11/2016.  Pt denied any improvemt,though testost improved.  11/2017 Clomiphene d/c'd and pt restarted on topical testosterone.  His hypogonadism is likely due to insulin resistance syndrome.  As of 08/2017 endo f/u: pt staying on clomiphene (no testost) and is feeling some improved.  . Insomnia   . Internal hemorrhoids, bleeding and sicharge 05/29/2013  . OSA on CPAP    setting changed to 6 cm H20 (fixed) 10/2016 by pulm.  . Rotator cuff tear 2011   [facility]: conservative mgmt--symptoms  stable as of 04/2011.    Past Surgical History:  Procedure Laterality Date  . adnoids    . CARDIOVASCULAR STRESS TEST     Normal stress nuclear study, EF normal. +Hypertensive bp response--subsequent 24H ambulatory BP monitoring confirmed HTN.  . COLONOSCOPY  05/2006, 05/2012  . HEMORRHOID BANDING    . POLYPECTOMY     Adenomatous 2007; hyperplastic 2013.  Repeat 5 yrs.  . TRANSTHORACIC ECHOCARDIOGRAM  01/30/16   Normal except mild aortic dilatation  . WISDOM TOOTH EXTRACTION      Family History  Problem Relation Age of Onset  . Hypertension Mother   . Diabetes Mother   . CVA Mother   . Hypertension Father   . Diabetes Father   . Dementia Father   . Heart disease Father   . CAD Father     Social History:  reports that he has never smoked. He has never used smokeless tobacco. He reports that he drinks alcohol. He reports that he does not use drugs.  Review of Systems    HYPERTENSION:    Currently being treated by PCP He was switched from Avapro to telmisartan by PCP since Avapro is reportedly out of stock  Blood pressure relatively high today but he  thinks this is from being upset He says that he had good blood pressure readings at [name], 125-135/<80  Continues to be on Invokana    BP Readings from Last 3 Encounters:  03/08/18 (!) 145/85  12/07/17 122/84  09/08/17 126/74      General Examination:   BP (!) 145/85   Pulse 93   Ht 5\' 10"  (1.778 m)   Wt 206 lb 6.4 oz (93.6 kg)   SpO2 96%   BMI 29.62 kg/m    Assessment/ Plan:    Hypogonadotropic hypogonadism, long-standing  This is secondary to insulin resistance syndrome  He tends to have fatigue which does not always correlate with his testosterone level With previously taking clomiphene 25 mg daily he did not have sustained improvement in now is on AndroGel 3 pumps daily since his last level was low normal Even with this dosage he does not subjectively feel any different or better but needs follow-up testosterone level done today   DIABETES type 2:  Her blood sugars are well controlled  with adding Ozempic to his Invokana and metformin A1c is significantly better at 6.3  Also has good readings at [name] but not taking enough readings after meals Again reminded him about exercise since he is starting to gain weight  HYPERTENSION:  Now followed by PCP He thinks he has good readings at [name] and today blood pressure is higher from anxiety    Elayne Snare 03/08/2018, 2:09 PM

## 2018-03-09 NOTE — Telephone Encounter (Signed)
Noted agree

## 2018-03-11 ENCOUNTER — Encounter (INDEPENDENT_AMBULATORY_CARE_PROVIDER_SITE_OTHER): Payer: Self-pay

## 2018-03-22 ENCOUNTER — Encounter: Payer: Self-pay | Admitting: Family Medicine

## 2018-03-26 DIAGNOSIS — G4733 Obstructive sleep apnea (adult) (pediatric): Secondary | ICD-10-CM | POA: Diagnosis not present

## 2018-03-31 ENCOUNTER — Encounter: Payer: Self-pay | Admitting: Endocrinology

## 2018-04-06 ENCOUNTER — Other Ambulatory Visit: Payer: Self-pay | Admitting: Physician Assistant

## 2018-04-06 ENCOUNTER — Encounter: Payer: Self-pay | Admitting: Family Medicine

## 2018-04-06 ENCOUNTER — Encounter: Payer: Self-pay | Admitting: Emergency Medicine

## 2018-04-06 NOTE — Telephone Encounter (Signed)
My chart message sent to patient advising he is due for a  Follow up appointment for his blood pressure.

## 2018-04-06 NOTE — Telephone Encounter (Signed)
Will rx 30d supply with 1 RF. Pt is overdue for f/u of his HTN. He needs to make an appointment for f/u (OR he can make CPE appt) before he runs out of this med I sent in today.

## 2018-04-17 ENCOUNTER — Other Ambulatory Visit: Payer: Self-pay | Admitting: Endocrinology

## 2018-04-19 ENCOUNTER — Other Ambulatory Visit: Payer: Self-pay | Admitting: Endocrinology

## 2018-04-25 DIAGNOSIS — G4733 Obstructive sleep apnea (adult) (pediatric): Secondary | ICD-10-CM | POA: Diagnosis not present

## 2018-05-23 ENCOUNTER — Ambulatory Visit (INDEPENDENT_AMBULATORY_CARE_PROVIDER_SITE_OTHER): Payer: 59

## 2018-05-23 DIAGNOSIS — Z23 Encounter for immunization: Secondary | ICD-10-CM

## 2018-05-25 DIAGNOSIS — G4733 Obstructive sleep apnea (adult) (pediatric): Secondary | ICD-10-CM | POA: Diagnosis not present

## 2018-06-06 ENCOUNTER — Other Ambulatory Visit: Payer: Self-pay | Admitting: Endocrinology

## 2018-06-06 ENCOUNTER — Other Ambulatory Visit: Payer: Self-pay | Admitting: Family Medicine

## 2018-06-07 ENCOUNTER — Telehealth: Payer: Self-pay | Admitting: Endocrinology

## 2018-06-07 NOTE — Telephone Encounter (Signed)
Rx testosterone 20.25mg /Gel faxed--[facility]

## 2018-06-11 DIAGNOSIS — L259 Unspecified contact dermatitis, unspecified cause: Secondary | ICD-10-CM | POA: Diagnosis not present

## 2018-06-24 DIAGNOSIS — G4733 Obstructive sleep apnea (adult) (pediatric): Secondary | ICD-10-CM | POA: Diagnosis not present

## 2018-07-06 ENCOUNTER — Other Ambulatory Visit (INDEPENDENT_AMBULATORY_CARE_PROVIDER_SITE_OTHER): Payer: 59

## 2018-07-06 DIAGNOSIS — E230 Hypopituitarism: Secondary | ICD-10-CM

## 2018-07-06 DIAGNOSIS — E782 Mixed hyperlipidemia: Secondary | ICD-10-CM | POA: Diagnosis not present

## 2018-07-06 DIAGNOSIS — E119 Type 2 diabetes mellitus without complications: Secondary | ICD-10-CM | POA: Diagnosis not present

## 2018-07-06 LAB — COMPREHENSIVE METABOLIC PANEL
ALT: 35 U/L (ref 0–53)
AST: 23 U/L (ref 0–37)
Albumin: 4.4 g/dL (ref 3.5–5.2)
Alkaline Phosphatase: 54 U/L (ref 39–117)
BILIRUBIN TOTAL: 0.8 mg/dL (ref 0.2–1.2)
BUN: 11 mg/dL (ref 6–23)
CALCIUM: 9.8 mg/dL (ref 8.4–10.5)
CO2: 27 meq/L (ref 19–32)
CREATININE: 0.83 mg/dL (ref 0.40–1.50)
Chloride: 102 mEq/L (ref 96–112)
GFR: 100.7 mL/min (ref >60.00)
Glucose, Bld: 99 mg/dL (ref 70–99)
Potassium: 4 mEq/L (ref 3.5–5.1)
Sodium: 138 mEq/L (ref 135–145)
TOTAL PROTEIN: 7.1 g/dL (ref 6.0–8.3)

## 2018-07-06 LAB — LIPID PANEL
CHOL/HDL RATIO: 4
Cholesterol: 105 mg/dL (ref 0–200)
HDL: 29.1 mg/dL — ABNORMAL LOW (ref >39.00)
NonHDL: 76.35
Triglycerides: 330 mg/dL — ABNORMAL HIGH (ref 0.0–149.0)
VLDL: 66 mg/dL — ABNORMAL HIGH (ref 0.0–40.0)

## 2018-07-06 LAB — TESTOSTERONE: TESTOSTERONE: 271.07 ng/dL — AB (ref 300.00–890.00)

## 2018-07-06 LAB — HEMOGLOBIN A1C: Hgb A1c MFr Bld: 6.8 % — ABNORMAL HIGH (ref 4.6–6.5)

## 2018-07-06 LAB — LDL CHOLESTEROL, DIRECT: Direct LDL: 42 mg/dL

## 2018-07-10 ENCOUNTER — Other Ambulatory Visit: Payer: Self-pay | Admitting: Endocrinology

## 2018-07-12 ENCOUNTER — Encounter: Payer: Self-pay | Admitting: Endocrinology

## 2018-07-12 ENCOUNTER — Ambulatory Visit: Payer: 59 | Admitting: Endocrinology

## 2018-07-12 VITALS — BP 136/84 | HR 86 | Ht 70.0 in | Wt 203.0 lb

## 2018-07-12 DIAGNOSIS — E782 Mixed hyperlipidemia: Secondary | ICD-10-CM

## 2018-07-12 DIAGNOSIS — E1165 Type 2 diabetes mellitus with hyperglycemia: Secondary | ICD-10-CM | POA: Diagnosis not present

## 2018-07-12 DIAGNOSIS — R197 Diarrhea, unspecified: Secondary | ICD-10-CM

## 2018-07-12 DIAGNOSIS — E230 Hypopituitarism: Secondary | ICD-10-CM | POA: Diagnosis not present

## 2018-07-12 DIAGNOSIS — I10 Essential (primary) hypertension: Secondary | ICD-10-CM

## 2018-07-12 MED ORDER — VALSARTAN 160 MG PO TABS: 160.0000 mg | ORAL_TABLET | Freq: Every day | ORAL | 0 refills | Status: DC

## 2018-07-12 MED ORDER — CLOMIPHENE CITRATE 50 MG PO TABS: 25.0000 mg | ORAL_TABLET | Freq: Every day | ORAL | 2 refills | Status: DC

## 2018-07-12 MED ORDER — FENOFIBRATE 145 MG PO TABS: 145.0000 mg | ORAL_TABLET | Freq: Every day | ORAL | 3 refills | Status: DC

## 2018-07-12 NOTE — Progress Notes (Signed)
Patient ID: Douglas [name], male   DOB: 1961/04/28, 57 y.o.   MRN: [id number]           Chief complaint: Endocrinology follow-up  Referring physician: McGowan   History of Present Illness  PROBLEM 1:  Hypogonadism: This was diagnosed in 2009  He was apparently initially seen by his psychiatrist and was having difficulties with fatigue, agoraphobia and decreased libido as well as sexual dysfunction. He was told that his testosterone level was significantly low and was given testosterone supplements probably with AndroGel Subsequently the patient has been treated by various physicians including the urologist with testosterone supplements, mostly AndroGel He thinks that his testosterone levels have never been normal with AndroGel although he thinks that he felt a little better with taking this from his urologist who called him to apply it on his abdomen  He was also tried on monthly injections for about 3 months without consistent benefit also In 2016 he was given [place] again without any benefit   RECENT history: He had complaints offatigue, decreased motivation, decreased libido and erectile dysfunction on his initial consultation. Initial evaluation showed low free testosterone of 4.5 and normal LH   He had been on clomiphene 25 mg daily after his initial consultation in 09/2015  Because of continued fatigue, decreased libido and testosterone levels being on the low end of normal he was switched to ANDROGEL 3 pumps daily in 11/2017 However his level was still low at 151 and he was told to increase the dose to 2 pumps on each arm He says that he feels some persistent fatigue although his libido is increased with testosterone; he is not desiring this since he has ED also Also still has some lack of motivation and malaise However has difficulty sleeping also Labs show somewhat better testosterone level but still below 300   His lab results showtestosterone levels as  follows:  Lab Results  Component Value Date   TESTOSTERONE 271.07 (L) 07/06/2018   TESTOSTERONE 151.55 (L) 03/08/2018   TESTOSTERONE 321.59 12/03/2017   TESTOSTERONE 360.82 09/03/2017     Lab Results  Component Value Date   LH 2.89 09/03/2016    DIABETES type II: He had significant hyperglycemia at onset with A1c about 13  Since he was not benefiting from Actos and metformin given by his PCP he was given Invokana in addition in August 2018 Since 07/2017 he has been on Ozempic  Baseline A1c was 7.8 and is now 6.8  Current management, problems identified and blood sugar patterns:  His A1c has gone up about 0.5  This is despite his weight coming down 3 pounds and he thinks it may be related to his not being consistent on his diet  He is again not motivated to do any formal exercise even though he has a treadmill at [name]  He does not bring his monitor for download despite reminders  His fasting lab work was only 99  Mostly doing fasting readings at [name]  However he does not think his blood sugars are consistently high and only once when he went off his diet it was high at 196  His last consultation with dietitian was in 2016  Blood sugars at [name]: Variable, highest 196, did not bring meter again     Wt Readings from Last 3 Encounters:  07/12/18 203 lb (92.1 kg)  03/08/18 206 lb 6.4 oz (93.6 kg)  12/07/17 199 lb (90.3 kg)     Lab Results  Component Value Date  HGBA1C 6.8 (H) 07/06/2018   HGBA1C 6.3 (A) 03/08/2018   HGBA1C 6.7 (H) 12/03/2017   Lab Results  Component Value Date   MICROALBUR <0.7 03/08/2018   CREATININE 0.83 07/06/2018   No other active problems: See review of systems  Allergies as of 07/12/2018      Reactions   Molds & Smuts Other (See Comments)      Medication List        Accurate as of 07/12/18  9:47 AM. Always use your most recent med list.          atorvastatin 40 MG tablet Commonly known as:  LIPITOR TAKE 1 TABLET BY  MOUTH EVERY DAY   glucose blood test strip Check glucose bid   INVOKANA 100 MG Tabs tablet Generic drug:  canagliflozin TAKE 1 TABLET BY MOUTH BEFORE BREAKFAST   metFORMIN 1000 MG tablet Commonly known as:  GLUCOPHAGE TAKE 1 TABLET (1,000 MG TOTAL) BY MOUTH 2 (TWO) TIMES DAILY WITH A MEAL.   OZEMPIC (0.25 OR 0.5 MG/DOSE) 2 MG/1.5ML Sopn Generic drug:  Semaglutide(0.25 or 0.5MG /DOS) INJECT 0.5 MG INTO THE SKIN ONCE A WEEK.   telmisartan 40 MG tablet Commonly known as:  MICARDIS TAKE 1 TABLET BY MOUTH EVERY DAY   Testosterone 20.25 MG/ACT (1.62%) Gel APPLY 2 PUMPS ON ONE SHOULDER AND ONE ON THE OTHER MORNING ALLOW TO DRY BEFORE DRESSING       Allergies:  Allergies  Allergen Reactions  . Molds & Smuts Other (See Comments)    Past Medical History:  Diagnosis Date  . 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  . Adenomatous colon polyp 05/07/06; 05/2012   Repeat colonoscopy 05/17/2012 showed 2 small polyps that were removed--path showed Hyperplastic-not adenomatous.  Still needs repeat 5 yrs.  . Angioedema of lips    saw allergist 11/2011  . Asthmatic bronchitis , chronic ([place]) 01/11/2012   Question occupational asthma with no specific agents identified. He does notice that some areas of his job are particularly dusty and dirty. Currently he is well controlled using Symbicort through an AeroChamber and rarely needing his rescue inhaler. This would be an acceptable long-term status. Elevated nonspecific total IgE and peripheral eosinophilia to suggest an atopic trigger with sensitization to something Consider Daliresp for future use if needed   . Chronic fatigue    +daytime somnolence  . Chronic recurrent sinusitis 02/28/2012  . Depression    Prozac in the past not much help.  Spontaneously resolved.  . Diabetes mellitus without complication ([place])    managed by Dr. Dwyane Dee (endo)--this is accurate as of 02/2018,  when pt last saw Dr. Dwyane Dee.  . Diverticulosis   . Generalized anxiety disorder 08/11/2012  . Hemorrhoids    ext and int  . Hyperlipemia, mixed    elev trigs with low HDL. Fish oil OTC.  Statin started in Fall 2018.  Marland Kitchen Hypertension 2017  . Hypogonadotropic hypogonadism ([place]) 04/08/2011   As of 2017/18/19, Dr. Dwyane Dee (endo) is following: he started clomiph but no better at f/u 11/2016.  Pt denied any improvemt,though testost improved.  11/2017 Clomiphene d/c'd-topical testo started. His hypogonadism is due to insulin resistance syndrome.  As of 08/2017 endo f/u: pt staying on clomiphene (no testost) and is feeling some improved.  No imp 11/2017 f/u on topical testost (off clom)  . Insomnia   . Internal hemorrhoids, bleeding and sicharge 05/29/2013  . OSA on CPAP  setting changed to 6 cm H20 (fixed) 10/2016 by pulm.  . Rotator cuff tear 2011   [facility]: conservative mgmt--symptoms stable as of 04/2011.    Past Surgical History:  Procedure Laterality Date  . adnoids    . CARDIOVASCULAR STRESS TEST     Normal stress nuclear study, EF normal. +Hypertensive bp response--subsequent 24H ambulatory BP monitoring confirmed HTN.  . COLONOSCOPY  05/2006, 05/2012  . HEMORRHOID BANDING    . POLYPECTOMY     Adenomatous 2007; hyperplastic 2013.  Repeat 5 yrs.  . TRANSTHORACIC ECHOCARDIOGRAM  01/30/16   Normal except mild aortic dilatation  . WISDOM TOOTH EXTRACTION      Family History  Problem Relation Age of Onset  . Hypertension Mother   . Diabetes Mother   . CVA Mother   . Hypertension Father   . Diabetes Father   . Dementia Father   . Heart disease Father   . CAD Father     Social History:  reports that he has never smoked. He has never used smokeless tobacco. He reports that he drinks alcohol. He reports that he does not use drugs.  Review of Systems  HYPERLIPIDEMIA: Has been followed by PCP Only on Lipitor and triglycerides are persistently high He says that he is usually not  having improved triglycerides unless he is doing vigorous exercise and losing weight LDL controlled  Lab Results  Component Value Date   CHOL 105 07/06/2018   CHOL 88 03/08/2018   CHOL 107 07/20/2017   Lab Results  Component Value Date   HDL 29.10 (L) 07/06/2018   HDL 25.70 (L) 03/08/2018   HDL 24.90 (L) 07/20/2017   No results found for: [facility] Lab Results  Component Value Date   TRIG 330.0 (H) 07/06/2018   TRIG 395.0 (H) 03/08/2018   TRIG 338.0 (H) 07/20/2017   Lab Results  Component Value Date   CHOLHDL 4 07/06/2018   CHOLHDL 3 03/08/2018   CHOLHDL 4 07/20/2017   Lab Results  Component Value Date   LDLDIRECT 42.0 07/06/2018   LDLDIRECT 33.0 03/08/2018   LDLDIRECT 36.0 07/20/2017    HYPERTENSION:    Currently being treated by PCP   Because of difficulty with backorder he is now taking Micardis instead of Avapro and he thinks this is causing diarrhea  He says that he had good blood pressure readings at [name], 125-135/<80  Continues to be on Invokana    BP Readings from Last 3 Encounters:  07/12/18 136/84  03/08/18 (!) 145/85  12/07/17 122/84      General Examination:   BP 136/84   Pulse 86   Ht 5\' 10"  (1.778 m)   Wt 203 lb (92.1 kg)   SpO2 97%   BMI 29.13 kg/m    Assessment/ Plan:    Hypogonadotropic hypogonadism, long-standing  This is secondary to insulin resistance syndrome  He continues to have fatigue which does not always correlate with his testosterone level He still having fatigue when he was taking clomiphene and levels were normal but now even with 4 pumps a day of the AndroGel his testosterone level has still stayed below normal Continues to have some fatigue He does not like the increased libido with the testosterone as he has ED  He is agreeable to going back to taking clomiphene 25 mg daily  HYPERLIPIDEMIA: Has persistently high triglycerides and will add fenofibrate Discussed regular exercise and weight loss also improved  insulin sensitivity  DIABETES type 2:  Her blood sugars are  reasonably well controlled although not as good as on the last visit On multiple medications including Ozempic to his Invokana and metformin A1c is relatively higher 6.8 compared to 6.3 His main difficulty is not exercising and not being able to lose weight and this was discussed He also needs to check his sugars more consistently after meals and bring his monitor for download Needs foot exam at next visit  HYPERTENSION:  He may be getting diarrhea from the new prescription for telmisartan he will switch to valsartan for now Otherwise he may have to switch to an ACE inhibitor, he will call PCP if needed  Total visit time for evaluation and management of multiple problems and counseling =25 minutes  Elayne Snare 07/12/2018, 9:47 AM

## 2018-07-12 NOTE — Patient Instructions (Addendum)
Walk daily  Check blood sugars on waking up 3 days a week  Also check blood sugars about 2 hours after meals and do this after different meals by rotation  Recommended blood sugar levels on waking up are 90-130 and about 2 hours after meal is 130-160  Please bring your blood sugar monitor to each visit, thank you  Cut Lipitor in 1/2

## 2018-07-25 DIAGNOSIS — G4733 Obstructive sleep apnea (adult) (pediatric): Secondary | ICD-10-CM | POA: Diagnosis not present

## 2018-08-06 ENCOUNTER — Other Ambulatory Visit: Payer: Self-pay | Admitting: Endocrinology

## 2018-08-13 ENCOUNTER — Other Ambulatory Visit: Payer: Self-pay | Admitting: Endocrinology

## 2018-08-15 ENCOUNTER — Other Ambulatory Visit: Payer: Self-pay | Admitting: Endocrinology

## 2018-08-15 NOTE — Telephone Encounter (Signed)
Please refill if appropriate

## 2018-08-25 DIAGNOSIS — G4733 Obstructive sleep apnea (adult) (pediatric): Secondary | ICD-10-CM | POA: Diagnosis not present

## 2018-09-20 ENCOUNTER — Other Ambulatory Visit: Payer: Self-pay

## 2018-09-20 ENCOUNTER — Telehealth: Payer: Self-pay | Admitting: Endocrinology

## 2018-09-20 MED ORDER — DAPAGLIFLOZIN PROPANEDIOL 10 MG PO TABS: 10.0000 mg | ORAL_TABLET | Freq: Every day | ORAL | 3 refills | Status: DC

## 2018-09-20 NOTE — Telephone Encounter (Signed)
Medication changed

## 2018-09-20 NOTE — Telephone Encounter (Signed)
Change Invokana to Farxiga 10 mg daily

## 2018-09-20 NOTE — Telephone Encounter (Signed)
Pt's insurance will not approve Invokana without first having tried [place] or [place].  Would you like to switch to one of these? If not, please provide clinical rational for PA.

## 2018-09-20 NOTE — Telephone Encounter (Signed)
°  [employer] is calling in regards to the PA for the patients medication. They would like to know if we needed any assistance or have questions to please call the number listed below  [place]- [phone] REF- [id number]  INVOKANA 100 MG TABS tablet

## 2018-09-24 DIAGNOSIS — G4733 Obstructive sleep apnea (adult) (pediatric): Secondary | ICD-10-CM | POA: Diagnosis not present

## 2018-10-09 ENCOUNTER — Other Ambulatory Visit: Payer: Self-pay | Admitting: Endocrinology

## 2018-10-11 ENCOUNTER — Telehealth: Payer: Self-pay

## 2018-10-11 NOTE — Telephone Encounter (Signed)
Douglas [name] (Key: [id number])  Rx #: [id number]  Invokana 100MG  tablets  Form [employer] Electronic PA Form (NCPDP)  Created  4 days ago  Sent to Plan  less than a minute ago  Determination  Wait for Questions [employer]_NCPDP typically responds with questions in less than 15 minutes, but may take up to 24 hours.

## 2018-10-24 DIAGNOSIS — G4733 Obstructive sleep apnea (adult) (pediatric): Secondary | ICD-10-CM | POA: Diagnosis not present

## 2018-10-31 ENCOUNTER — Encounter: Payer: Self-pay | Admitting: Family Medicine

## 2018-10-31 DIAGNOSIS — E119 Type 2 diabetes mellitus without complications: Secondary | ICD-10-CM | POA: Diagnosis not present

## 2018-10-31 LAB — HM DIABETES EYE EXAM

## 2018-11-03 ENCOUNTER — Other Ambulatory Visit: Payer: Self-pay | Admitting: Endocrinology

## 2018-11-03 DIAGNOSIS — E782 Mixed hyperlipidemia: Secondary | ICD-10-CM

## 2018-11-03 DIAGNOSIS — E1165 Type 2 diabetes mellitus with hyperglycemia: Secondary | ICD-10-CM

## 2018-11-03 DIAGNOSIS — E230 Hypopituitarism: Secondary | ICD-10-CM

## 2018-11-07 ENCOUNTER — Other Ambulatory Visit (INDEPENDENT_AMBULATORY_CARE_PROVIDER_SITE_OTHER): Payer: 59

## 2018-11-07 ENCOUNTER — Other Ambulatory Visit: Payer: Self-pay

## 2018-11-07 DIAGNOSIS — E230 Hypopituitarism: Secondary | ICD-10-CM | POA: Diagnosis not present

## 2018-11-07 DIAGNOSIS — E782 Mixed hyperlipidemia: Secondary | ICD-10-CM

## 2018-11-07 DIAGNOSIS — E1165 Type 2 diabetes mellitus with hyperglycemia: Secondary | ICD-10-CM

## 2018-11-07 LAB — CBC WITH DIFFERENTIAL/PLATELET
Basophils Absolute: 0.1 10*3/uL (ref 0.0–0.1)
Basophils Relative: 0.9 % (ref 0.0–3.0)
Eosinophils Absolute: 0.4 10*3/uL (ref 0.0–0.7)
Eosinophils Relative: 4.3 % (ref 0.0–5.0)
HCT: 48.6 % (ref 39.0–52.0)
Hemoglobin: 16.2 g/dL (ref 13.0–17.0)
LYMPHS ABS: 2.7 10*3/uL (ref 0.7–4.0)
Lymphocytes Relative: 32.8 % (ref 12.0–46.0)
MCHC: 33.2 g/dL (ref 30.0–36.0)
MCV: 84.1 fl (ref 78.0–100.0)
MONO ABS: 0.6 10*3/uL (ref 0.1–1.0)
Monocytes Relative: 7 % (ref 3.0–12.0)
Neutro Abs: 4.5 10*3/uL (ref 1.4–7.7)
Neutrophils Relative %: 55 % (ref 43.0–77.0)
Platelets: 283 10*3/uL (ref 150.0–400.0)
RBC: 5.78 Mil/uL (ref 4.22–5.81)
RDW: 14.4 % (ref 11.5–15.5)
WBC: 8.2 10*3/uL (ref 4.0–10.5)

## 2018-11-07 LAB — COMPREHENSIVE METABOLIC PANEL
ALT: 20 U/L (ref 0–53)
AST: 17 U/L (ref 0–37)
Albumin: 4.6 g/dL (ref 3.5–5.2)
Alkaline Phosphatase: 42 U/L (ref 39–117)
BILIRUBIN TOTAL: 0.5 mg/dL (ref 0.2–1.2)
BUN: 21 mg/dL (ref 6–23)
CO2: 27 mEq/L (ref 19–32)
Calcium: 9.7 mg/dL (ref 8.4–10.5)
Chloride: 102 mEq/L (ref 96–112)
Creatinine, Ser: 1.05 mg/dL (ref 0.40–1.50)
GFR: 72.15 mL/min (ref >60.00)
Glucose, Bld: 111 mg/dL — ABNORMAL HIGH (ref 70–99)
Potassium: 4.5 mEq/L (ref 3.5–5.1)
SODIUM: 138 meq/L (ref 135–145)
Total Protein: 7.2 g/dL (ref 6.0–8.3)

## 2018-11-07 LAB — HEMOGLOBIN A1C: Hgb A1c MFr Bld: 6.8 % — ABNORMAL HIGH (ref 4.6–6.5)

## 2018-11-07 LAB — LIPID PANEL
Cholesterol: 147 mg/dL (ref 0–200)
HDL: 26.3 mg/dL — ABNORMAL LOW (ref >39.00)
NonHDL: 120.66
Total CHOL/HDL Ratio: 6
Triglycerides: 290 mg/dL — ABNORMAL HIGH (ref 0.0–149.0)
VLDL: 58 mg/dL — ABNORMAL HIGH (ref 0.0–40.0)

## 2018-11-07 LAB — LUTEINIZING HORMONE: LH: 4.3 m[IU]/mL (ref 1.50–9.30)

## 2018-11-07 LAB — LDL CHOLESTEROL, DIRECT: Direct LDL: 79 mg/dL

## 2018-11-07 LAB — TESTOSTERONE: Testosterone: 279.8 ng/dL — ABNORMAL LOW (ref 300.00–890.00)

## 2018-11-10 ENCOUNTER — Encounter: Payer: Self-pay | Admitting: Endocrinology

## 2018-11-10 ENCOUNTER — Ambulatory Visit (INDEPENDENT_AMBULATORY_CARE_PROVIDER_SITE_OTHER): Payer: 59 | Admitting: Endocrinology

## 2018-11-10 ENCOUNTER — Other Ambulatory Visit: Payer: Self-pay

## 2018-11-10 VITALS — BP 140/84 | HR 94 | Ht 70.0 in | Wt 207.0 lb

## 2018-11-10 DIAGNOSIS — E230 Hypopituitarism: Secondary | ICD-10-CM | POA: Diagnosis not present

## 2018-11-10 DIAGNOSIS — E782 Mixed hyperlipidemia: Secondary | ICD-10-CM | POA: Diagnosis not present

## 2018-11-10 DIAGNOSIS — E119 Type 2 diabetes mellitus without complications: Secondary | ICD-10-CM

## 2018-11-10 MED ORDER — TESTOSTERONE 20.25 MG/ACT (1.62%) TD GEL: TRANSDERMAL | 3 refills | Status: DC

## 2018-11-10 NOTE — Progress Notes (Signed)
Patient ID: Douglas [name], male   DOB: 12-09-1960, 58 y.o.   MRN: [id number]           Chief complaint: Endocrinology follow-up  Referring physician: McGowan   History of Present Illness  PROBLEM 1:  Hypogonadism: This was diagnosed in 2009  He was apparently initially seen by his psychiatrist and was having difficulties with fatigue, agoraphobia and decreased libido as well as sexual dysfunction. He was told that his testosterone level was significantly low and was given testosterone supplements probably with AndroGel Subsequently the patient has been treated by various physicians including the urologist with testosterone supplements, mostly AndroGel He thinks that his testosterone levels have never been normal with AndroGel although he thinks that he felt a little better with taking this from his urologist who called him to apply it on his abdomen  He was also tried on monthly injections for about 3 months without consistent benefit also In 2016 he was given [place] again without any benefit   RECENT history: He had complaints offatigue, decreased motivation, decreased libido and erectile dysfunction on his initial consultation. Initial evaluation showed low free testosterone of 4.5 and normal LH   He had been on clomiphene 25 mg daily after his initial consultation in 09/2015  Because of continued fatigue, decreased libido and testosterone levels being on the low end of normal he was switched to ANDROGEL 3 pumps daily in 11/2017 However on his last visit in 11/19 he wanted to switch back to clomiphene as he thinks he was still having fatigue and did not care about change in his libido  Now he says that he felt more motivated and able to do more with testosterone gel even with low levels compared to clomiphene Complaining of some fatigue, lack of motivation and malaise   Labs show minimally better testosterone level but still below 300, currently on clomiphene only   His  lab results showtestosterone levels as follows:  Lab Results  Component Value Date   TESTOSTERONE 279.80 (L) 11/07/2018   TESTOSTERONE 271.07 (L) 07/06/2018   TESTOSTERONE 151.55 (L) 03/08/2018   TESTOSTERONE 321.59 12/03/2017     Lab Results  Component Value Date   LH 4.30 11/07/2018    DIABETES type II: He had significant hyperglycemia at onset with A1c about 13  Since he was not benefiting from Actos and metformin given by his PCP he was given Invokana in addition in August 2018 Since 07/2017 he has been on Ozempic  Non-insulin HYPOGLYCEMIC medication regimen: Farxiga 10 mg daily, metformin 1000 mg twice daily and Ozempic 0.5 mg weekly  Baseline A1c was 7.8 and is now stable at 6.8  Current management, problems identified and blood sugar patterns:  He thinks his blood sugars are excellent at [name] but ran out of test strips 3 to 4 weeks ago  His blood sugars may sometimes be checked after meals  He does not bring his monitor for download despite reminders  He has not done any regular exercise and occasionally some yard work  He has been compliant with his medications  His last consultation with dietitian was in 2016  Blood sugars at [name]: Range 94-134     Wt Readings from Last 3 Encounters:  11/10/18 207 lb (93.9 kg)  07/12/18 203 lb (92.1 kg)  03/08/18 206 lb 6.4 oz (93.6 kg)     Lab Results  Component Value Date   HGBA1C 6.8 (H) 11/07/2018   HGBA1C 6.8 (H) 07/06/2018   HGBA1C 6.3 (A) 03/08/2018  Lab Results  Component Value Date   MICROALBUR <0.7 03/08/2018   CREATININE 1.05 11/07/2018   No other active problems: See review of systems  Allergies as of 11/10/2018      Reactions   Molds & Smuts Other (See Comments)      Medication List       Accurate as of November 10, 2018 10:42 AM. Always use your most recent med list.        atorvastatin 40 MG tablet Commonly known as:  LIPITOR TAKE 1 TABLET BY MOUTH EVERY DAY   dapagliflozin  propanediol 10 MG Tabs tablet Commonly known as:  Farxiga Take 10 mg by mouth daily. TAKE 1 TABLET BY MOUTH ONCE DAILY.   fenofibrate 145 MG tablet Commonly known as:  TRICOR TAKE 1 TABLET BY MOUTH EVERY DAY   glucose blood test strip Check glucose bid   metFORMIN 1000 MG tablet Commonly known as:  GLUCOPHAGE TAKE 1 TABLET (1,000 MG TOTAL) BY MOUTH 2 (TWO) TIMES DAILY WITH A MEAL.   Ozempic (0.25 or 0.5 MG/DOSE) 2 MG/1.5ML Sopn Generic drug:  Semaglutide(0.25 or 0.5MG /DOS) INJECT 0.5 MG INTO THE SKIN ONCE A WEEK.   valsartan 160 MG tablet Commonly known as:  DIOVAN TAKE 1 TABLET BY MOUTH EVERY DAY       Allergies:  Allergies  Allergen Reactions  . Molds & Smuts Other (See Comments)    Past Medical History:  Diagnosis Date  . 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  . Adenomatous colon polyp 05/07/06; 05/2012   Repeat colonoscopy 05/17/2012 showed 2 small polyps that were removed--path showed Hyperplastic-not adenomatous.  Still needs repeat 5 yrs.  . Angioedema of lips    saw allergist 11/2011  . Asthmatic bronchitis , chronic ([place]) 01/11/2012   Question occupational asthma with no specific agents identified. He does notice that some areas of his job are particularly dusty and dirty. Currently he is well controlled using Symbicort through an AeroChamber and rarely needing his rescue inhaler. This would be an acceptable long-term status. Elevated nonspecific total IgE and peripheral eosinophilia to suggest an atopic trigger with sensitization to something Consider Daliresp for future use if needed   . Chronic fatigue    +daytime somnolence  . Chronic recurrent sinusitis 02/28/2012  . Depression    Prozac in the past not much help.  Spontaneously resolved.  . Diabetes mellitus without complication ([place])    managed by Dr. Dwyane Dee (endo)--this is accurate as of 02/2018, when pt last saw Dr. Dwyane Dee.  .  Diverticulosis   . Generalized anxiety disorder 08/11/2012  . Hemorrhoids    ext and int  . Hyperlipemia, mixed    elev trigs with low HDL. Fish oil OTC.  Statin started in Fall 2018.  Marland Kitchen Hypertension 2017  . Hypogonadotropic hypogonadism ([place]) 04/08/2011   As of 2017/18/19, Dr. Dwyane Dee (endo) is following: he started clomiph but no better at f/u 11/2016.  Pt denied any improvemt,though testost improved.  11/2017 Clomiphene d/c'd-topical testo started. His hypogonadism is due to insulin resistance syndrome.  As of 08/2017 endo f/u: pt staying on clomiphene (no testost) and is feeling some improved.  No imp 11/2017 f/u on topical testost (off clom)  . Insomnia   . Internal hemorrhoids, bleeding and sicharge 05/29/2013  . OSA on CPAP    setting changed to 6 cm H20 (fixed) 10/2016 by pulm.  . Rotator cuff tear 2011  [facility]: conservative mgmt--symptoms stable as of 04/2011.    Past Surgical History:  Procedure Laterality Date  . adnoids    . CARDIOVASCULAR STRESS TEST     Normal stress nuclear study, EF normal. +Hypertensive bp response--subsequent 24H ambulatory BP monitoring confirmed HTN.  . COLONOSCOPY  05/2006, 05/2012  . HEMORRHOID BANDING    . POLYPECTOMY     Adenomatous 2007; hyperplastic 2013.  Repeat 5 yrs.  . TRANSTHORACIC ECHOCARDIOGRAM  01/30/16   Normal except mild aortic dilatation  . WISDOM TOOTH EXTRACTION      Family History  Problem Relation Age of Onset  . Hypertension Mother   . Diabetes Mother   . CVA Mother   . Hypertension Father   . Diabetes Father   . Dementia Father   . Heart disease Father   . CAD Father     Social History:  reports that he has never smoked. He has never used smokeless tobacco. He reports current alcohol use. He reports that he does not use drugs.  Review of Systems  HYPERLIPIDEMIA: Has been followed by PCP Only on Lipitor and fenofibrate, triglycerides are persistently high although relatively better  He says that he is usually  not getting improved triglycerides unless he is doing vigorous exercise and losing weight Recently has not done any exercise  LDL under 100  Lab Results  Component Value Date   CHOL 147 11/07/2018   CHOL 105 07/06/2018   CHOL 88 03/08/2018   Lab Results  Component Value Date   HDL 26.30 (L) 11/07/2018   HDL 29.10 (L) 07/06/2018   HDL 25.70 (L) 03/08/2018   No results found for: [facility] Lab Results  Component Value Date   TRIG 290.0 (H) 11/07/2018   TRIG 330.0 (H) 07/06/2018   TRIG 395.0 (H) 03/08/2018   Lab Results  Component Value Date   CHOLHDL 6 11/07/2018   CHOLHDL 4 07/06/2018   CHOLHDL 3 03/08/2018   Lab Results  Component Value Date   LDLDIRECT 79.0 11/07/2018   LDLDIRECT 42.0 07/06/2018   LDLDIRECT 33.0 03/08/2018    HYPERTENSION:    This is being treated by PCP   Previously had claimed that he had diarrhea from Micardis and now on valsartan but he has not taken this for 3 weeks as he did not renew the prescription  BP Readings from Last 3 Encounters:  11/10/18 140/84  07/12/18 136/84  03/08/18 (!) 145/85      General Examination:   BP 140/84 (BP Location: Left Arm, Patient Position: Sitting, Cuff Size: Normal)   Pulse 94   Ht 5\' 10"  (1.778 m)   Wt 207 lb (93.9 kg)   SpO2 98%   BMI 29.70 kg/m   Diabetic Foot Exam - Simple   No data filed      Assessment/ Plan:    Hypogonadotropic hypogonadism, long-standing  This is secondary to insulin resistance syndrome  He continues to have fatigue which does not always correlate with his testosterone level However now he thinks that his energy level is relatively better with testosterone gel compared to clomiphene even with the testosterone level being about the same in the upper 200 range Libido is not an issue He has more motivation reportedly from testosterone gel and he wants to go back to this He will use 2 pumps on each arm as before  We will need to recheck his levels on follow-up   HYPERLIPIDEMIA: Has persistently high triglycerides despite adding fenofibrate He does need to be  consistent with diet and exercise regimen and will recheck on the next visit  DIABETES type 2:  A1c is 6.8 with his multiple drug regimen Again discussed importance of regular exercise with combination of aerobic and weight training  Advised him to bring his monitor for download and do more readings after meals also  HYPERTENSION:  He will call his PCP for refill on his valsartan  Total visit time for evaluation and management of multiple problems and counseling =25 minutes  Elayne Snare 11/10/2018, 10:42 AM

## 2018-11-11 ENCOUNTER — Encounter: Payer: Self-pay | Admitting: Family Medicine

## 2018-11-11 ENCOUNTER — Ambulatory Visit: Payer: 59 | Admitting: Family Medicine

## 2018-11-11 ENCOUNTER — Other Ambulatory Visit: Payer: Self-pay

## 2018-11-11 VITALS — BP 134/85 | HR 93 | Temp 98.0°F | Resp 16 | Ht 70.0 in | Wt 206.8 lb

## 2018-11-11 DIAGNOSIS — I10 Essential (primary) hypertension: Secondary | ICD-10-CM

## 2018-11-11 MED ORDER — VALSARTAN 160 MG PO TABS: 160.0000 mg | ORAL_TABLET | Freq: Every day | ORAL | 3 refills | Status: DC

## 2018-11-11 NOTE — Progress Notes (Signed)
OFFICE VISIT  11/11/2018   CC:  Chief Complaint  Patient presents with  . Follow-up    [facility]     HPI:    Patient is a 58 y.o. Caucasian male who presents for 4 mo f/u HTN.  BP at home 120s-130s usually, diast <80.  HR typically 70-80s.   RAn out of diovan 4 d/a. He is getting a new job working for [employer].    Reviewed recent labs done 11/07/18 by endo, all stable.    Past Medical History:  Diagnosis Date  . 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  . Adenomatous colon polyp 05/07/06; 05/2012   Repeat colonoscopy 05/17/2012 showed 2 small polyps that were removed--path showed Hyperplastic-not adenomatous.  Still needs repeat 5 yrs.  . Angioedema of lips    saw allergist 11/2011  . Asthmatic bronchitis , chronic ([place]) 01/11/2012   Question occupational asthma with no specific agents identified. He does notice that some areas of his job are particularly dusty and dirty. Currently he is well controlled using Symbicort through an AeroChamber and rarely needing his rescue inhaler. This would be an acceptable long-term status. Elevated nonspecific total IgE and peripheral eosinophilia to suggest an atopic trigger with sensitization to something Consider Daliresp for future use if needed   . Chronic fatigue    +daytime somnolence  . Chronic recurrent sinusitis 02/28/2012  . Depression    Prozac in the past not much help.  Spontaneously resolved.  . Diabetes mellitus without complication ([place])    managed by Dr. Dwyane Dee (endo)--this is accurate as of 02/2018, when pt last saw Dr. Dwyane Dee.  . Diverticulosis   . Generalized anxiety disorder 08/11/2012  . Hemorrhoids    ext and int  . Hyperlipemia, mixed    elev trigs with low HDL. Fish oil OTC.  Statin started in Fall 2018.  Marland Kitchen Hypertension 2017  . Hypogonadotropic hypogonadism ([place]) 04/08/2011   As of 2017/18/19, Dr. Dwyane Dee (endo) is following: he started clomiph but no better  at f/u 11/2016.  Pt denied any improvemt,though testost improved.  11/2017 Clomiphene d/c'd-topical testo started. His hypogonadism is due to insulin resistance syndrome.  As of 08/2017 endo f/u: pt staying on clomiphene (no testost) and is feeling some improved.  No imp 11/2017 f/u on topical testost (off clom)  . Insomnia   . Internal hemorrhoids, bleeding and sicharge 05/29/2013  . OSA on CPAP    setting changed to 6 cm H20 (fixed) 10/2016 by pulm.  . Rotator cuff tear 2011   [facility]: conservative mgmt--symptoms stable as of 04/2011.    Past Surgical History:  Procedure Laterality Date  . adnoids    . CARDIOVASCULAR STRESS TEST     Normal stress nuclear study, EF normal. +Hypertensive bp response--subsequent 24H ambulatory BP monitoring confirmed HTN.  . COLONOSCOPY  05/2006, 05/2012  . HEMORRHOID BANDING    . POLYPECTOMY     Adenomatous 2007; hyperplastic 2013.  Repeat 5 yrs.  . TRANSTHORACIC ECHOCARDIOGRAM  01/30/16   Normal except mild aortic dilatation  . WISDOM TOOTH EXTRACTION      Outpatient Medications Prior to Visit  Medication Sig Dispense Refill  . dapagliflozin propanediol (FARXIGA) 10 MG TABS tablet Take 10 mg by mouth daily. TAKE 1 TABLET BY MOUTH ONCE DAILY. 30 tablet 3  . fenofibrate (TRICOR) 145 MG tablet TAKE 1 TABLET BY MOUTH EVERY DAY 90 tablet 1  . glucose blood  test strip Check glucose bid 100 each 12  . metFORMIN (GLUCOPHAGE) 1000 MG tablet TAKE 1 TABLET (1,000 MG TOTAL) BY MOUTH 2 (TWO) TIMES DAILY WITH A MEAL. 180 tablet 1  . OZEMPIC 0.25 or 0.5 MG/DOSE SOPN INJECT 0.5 MG INTO THE SKIN ONCE A WEEK. 4 pen 2  . Testosterone 20.25 MG/ACT (1.62%) GEL Apply 2 pumps on each shoulder area every morning.  Allow to dry before dressing 150 g 3  . valsartan (DIOVAN) 160 MG tablet TAKE 1 TABLET BY MOUTH EVERY DAY 90 tablet 1  . atorvastatin (LIPITOR) 40 MG tablet TAKE 1 TABLET BY MOUTH EVERY DAY (Patient not taking: Reported on 11/11/2018) 90 tablet 1   No  facility-administered medications prior to visit.     Allergies  Allergen Reactions  . Molds & Smuts Other (See Comments)    ROS As per HPI  PE: Blood pressure 134/85, pulse 93, temperature 98 F (36.7 C), temperature source Oral, resp. rate 16, height 5\' 10"  (1.778 m), weight 206 lb 12.8 oz (93.8 kg), SpO2 94 %. Body mass index is 29.67 kg/m.  Gen: Alert, well appearing.  Patient is oriented to person, place, time, and situation. AFFECT: pleasant, lucid thought and speech. No further exam today.  LABS:  Lab Results  Component Value Date   TSH 1.06 03/18/2017   Lab Results  Component Value Date   WBC 8.2 11/07/2018   HGB 16.2 11/07/2018   HCT 48.6 11/07/2018   MCV 84.1 11/07/2018   PLT 283.0 11/07/2018   Lab Results  Component Value Date   CREATININE 1.05 11/07/2018   BUN 21 11/07/2018   NA 138 11/07/2018   K 4.5 11/07/2018   CL 102 11/07/2018   CO2 27 11/07/2018   Lab Results  Component Value Date   ALT 20 11/07/2018   AST 17 11/07/2018   ALKPHOS 42 11/07/2018   BILITOT 0.5 11/07/2018   Lab Results  Component Value Date   CHOL 147 11/07/2018   Lab Results  Component Value Date   HDL 26.30 (L) 11/07/2018   No results found for: [facility] Lab Results  Component Value Date   TRIG 290.0 (H) 11/07/2018   Lab Results  Component Value Date   CHOLHDL 6 11/07/2018   Lab Results  Component Value Date   PSA 1.26 03/18/2017   PSA 0.89 06/03/2015   PSA 1.06 09/13/2013   Lab Results  Component Value Date   HGBA1C 6.8 (H) 11/07/2018    IMPRESSION AND PLAN:  HTN: good control.  Ran out of med 4 d/a. Refilled diovan today. Lytes/cr great 4 days ago at Dr. Ronnie Derby.  An After Visit Summary was printed and given to the patient.  FOLLOW UP: Return in about 6 months (around 05/14/2019) for annual CPE (fasting).  Signed:  Crissie Sickles, MD           11/11/2018

## 2018-11-23 DIAGNOSIS — G4733 Obstructive sleep apnea (adult) (pediatric): Secondary | ICD-10-CM | POA: Diagnosis not present

## 2018-12-05 ENCOUNTER — Telehealth: Payer: Self-pay

## 2018-12-05 MED ORDER — METFORMIN HCL 1000 MG PO TABS: 1000.0000 mg | ORAL_TABLET | Freq: Two times a day (BID) | ORAL | 1 refills | Status: DC

## 2018-12-05 NOTE — Telephone Encounter (Signed)
RF request for Metformin 1000mg  LOV: 07/19/17 Follow up [facility] Next ov: 02/10/19 Last written: 06/06/18 #180 w/ 1RF. Pt takes BID.  Dr.Kumar manages his DM but we do his Metformin. Please advise on refill.

## 2018-12-05 NOTE — Telephone Encounter (Signed)
Ok, metformin eRx'd.

## 2018-12-07 NOTE — Telephone Encounter (Signed)
LM advising pt med was sent in.

## 2018-12-16 NOTE — Telephone Encounter (Signed)
PA reinitiated due to it being outdated for Invokana. ZBF:[id number]

## 2018-12-21 ENCOUNTER — Encounter: Payer: Self-pay | Admitting: Family Medicine

## 2018-12-23 DIAGNOSIS — G4733 Obstructive sleep apnea (adult) (pediatric): Secondary | ICD-10-CM | POA: Diagnosis not present

## 2018-12-27 ENCOUNTER — Other Ambulatory Visit: Payer: Self-pay | Admitting: Endocrinology

## 2019-01-11 ENCOUNTER — Encounter: Payer: Self-pay | Admitting: Family Medicine

## 2019-01-12 ENCOUNTER — Other Ambulatory Visit: Payer: Self-pay | Admitting: Endocrinology

## 2019-01-24 DIAGNOSIS — G4733 Obstructive sleep apnea (adult) (pediatric): Secondary | ICD-10-CM | POA: Diagnosis not present

## 2019-02-07 ENCOUNTER — Other Ambulatory Visit: Payer: 59

## 2019-02-10 ENCOUNTER — Ambulatory Visit: Payer: 59 | Admitting: Endocrinology

## 2019-03-01 ENCOUNTER — Other Ambulatory Visit: Payer: Self-pay

## 2019-03-01 MED ORDER — OZEMPIC (0.25 OR 0.5 MG/DOSE) 2 MG/1.5ML ~~LOC~~ SOPN: 0.5000 mg | PEN_INJECTOR | SUBCUTANEOUS | 2 refills | Status: DC

## 2019-03-06 ENCOUNTER — Telehealth: Payer: Self-pay | Admitting: Family Medicine

## 2019-03-06 NOTE — Telephone Encounter (Signed)
Requesting Xanax 5mg   quantity #2  [place] of [place]. [place]  [address] [phone]  Please call [phone].

## 2019-03-13 ENCOUNTER — Encounter: Payer: Self-pay | Admitting: Family Medicine

## 2019-03-13 MED ORDER — ALPRAZOLAM 1 MG PO TABS: ORAL_TABLET | ORAL | 0 refills | Status: DC

## 2019-03-13 NOTE — Telephone Encounter (Signed)
OK, xanax eRx'd.

## 2019-03-13 NOTE — Telephone Encounter (Signed)
Pt has a hx of GAD and is requesting Xanax for flight. Last time Rx given was 2014.  Please advise, thanks.

## 2019-03-13 NOTE — Telephone Encounter (Signed)
MyChart message read.

## 2019-03-13 NOTE — Telephone Encounter (Signed)
I could have sworn that the message requested the [facility] to [facility] [place]. Pls call the [place] [facility] and cancel the alprazolam rx. I have [facility] new [facility] [place]thx

## 2019-03-13 NOTE — Addendum Note (Signed)
Addended by: Tammi Sou on: 03/13/2019 12:53 PM   Modules accepted: Orders

## 2019-04-11 ENCOUNTER — Other Ambulatory Visit: Payer: Self-pay | Admitting: Endocrinology

## 2019-05-06 ENCOUNTER — Other Ambulatory Visit: Payer: Self-pay | Admitting: Endocrinology

## 2019-05-10 ENCOUNTER — Telehealth: Payer: Self-pay | Admitting: Endocrinology

## 2019-05-10 NOTE — Telephone Encounter (Signed)
LVM to Schedule °

## 2019-05-10 NOTE — Telephone Encounter (Signed)
-----   Message from Elayne Snare, MD sent at 05/07/2019  9:46 PM EDT ----- Regarding: No appointment Please schedule labs and then follow-up for office visit

## 2019-05-12 ENCOUNTER — Encounter: Payer: Self-pay | Admitting: Endocrinology

## 2019-05-12 NOTE — Telephone Encounter (Signed)
Someone answered and disconnected.  Mailing Letter

## 2019-05-24 ENCOUNTER — Telehealth: Payer: Self-pay | Admitting: Family Medicine

## 2019-05-24 NOTE — Telephone Encounter (Signed)
Unable to work patient in until Friday unless pt declines. Any recommendations ?

## 2019-05-24 NOTE — Telephone Encounter (Signed)
Ice pack wrapped fairly tightly with ACE bandage for 20 min twice a day. TAke otc generic aleve 2 tabs twice a day with meals.-thx

## 2019-05-24 NOTE — Telephone Encounter (Signed)
Patient is requesting a same day appointment for a painful swollen left elbow.

## 2019-05-25 NOTE — Telephone Encounter (Signed)
Called patient and lvm with recommendations.

## 2019-05-26 ENCOUNTER — Emergency Department (HOSPITAL_BASED_OUTPATIENT_CLINIC_OR_DEPARTMENT_OTHER)
Admission: EM | Admit: 2019-05-26 | Discharge: 2019-05-26 | Disposition: A | Discharge status: Home / Self Care | Payer: 59 | Attending: Emergency Medicine | Admitting: Emergency Medicine

## 2019-05-26 ENCOUNTER — Encounter (HOSPITAL_BASED_OUTPATIENT_CLINIC_OR_DEPARTMENT_OTHER): Payer: Self-pay

## 2019-05-26 ENCOUNTER — Other Ambulatory Visit: Payer: Self-pay

## 2019-05-26 DIAGNOSIS — I10 Essential (primary) hypertension: Secondary | ICD-10-CM | POA: Diagnosis not present

## 2019-05-26 DIAGNOSIS — Y939 Activity, unspecified: Secondary | ICD-10-CM | POA: Insufficient documentation

## 2019-05-26 DIAGNOSIS — M7022 Olecranon bursitis, left elbow: Secondary | ICD-10-CM

## 2019-05-26 DIAGNOSIS — E119 Type 2 diabetes mellitus without complications: Secondary | ICD-10-CM | POA: Diagnosis not present

## 2019-05-26 DIAGNOSIS — Z79899 Other long term (current) drug therapy: Secondary | ICD-10-CM | POA: Insufficient documentation

## 2019-05-26 DIAGNOSIS — M25522 Pain in left elbow: Secondary | ICD-10-CM | POA: Diagnosis present

## 2019-05-26 NOTE — Discharge Instructions (Signed)
Continue your antibiotics at this time.  Continue to wear Ace wrap whenever you can to help with swelling.  Avoid repetitive activities.  Return to the emergency room if it becomes much worse over the weekend.

## 2019-05-26 NOTE — ED Provider Notes (Signed)
[facility] Provider Note   CSN: [id number] Arrival date & time: 05/26/19  1509     History   Chief Complaint Chief Complaint  Patient presents with  . Elbow Pain    HPI Douglas [name] is a 58 y.o. male.     Patient is a 58 year old male with a history of hypertension, diabetes who presents today with persistent pain and swelling of his left elbow.  Patient states symptoms started 5 days ago on Monday.  And they have gradually worsened until Wednesday when he went to urgent care and they diagnosed him with cellulitis and gave him clindamycin and he states a shot of something.  He states Wednesday night he felt really bad but yesterday and today has seemed a little bit better.  He has not had any systemic symptoms of fever or general malaise.  He has been able to bend and extend his arm without difficulty.  He denies any trauma but states that he does do repetitive movements.  He has never had anything like this before.  The history is provided by the patient.    Past Medical History:  Diagnosis Date  . 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  . Adenomatous colon polyp 05/07/06; 05/2012   Repeat colonoscopy 05/17/2012 showed 2 small polyps that were removed--path showed Hyperplastic-not adenomatous.  Still needs repeat 5 yrs.  . Angioedema of lips    saw allergist 11/2011  . Asthmatic bronchitis , chronic ([place]) 01/11/2012   Question occupational asthma with no specific agents identified. He does notice that some areas of his job are particularly dusty and dirty. Currently he is well controlled using Symbicort through an AeroChamber and rarely needing his rescue inhaler. This would be an acceptable long-term status. Elevated nonspecific total IgE and peripheral eosinophilia to suggest an atopic trigger with sensitization to something Consider Daliresp for future use if needed   .  Chronic fatigue    +daytime somnolence  . Chronic recurrent sinusitis 02/28/2012  . Depression    Prozac in the past not much help.  Spontaneously resolved.  . Diabetes mellitus without complication ([place])    managed by Dr. Dwyane Dee (endo)--this is accurate as of 02/2018, when pt last saw Dr. Dwyane Dee.  . Diverticulosis   . Generalized anxiety disorder 08/11/2012  . Hemorrhoids    ext and int  . Hyperlipemia, mixed    elev trigs with low HDL. Fish oil OTC.  Statin started in Fall 2018.  Fenofibrate started 07/2018  . Hypertension 2017  . Hypogonadotropic hypogonadism ([place]) 04/08/2011   As of 2017/18/19, Dr. Dwyane Dee (endo) is following: he started clomiph but no better at f/u 11/2016.  Pt denied any improvemt,though testost improved.  11/2017 Clomiphene d/c'd-topical testo started. His hypogonadism is due to insulin resistance syndrome.  As of 08/2017 endo f/u: pt staying on clomiphene (no testost) and is feeling some improved.  No imp 11/2017 f/u on topical testost (off clom)  . Insomnia   . Internal hemorrhoids, bleeding and sicharge 05/29/2013  . OSA on CPAP    setting changed to 6 cm H20 (fixed) 10/2016 by pulm.  . Rotator cuff tear 2011   [facility]: conservative mgmt--symptoms stable as of 04/2011.    Patient Active Problem List   Diagnosis Date Noted  . Mixed hyperlipidemia 03/22/2017  . OSA (obstructive sleep apnea) 04/27/2016  . Essential hypertension 04/14/2016  . Orthostatic hypotension 03/11/2016  .  Exercise intolerance 03/11/2016  . Diabetes mellitus without complication ([place]) 123XX123  . Anal fissure - posterior 11/28/2013  . Rectal bleeding 05/29/2013  . Internal hemorrhoids, bleeding and discharge 05/29/2013  . Angioedema of lips 03/13/2013  . Asthma with bronchitis 03/13/2013  . External hemorrhoids 09/28/2012  . Acrophobia   . Prostate cancer screening 04/24/2011  . Hypogonadism, male 04/08/2011    Past Surgical History:  Procedure Laterality Date  . adnoids    .  CARDIOVASCULAR STRESS TEST     Normal stress nuclear study, EF normal. +Hypertensive bp response--subsequent 24H ambulatory BP monitoring confirmed HTN.  . COLONOSCOPY  05/2006, 05/2012  . HEMORRHOID BANDING    . POLYPECTOMY     Adenomatous 2007; hyperplastic 2013.  Repeat 5 yrs.  . TRANSTHORACIC ECHOCARDIOGRAM  01/30/16   Normal except mild aortic dilatation  . WISDOM TOOTH EXTRACTION          Home Medications    Prior to Admission medications   Medication Sig Start Date End Date Taking? Authorizing Provider  ALPRAZolam Duanne Moron) 1 MG tablet 1 tab po q8h prn anxiety 03/13/19   McGowen, Adrian Blackwater, MD  FARXIGA 10 MG TABS tablet TAKE 1 TABLET BY MOUTH ONCE DAILY. 05/07/19   Elayne Snare, MD  fenofibrate (TRICOR) 145 MG tablet TAKE 1 TABLET BY MOUTH EVERY DAY 04/11/19   Elayne Snare, MD  glucose blood test strip Check glucose bid 09/18/16   McGowen, Adrian Blackwater, MD  metFORMIN (GLUCOPHAGE) 1000 MG tablet Take 1 tablet (1,000 mg total) by mouth 2 (two) times daily with a meal. 12/05/18   McGowen, Adrian Blackwater, MD  Semaglutide,0.25 or 0.5MG /DOS, (OZEMPIC, 0.25 OR 0.5 MG/DOSE,) 2 MG/1.5ML SOPN Inject 0.5 mg into the skin once a week. 03/01/19   Elayne Snare, MD  Testosterone 20.25 MG/ACT (1.62%) GEL Apply 2 pumps on each shoulder area every morning.  Allow to dry before dressing 11/10/18   Elayne Snare, MD  valsartan (DIOVAN) 160 MG tablet Take 1 tablet (160 mg total) by mouth daily. 11/11/18   McGowen, Adrian Blackwater, MD    Family History Family History  Problem Relation Age of [place]  . Hypertension Mother   . Diabetes Mother   . CVA Mother   . Hypertension Father   . Diabetes Father   . Dementia Father   . Heart disease Father   . CAD Father     Social History Social History   Tobacco Use  . Smoking status: Never Smoker  . Smokeless tobacco: Never Used  Substance Use Topics  . Alcohol use: Yes    Alcohol/week: 0.0 standard drinks    Comment: rarely  . Drug use: No     Allergies   Molds & smuts    Review of Systems Review of Systems  All other systems reviewed and are negative.    Physical Exam Updated Vital Signs BP 119/76 (BP Location: Right Arm)   Pulse 74   Temp 98 F (36.7 C) (Oral)   Resp 14   Ht 5\' 10"  (1.778 m)   Wt 93 kg   SpO2 97%   BMI 29.41 kg/m   Physical Exam Vitals signs and nursing note reviewed.  Constitutional:      General: He is not in acute distress.    Appearance: Normal appearance. He is normal weight.  HENT:     Head: Normocephalic.  Eyes:     Pupils: Pupils are equal, round, and reactive to light.  Cardiovascular:     Rate and Rhythm:  Normal rate.     Pulses: Normal pulses.  Pulmonary:     Effort: Pulmonary effort is normal.  Musculoskeletal:     Left elbow: He exhibits swelling. He exhibits no deformity. Tenderness found. Olecranon process tenderness noted.       Arms:  Skin:    General: Skin is warm.     Capillary Refill: Capillary refill takes less than 2 seconds.  Neurological:     General: No focal deficit present.     Mental Status: He is alert. Mental status is at baseline.  Psychiatric:        Mood and Affect: Mood normal.        Behavior: Behavior normal.        Thought Content: Thought content normal.      ED Treatments / Results  Labs (all labs ordered are listed, but only abnormal results are displayed) Labs Reviewed  BODY FLUID CULTURE    EKG None  Radiology No results found.  Procedures .Joint Aspiration/Arthrocentesis  Date/Time: 05/26/2019 4:51 PM Performed by: Blanchie Dessert, MD Authorized by: Blanchie Dessert, MD   Consent:    Consent obtained:  Verbal   Consent given by:  Patient   Risks discussed:  Bleeding, infection and pain   Alternatives discussed:  No treatment Location:    Location:  Elbow   Elbow:  L elbow Anesthesia (see MAR for exact dosages):    Anesthesia method:  None Procedure details:    Preparation: Patient was prepped and draped in usual sterile fashion     Needle  gauge:  20 G   Ultrasound guidance: no     Aspirate amount:  44mL of fluid from the olecranon bursa   Aspirate characteristics:  Yellow and blood-tinged   Specimen collected: yes   Post-procedure details:    Dressing:  Adhesive bandage   Patient tolerance of procedure:  Tolerated well, no immediate complications   (including critical care time)  Medications Ordered in ED Medications - No data to display   Initial Impression / Assessment and Plan / ED Course  I have reviewed the triage vital signs and the nursing notes.  Pertinent labs & imaging results that were available during my care of the patient were reviewed by me and considered in my medical decision making (see chart for details).        Patient is a 58 year old male presenting with what appears to be olecranon bursitis.  Patient does have surrounding erythema and warmth.  Patient has no systemic symptoms but is diabetic.  He is now been on clindamycin for 2 days and feels like his symptoms are not getting any worse and have gotten a little bit better.  Aspiration of the olecranon bursa with 2 mL's of fluid that was yellow in appearance.  We will send this for culture.  Ace wrap was applied and patient was given follow-up with sports medicine on Monday if not better.  He was encouraged to continue the antibiotics however also recommended doing anti-inflammatories.  Final Clinical Impressions(s) / ED Diagnoses   Final diagnoses:  Olecranon bursitis of left elbow    ED Discharge Orders    None       Blanchie Dessert, MD 05/26/19 1655

## 2019-05-26 NOTE — ED Notes (Signed)
Wound culture obtained by Dr. Maryan Rued, [place].

## 2019-05-26 NOTE — ED Triage Notes (Addendum)
Pt c/o pain, swelling to left elbow 9/20-seen at [facility] 9/23-states he was dx with cellulitis- started on abx and given steroid inj and advised to come to ED today if not better-pt states area is not better-NAD-steady gait

## 2019-05-29 LAB — BODY FLUID CULTURE: Special Requests: NORMAL

## 2019-05-30 ENCOUNTER — Telehealth: Payer: Self-pay | Admitting: *Deleted

## 2019-05-30 NOTE — Telephone Encounter (Signed)
Post ED Visit - Positive Culture Follow-up: Successful Patient Follow-Up  Culture assessed and recommendations reviewed by:  []  Elenor Quinones, Pharm.D. []  Heide Guile, Pharm.D., BCPS AQ-ID []  Parks Neptune, Pharm.D., BCPS []  Alycia Rossetti, Pharm.D., BCPS []  [place], Pharm.D., BCPS, AAHIVP []  Legrand Como, Pharm.D., BCPS, AAHIVP []  Salome Arnt, PharmD, BCPS []  Johnnette Gourd, PharmD, BCPS []  Hughes Better, PharmD, BCPS []  Leeroy Cha, PharmD  Positive body fluid culture  []  Patient discharged without antimicrobial prescription and treatment is now indicated [x]  Organism is resistant to prescribed ED discharge antimicrobial []  Patient with positive blood cultures  Changes discussed with ED provider: Margarita Mail, PA New antibiotic prescription Keflex 500mg  PO QID x 5 days, stop Clindamycin [facility] [phone]  Contacted patient, date 09-29/2020, time [place] 05/30/2019, 11:04 AM

## 2019-05-30 NOTE — Progress Notes (Signed)
ED Antimicrobial Stewardship Positive Culture Follow Up   Douglas [name] is an 58 y.o. male who presented to [facility] on 05/26/2019 with a chief complaint of  Chief Complaint  Patient presents with  . Elbow Pain    Recent Results (from the past 720 hour(s))  Body fluid culture     Status: None   Collection Time: 05/26/19  4:48 PM   Specimen: Bursa/Synovial Cyst; Body Fluid  Result Value Ref Range Status   Specimen Description   Final    JOINT FLUID LEFT ELBOW Performed at [facility], [address] [place], [place] [address]    Special Requests   Final    Normal Performed at [facility], [address] [place], [place] [address]    Gram Stain   Final    ABUNDANT WBC PRESENT, PREDOMINANTLY PMN FEW GRAM POSITIVE COCCI Performed at [facility], [place] [address] [place], [place] [address]    Culture FEW STAPHYLOCOCCUS AUREUS  Final   Report Status 05/29/2019 FINAL  Final   Organism ID, Bacteria STAPHYLOCOCCUS AUREUS  Final      Susceptibility   Staphylococcus aureus - MIC*    CIPROFLOXACIN <=0.5 SENSITIVE Sensitive     ERYTHROMYCIN RESISTANT Resistant     GENTAMICIN <=0.5 SENSITIVE Sensitive     OXACILLIN 0.5 SENSITIVE Sensitive     TETRACYCLINE <=1 SENSITIVE Sensitive     VANCOMYCIN <=0.5 SENSITIVE Sensitive     TRIMETH/SULFA <=10 SENSITIVE Sensitive     CLINDAMYCIN RESISTANT Resistant     RIFAMPIN <=0.5 SENSITIVE Sensitive     Inducible Clindamycin POSITIVE Resistant     * FEW STAPHYLOCOCCUS AUREUS    [x]  Treated with clindamycin from previous visit, organism resistant to prescribed antimicrobial []  Patient discharged originally without antimicrobial agent and treatment is now indicated  New antibiotic prescription: DC clindamycin, start cephalexin 500mg  PO QID x 5 days  ED Provider: Margarita Mail, PA   [name], Rande Lawman 05/30/2019, 9:38 AM Clinical Pharmacist Monday - Friday phone -  [phone] Saturday - Sunday  phone - [phone]

## 2019-06-01 ENCOUNTER — Other Ambulatory Visit: Payer: Self-pay | Admitting: Endocrinology

## 2019-06-04 ENCOUNTER — Other Ambulatory Visit: Payer: Self-pay | Admitting: Family Medicine

## 2019-06-07 ENCOUNTER — Other Ambulatory Visit: Payer: Self-pay

## 2019-06-07 ENCOUNTER — Encounter: Payer: Self-pay | Admitting: Family Medicine

## 2019-06-07 ENCOUNTER — Ambulatory Visit: Payer: Self-pay

## 2019-06-07 ENCOUNTER — Ambulatory Visit: Payer: 59 | Admitting: Family Medicine

## 2019-06-07 VITALS — BP 106/71 | HR 83 | Ht 70.0 in | Wt 212.0 lb

## 2019-06-07 DIAGNOSIS — M7022 Olecranon bursitis, left elbow: Secondary | ICD-10-CM

## 2019-06-07 DIAGNOSIS — M25522 Pain in left elbow: Secondary | ICD-10-CM | POA: Diagnosis not present

## 2019-06-07 MED ORDER — COLCHICINE 0.6 MG PO TABS: 0.6000 mg | ORAL_TABLET | Freq: Two times a day (BID) | ORAL | 2 refills | Status: DC

## 2019-06-07 NOTE — Progress Notes (Signed)
Douglas [name] - 58 y.o. male MRN [id number]  Date of birth: 1960/09/29  SUBJECTIVE:  Including CC & ROS.  Chief Complaint  Patient presents with  . Elbow Pain    left elbow x 2 weeks    Douglas [name] is a 58 y.o. male that is presenting with acute left elbow pain.  It is occurring on the posterior aspect of the elbow.  The pain is ongoing.  It is intermittent in nature.  Is localized to this area.  It ranges from mild to severe.  He has some redness and heat associated with it.  He has been placed on clindamycin.  He had a aspiration and culture obtained on 9/25.  His culture returned staph aureus that was resistant to clindamycin.  This was then changed to Keflex.  The culture and antibiotic were returned and changed on 9/29.  He continues to have redness and swelling and pain in this area after completing the antibiotics.   Review of Systems  Constitutional: Negative for fever.  HENT: Negative for congestion.   Respiratory: Negative for cough.   Cardiovascular: Negative for chest pain.  Gastrointestinal: Negative for abdominal pain.  Musculoskeletal: Positive for myalgias.  Skin: Positive for color change.  Neurological: Negative for weakness.  Hematological: Negative for adenopathy.    HISTORY: Past Medical, Surgical, Social, and Family History Reviewed & Updated per EMR.   Pertinent Historical Findings include:  Past Medical History:  Diagnosis Date  . 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  . Adenomatous colon polyp 05/07/06; 05/2012   Repeat colonoscopy 05/17/2012 showed 2 small polyps that were removed--path showed Hyperplastic-not adenomatous.  Still needs repeat 5 yrs.  . Angioedema of lips    saw allergist 11/2011  . Asthmatic bronchitis , chronic ([place]) 01/11/2012   Question occupational asthma with no specific agents identified. He does notice that some areas of his job are particularly dusty  and dirty. Currently he is well controlled using Symbicort through an AeroChamber and rarely needing his rescue inhaler. This would be an acceptable long-term status. Elevated nonspecific total IgE and peripheral eosinophilia to suggest an atopic trigger with sensitization to something Consider Daliresp for future use if needed   . Chronic fatigue    +daytime somnolence  . Chronic recurrent sinusitis 02/28/2012  . Depression    Prozac in the past not much help.  Spontaneously resolved.  . Diabetes mellitus without complication ([place])    managed by Dr. Dwyane Dee (endo)--this is accurate as of 02/2018, when pt last saw Dr. Dwyane Dee.  . Diverticulosis   . Generalized anxiety disorder 08/11/2012  . Hemorrhoids    ext and int  . Hyperlipemia, mixed    elev trigs with low HDL. Fish oil OTC.  Statin started in Fall 2018.  Fenofibrate started 07/2018  . Hypertension 2017  . Hypogonadotropic hypogonadism ([place]) 04/08/2011   As of 2017/18/19, Dr. Dwyane Dee (endo) is following: he started clomiph but no better at f/u 11/2016.  Pt denied any improvemt,though testost improved.  11/2017 Clomiphene d/c'd-topical testo started. His hypogonadism is due to insulin resistance syndrome.  As of 08/2017 endo f/u: pt staying on clomiphene (no testost) and is feeling some improved.  No imp 11/2017 f/u on topical testost (off clom)  . Insomnia   . Internal hemorrhoids, bleeding and sicharge 05/29/2013  . OSA on CPAP    setting changed to 6 cm H20 (fixed) 10/2016 by pulm.  Marland Kitchen  Rotator cuff tear 2011   [facility]: conservative mgmt--symptoms stable as of 04/2011.    Past Surgical History:  Procedure Laterality Date  . adnoids    . CARDIOVASCULAR STRESS TEST     Normal stress nuclear study, EF normal. +Hypertensive bp response--subsequent 24H ambulatory BP monitoring confirmed HTN.  . COLONOSCOPY  05/2006, 05/2012  . HEMORRHOID BANDING    . POLYPECTOMY     Adenomatous 2007; hyperplastic 2013.  Repeat 5 yrs.  . TRANSTHORACIC  ECHOCARDIOGRAM  01/30/16   Normal except mild aortic dilatation  . WISDOM TOOTH EXTRACTION      Allergies  Allergen Reactions  . Molds & Smuts Other (See Comments)    Family History  Problem Relation Age of [place]  . Hypertension Mother   . Diabetes Mother   . CVA Mother   . Hypertension Father   . Diabetes Father   . Dementia Father   . Heart disease Father   . CAD Father      Social History   Socioeconomic History  . Marital status: Married    Spouse name: Not on file  . Number of children: 1  . Years of education: Not on file  . Highest education level: Not on file  Occupational History  . Occupation: Arts development officer: [employer]  . Financial resource strain: Not on file  . Food insecurity    Worry: Not on file    Inability: Not on file  . Transportation needs    Medical: Not on file    Non-medical: Not on file  Tobacco Use  . Smoking status: Never Smoker  . Smokeless tobacco: Never Used  Substance and Sexual Activity  . Alcohol use: Yes    Alcohol/week: 0.0 standard drinks    Comment: rarely  . Drug use: No  . Sexual activity: Not on file  Lifestyle  . Physical activity    Days per week: Not on file    Minutes per session: Not on file  . Stress: Not on file  Relationships  . Social Herbalist on phone: Not on file    Gets together: Not on file    Attends religious service: Not on file    Active member of club or organization: Not on file    Attends meetings of clubs or organizations: Not on file    Relationship status: Not on file  . Intimate partner violence    Fear of current or ex partner: Not on file    Emotionally abused: Not on file    Physically abused: Not on file    Forced sexual activity: Not on file  Other Topics Concern  . Not on file  Social History Narrative   Married, one 20 y/o daughter, wife is [place].   Grew up in [place] in [place] family (four sisters).   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   No T/A/Ds.  Works out regularly.     PHYSICAL EXAM:  VS: BP 106/71   Pulse 83   Ht 5\' 10"  (1.778 m)   Wt 212 lb (96.2 kg)   BMI 30.42 kg/m  Physical Exam Gen: NAD, alert, cooperative with exam, well-appearing ENT: normal lips, normal nasal mucosa,  Eye: normal EOM, normal conjunctiva and lids CV:  no edema, +2 pedal pulses   Resp: no accessory muscle use, non-labored,  Skin: no rashes, no areas of induration  Neuro: normal tone, normal sensation  to touch Psych:  normal insight, alert and oriented MSK:  Left elbow: Redness and swelling over the olecranon. Has some limitation with extension. Normal flexion. Normal strength resistance to resistance with flexion and extension. Normal strength resistance with pronation and supination. Neurovascular intact  Limited ultrasound: Left elbow:  Normal appearance at the insertion of the triceps tendon. Mild olecranon bursitis with small effusion.  Appears to have hyperechoic density to suggest gout. There is cobblestoning in the soft tissue suggest soft tissue swelling. There does not appear to be a joint effusion  Summary: Findings suggestive of gout olecranon bursitis  Ultrasound and interpretation by Clearance Coots, MD    ASSESSMENT & PLAN:   Olecranon bursitis, left elbow Possibly related to gout.  He has been on clindamycin as well as Keflex.  We will try to treat for gout. -Colchicine. -Counseled on supportive care -If no improvement with the colchicine they would consider being seen in the emergency department for possible septic joint

## 2019-06-07 NOTE — Assessment & Plan Note (Signed)
Possibly related to gout.  He has been on clindamycin as well as Keflex.  We will try to treat for gout. -Colchicine. -Counseled on supportive care -If no improvement with the colchicine they would consider being seen in the emergency department for possible septic joint

## 2019-06-07 NOTE — Patient Instructions (Signed)
Nice to meet you Please try compression and ice  Please try the new medicine. Please let me know if you are not having improvement after a couple of days.   Please send me a message in MyChart with any questions or updates.  Please see me back in 1 week if no better. If you're better then as needed.   --Dr. Raeford Razor

## 2019-06-08 ENCOUNTER — Encounter (HOSPITAL_BASED_OUTPATIENT_CLINIC_OR_DEPARTMENT_OTHER): Payer: Self-pay

## 2019-06-08 ENCOUNTER — Emergency Department (HOSPITAL_BASED_OUTPATIENT_CLINIC_OR_DEPARTMENT_OTHER)
Admission: EM | Admit: 2019-06-08 | Discharge: 2019-06-08 | Disposition: A | Discharge status: Home / Self Care | Payer: 59 | Attending: Emergency Medicine | Admitting: Emergency Medicine

## 2019-06-08 ENCOUNTER — Other Ambulatory Visit: Payer: Self-pay

## 2019-06-08 ENCOUNTER — Ambulatory Visit: Payer: 59 | Admitting: Family Medicine

## 2019-06-08 DIAGNOSIS — E782 Mixed hyperlipidemia: Secondary | ICD-10-CM | POA: Diagnosis not present

## 2019-06-08 DIAGNOSIS — I10 Essential (primary) hypertension: Secondary | ICD-10-CM | POA: Diagnosis not present

## 2019-06-08 DIAGNOSIS — Z91048 Other nonmedicinal substance allergy status: Secondary | ICD-10-CM | POA: Insufficient documentation

## 2019-06-08 DIAGNOSIS — Y939 Activity, unspecified: Secondary | ICD-10-CM | POA: Insufficient documentation

## 2019-06-08 DIAGNOSIS — M7022 Olecranon bursitis, left elbow: Secondary | ICD-10-CM | POA: Diagnosis not present

## 2019-06-08 DIAGNOSIS — E119 Type 2 diabetes mellitus without complications: Secondary | ICD-10-CM | POA: Diagnosis not present

## 2019-06-08 DIAGNOSIS — M25522 Pain in left elbow: Secondary | ICD-10-CM | POA: Diagnosis present

## 2019-06-08 MED ORDER — SULFAMETHOXAZOLE-TRIMETHOPRIM 800-160 MG PO TABS: 1.0000 | ORAL_TABLET | Freq: Two times a day (BID) | ORAL | 0 refills | Status: AC

## 2019-06-08 MED ORDER — SULFAMETHOXAZOLE-TRIMETHOPRIM 800-160 MG PO TABS
1.0000 | ORAL_TABLET | Freq: Once | ORAL | Status: AC
  Administered 2019-06-08: 1 via ORAL
  Filled 2019-06-08: qty 1

## 2019-06-08 MED ORDER — OXYCODONE-ACETAMINOPHEN 5-325 MG PO TABS
1.0000 | ORAL_TABLET | Freq: Once | ORAL | Status: AC
  Administered 2019-06-08: 03:00:00 1 via ORAL
  Filled 2019-06-08: qty 1

## 2019-06-08 MED ORDER — OXYCODONE-ACETAMINOPHEN 5-325 MG PO TABS: 2.0000 | ORAL_TABLET | ORAL | 0 refills | Status: DC | PRN

## 2019-06-08 NOTE — ED Provider Notes (Signed)
Emergency Department Provider Note   I have reviewed the triage vital signs and the nursing notes.   HISTORY  Chief Complaint Elbow Pain   HPI Douglas [name] is a 58 y.o. male who presents to the emergency department today secondary to persistent left elbow pain.  Patient was seen here 9/25 and ultimately was found to have infected bursitis.  Symptoms did not improve even when antibiotics were switched so he followed up with sports medicine today who told he might have gout versus a septic joint and if symptoms did not improve he needed to come to emergency room to be evaluated for same.  Patient without any fever, nausea, vomiting or other associated symptoms.  States that it hurts pretty bad throughout the day today worse with movement.  Patient states is on the back of his elbow not really in his elbow itself.  States the swelling and tenderness has been progressively worsening for last few days.  No other associated or modifying symptoms.    Past Medical History:  Diagnosis Date  . 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  . Adenomatous colon polyp 05/07/06; 05/2012   Repeat colonoscopy 05/17/2012 showed 2 small polyps that were removed--path showed Hyperplastic-not adenomatous.  Still needs repeat 5 yrs.  . Angioedema of lips    saw allergist 11/2011  . Asthmatic bronchitis , chronic ([place]) 01/11/2012   Question occupational asthma with no specific agents identified. He does notice that some areas of his job are particularly dusty and dirty. Currently he is well controlled using Symbicort through an AeroChamber and rarely needing his rescue inhaler. This would be an acceptable long-term status. Elevated nonspecific total IgE and peripheral eosinophilia to suggest an atopic trigger with sensitization to something Consider Daliresp for future use if needed   . Chronic fatigue    +daytime somnolence  . Chronic  recurrent sinusitis 02/28/2012  . Depression    Prozac in the past not much help.  Spontaneously resolved.  . Diabetes mellitus without complication ([place])    managed by Dr. Dwyane Dee ([facility])--this is accurate as of 02/2018, when pt last saw Dr. Dwyane Dee.  . Diverticulosis   . Generalized anxiety disorder 08/11/2012  . Hemorrhoids    ext and int  . Hyperlipemia, mixed    elev trigs with low HDL. Fish oil OTC.  Statin started in Fall 2018.  Fenofibrate started 07/2018  . Hypertension 2017  . Hypogonadotropic hypogonadism ([place]) 04/08/2011   As of 2017/18/19, Dr. Dwyane Dee ([facility]) is following: he started clomiph but no better at f/u 11/2016.  Pt denied any improvemt,though testost improved.  11/2017 Clomiphene d/c'd-topical testo started. His hypogonadism is due to insulin resistance syndrome.  As of 08/2017 endo f/u: pt staying on clomiphene (no testost) and is feeling some improved.  No imp 11/2017 f/u on topical testost (off clom)  . Insomnia   . Internal hemorrhoids, bleeding and sicharge 05/29/2013  . OSA on CPAP    setting changed to 6 cm H20 (fixed) 10/2016 by pulm.  . Rotator cuff tear 2011   [facility]: conservative mgmt--symptoms stable as of 04/2011.    Patient Active Problem List   Diagnosis Date Noted  . Olecranon bursitis, left elbow 06/07/2019  . Mixed hyperlipidemia 03/22/2017  . OSA (obstructive sleep apnea) 04/27/2016  . Essential hypertension 04/14/2016  . Orthostatic hypotension 03/11/2016  . Exercise intolerance 03/11/2016  . Diabetes mellitus without complication ([place]) 123XX123  .  Anal fissure - posterior 11/28/2013  . Rectal bleeding 05/29/2013  . Internal hemorrhoids, bleeding and discharge 05/29/2013  . Angioedema of lips 03/13/2013  . Asthma with bronchitis 03/13/2013  . External hemorrhoids 09/28/2012  . Acrophobia   . Prostate cancer screening 04/24/2011  . Hypogonadism, male 04/08/2011    Past Surgical History:  Procedure Laterality Date  . adnoids    .  CARDIOVASCULAR STRESS TEST     Normal stress nuclear study, EF normal. +Hypertensive bp response--subsequent 24H ambulatory BP monitoring confirmed HTN.  . COLONOSCOPY  05/2006, 05/2012  . HEMORRHOID BANDING    . POLYPECTOMY     Adenomatous 2007; hyperplastic 2013.  Repeat 5 yrs.  . TRANSTHORACIC ECHOCARDIOGRAM  01/30/16   Normal except mild aortic dilatation  . WISDOM TOOTH EXTRACTION      Current Outpatient Rx  . Order #: [id number] Class: Normal  . Order #: [id number] Class: Normal  . Order #: [id number] Class: Normal  . Order #: [id number] Class: Normal  . Order #: [id number] Class: Normal  . Order #: [id number] Class: Normal  . Order #: [id number] Class: Normal  . Order #: [id number] Class: Normal  . Order #: [id number] Class: Normal  . Order #: [id number] Class: Normal  . Order #: [id number] Class: Normal    Allergies Molds & smuts  Family History  Problem Relation Age of Onset  . Hypertension Mother   . Diabetes Mother   . CVA Mother   . Hypertension Father   . Diabetes Father   . Dementia Father   . Heart disease Father   . CAD Father     Social History Social History   Tobacco Use  . Smoking status: Never Smoker  . Smokeless tobacco: Never Used  Substance Use Topics  . Alcohol use: Yes    Alcohol/week: 0.0 standard drinks    Comment: rarely  . Drug use: No    Review of Systems  All other systems negative except as documented in the HPI. All pertinent positives and negatives as reviewed in the HPI. ____________________________________________   PHYSICAL EXAM:  VITAL SIGNS: ED Triage Vitals  Enc Vitals Group     BP 06/08/19 0059 (!) 145/94     Pulse Rate 06/08/19 0059 82     Resp 06/08/19 0059 17     Temp 06/08/19 0059 98.2 F (36.8 C)     Temp Source 06/08/19 0059 Oral     SpO2 06/08/19 0059 99 %     Weight 06/08/19 0057 208 lb (94.3 kg)     Height 06/08/19 0057 5\' 10"  (1.778 m)    Constitutional: Alert and oriented. Well appearing and in no acute distress.  Eyes: Conjunctivae are normal. PERRL. EOMI. Head: Atraumatic. Nose: No congestion/rhinnorhea. Mouth/Throat: Mucous membranes are moist.  Oropharynx non-erythematous. Neck: No stridor.  No meningeal signs.   Cardiovascular: Normal rate, regular rhythm. Good peripheral circulation. Grossly normal heart sounds.   Respiratory: Normal respiratory effort.  No retractions. Lungs CTAB. Gastrointestinal: Soft and nontender. No distention.  Musculoskeletal: No lower extremity tenderness nor edema. No gross deformities of extremities. ttp and swelling over left olecranon bursa Neurologic:  Normal speech and language. No gross focal neurologic deficits are appreciated.  Skin:  Skin is warm, dry and intact. No rash noted.   ____________________________________________   RADIOLOGY  [place] Limited Joint Space Structures Up Left  Result Date: 06/07/2019 Limited ultrasound: Left elbow:  Normal appearance at the insertion of the triceps tendon. Mild olecranon bursitis with small effusion.  Appears to have hyperechoic density to suggest gout. There is  cobblestoning in the soft tissue suggest soft tissue swelling. There does not appear to be a joint effusion  Summary: Findings suggestive of gout olecranon bursitis  Ultrasound and interpretation by Clearance Coots, MD   ____________________________________________   INITIAL IMPRESSION / ASSESSMENT AND PLAN / ED COURSE  Afebrile normal vital signs with ultrasound done earlier today that showed negative joint effusion.  Suspect infected bursitis will probably need a surgical removal but no indication for admission or urgent evaluation this time.  We will follow-up with orthopedics for reevaluation.  Will switch to Bactrim and give prescription for pain medication awaiting orthopedic consult.     Pertinent labs & imaging results that were available during my care of the patient were reviewed by me and considered in my medical decision making (see chart for  details).  A medical screening exam was performed and I feel the patient has had an appropriate workup for their chief complaint at this time and likelihood of emergent condition existing is low. They have been counseled on decision, discharge, follow up and which symptoms necessitate immediate return to the emergency department. They or their family verbally stated understanding and agreement with plan and discharged in stable condition.   ____________________________________________  FINAL CLINICAL IMPRESSION(S) / ED DIAGNOSES  Final diagnoses:  Olecranon bursitis of left elbow     MEDICATIONS GIVEN DURING THIS VISIT:  Medications  sulfamethoxazole-trimethoprim (BACTRIM DS) 800-160 MG per tablet 1 tablet (has no administration in time range)  oxyCODONE-acetaminophen (PERCOCET/ROXICET) 5-325 MG per tablet 1 tablet (has no administration in time range)     NEW OUTPATIENT MEDICATIONS STARTED DURING THIS VISIT:  New Prescriptions   OXYCODONE-ACETAMINOPHEN (PERCOCET) 5-325 MG TABLET    Take 2 tablets by mouth every 4 (four) hours as needed.   SULFAMETHOXAZOLE-TRIMETHOPRIM (BACTRIM DS) 800-160 MG TABLET    Take 1 tablet by mouth 2 (two) times daily for 7 days.    Note:  This note was prepared with assistance of Dragon voice recognition software. Occasional wrong-word or sound-a-like substitutions may have occurred due to the inherent limitations of voice recognition software.   Jeanclaude Wentworth, Corene Cornea, MD 06/08/19 [phone]

## 2019-06-08 NOTE — ED Triage Notes (Signed)
Pt c/o L elbow pain- pt has been seen for cellulitis and completed two courses of abx and steroids with no relief.

## 2019-06-09 ENCOUNTER — Other Ambulatory Visit: Payer: 59

## 2019-06-14 ENCOUNTER — Other Ambulatory Visit (INDEPENDENT_AMBULATORY_CARE_PROVIDER_SITE_OTHER): Payer: 59

## 2019-06-14 ENCOUNTER — Other Ambulatory Visit: Payer: Self-pay

## 2019-06-14 DIAGNOSIS — E230 Hypopituitarism: Secondary | ICD-10-CM

## 2019-06-14 DIAGNOSIS — E782 Mixed hyperlipidemia: Secondary | ICD-10-CM | POA: Diagnosis not present

## 2019-06-14 DIAGNOSIS — E119 Type 2 diabetes mellitus without complications: Secondary | ICD-10-CM

## 2019-06-14 LAB — LIPID PANEL
Cholesterol: 156 mg/dL (ref 0–200)
HDL: 33.5 mg/dL — ABNORMAL LOW (ref >39.00)
LDL Cholesterol: 87 mg/dL (ref 0–99)
NonHDL: 122.37
Total CHOL/HDL Ratio: 5
Triglycerides: 179 mg/dL — ABNORMAL HIGH (ref 0.0–149.0)
VLDL: 35.8 mg/dL (ref 0.0–40.0)

## 2019-06-14 LAB — BASIC METABOLIC PANEL
BUN: 26 mg/dL — ABNORMAL HIGH (ref 6–23)
CO2: 25 mEq/L (ref 19–32)
Calcium: 9.9 mg/dL (ref 8.4–10.5)
Chloride: 103 mEq/L (ref 96–112)
Creatinine, Ser: 1.34 mg/dL (ref 0.40–1.50)
GFR: 54.34 mL/min — ABNORMAL LOW (ref >60.00)
Glucose, Bld: 107 mg/dL — ABNORMAL HIGH (ref 70–99)
Potassium: 4.5 mEq/L (ref 3.5–5.1)
Sodium: 138 mEq/L (ref 135–145)

## 2019-06-14 LAB — HEMOGLOBIN A1C: Hgb A1c MFr Bld: 7 % — ABNORMAL HIGH (ref 4.6–6.5)

## 2019-06-14 LAB — CBC
HCT: 43.3 % (ref 39.0–52.0)
Hemoglobin: 14.2 g/dL (ref 13.0–17.0)
MCHC: 32.8 g/dL (ref 30.0–36.0)
MCV: 83.9 fl (ref 78.0–100.0)
Platelets: 342 10*3/uL (ref 150.0–400.0)
RBC: 5.15 Mil/uL (ref 4.22–5.81)
RDW: 14.1 % (ref 11.5–15.5)
WBC: 7.7 10*3/uL (ref 4.0–10.5)

## 2019-06-14 LAB — TESTOSTERONE: Testosterone: 165.77 ng/dL — ABNORMAL LOW (ref 300.00–890.00)

## 2019-06-16 ENCOUNTER — Other Ambulatory Visit: Payer: Self-pay

## 2019-06-16 ENCOUNTER — Ambulatory Visit: Payer: 59 | Admitting: Endocrinology

## 2019-06-16 ENCOUNTER — Encounter: Payer: Self-pay | Admitting: Endocrinology

## 2019-06-16 VITALS — BP 130/74 | HR 89 | Ht 70.0 in | Wt 200.8 lb

## 2019-06-16 DIAGNOSIS — E1165 Type 2 diabetes mellitus with hyperglycemia: Secondary | ICD-10-CM | POA: Diagnosis not present

## 2019-06-16 DIAGNOSIS — E230 Hypopituitarism: Secondary | ICD-10-CM

## 2019-06-16 DIAGNOSIS — I10 Essential (primary) hypertension: Secondary | ICD-10-CM | POA: Diagnosis not present

## 2019-06-16 DIAGNOSIS — E782 Mixed hyperlipidemia: Secondary | ICD-10-CM

## 2019-06-16 DIAGNOSIS — N289 Disorder of kidney and ureter, unspecified: Secondary | ICD-10-CM

## 2019-06-16 LAB — MICROALBUMIN / CREATININE URINE RATIO
Creatinine,U: 51.7 mg/dL
Microalb Creat Ratio: 1.4 mg/g (ref 0.0–30.0)
Microalb, Ur: <0.7 mg/dL (ref 0.0–1.9)

## 2019-06-16 MED ORDER — TESTOSTERONE 4 MG/24HR TD PT24: 2.0000 | MEDICATED_PATCH | Freq: Every day | TRANSDERMAL | 3 refills | Status: DC

## 2019-06-16 NOTE — Patient Instructions (Signed)
Check blood sugars on waking up 2-3 days a week  Also check blood sugars about 2 hours after meals and do this after different meals by rotation  Recommended blood sugar levels on waking up are 90-130 and about 2 hours after meal is 130-160  Please bring your blood sugar monitor to each visit, thank you   

## 2019-06-16 NOTE — Progress Notes (Signed)
Patient ID: Douglas [name], male   DOB: October 04, 1960, 58 y.o.   MRN: [id number]           Chief complaint: Endocrinology follow-up  Referring physician: McGowan   History of Present Illness  PROBLEM 1:  Hypogonadism: This was diagnosed in 2009  He was apparently initially seen by his psychiatrist and was having difficulties with fatigue, agoraphobia and decreased libido as well as sexual dysfunction. He was told that his testosterone level was significantly low and was given testosterone supplements probably with AndroGel Subsequently the patient has been treated by various physicians including the urologist with testosterone supplements, mostly AndroGel He thinks that his testosterone levels have never been normal with AndroGel although he thinks that he felt a little better with taking this from his urologist who called him to apply it on his abdomen  He was also tried on monthly injections for about 3 months without consistent benefit also In 2016 he was given [place] again without any benefit   RECENT history: He had baseline complaints offatigue, decreased motivation, decreased libido and erectile dysfunction from hypogonadism  Initial evaluation showed low free testosterone of 4.5 and normal LH   He had been on clomiphene 25 mg daily after his initial consultation in 09/2015  Because of continued fatigue, decreased libido and testosterone levels being on the low end of normal he was switched to ANDROGEL 3 pumps daily in 11/2017 He subjectively does feel better with testosterone gel compared to clomiphene a  The AndroGel has been increased to 2 pumps on each arm In July because of traveling he did not use it for about 3 weeks Subsequently although he thinks he is using it more regularly he probably does miss a dose periodically He thinks he has difficulty doing this in the mornings because of being rushed  Still complaining of of some fatigue, lack of motivation and  decreased libido  Labs show significantly lower testosterone level and now below 200   His lab results showtestosterone levels as follows:  Lab Results  Component Value Date   TESTOSTERONE 165.77 (L) 06/14/2019   TESTOSTERONE 279.80 (L) 11/07/2018   TESTOSTERONE 271.07 (L) 07/06/2018   TESTOSTERONE 151.55 (L) 03/08/2018     Lab Results  Component Value Date   LH 4.30 11/07/2018    DIABETES type II: He had significant hyperglycemia at onset with A1c about 13  Since he was not benefiting from Actos and metformin given by his PCP he was given Invokana in addition in August 2018 Since 07/2017 he has been on Ozempic  Non-insulin HYPOGLYCEMIC regimen: Farxiga 10 mg daily, metformin 1000 mg twice daily and Ozempic 0.5 mg weekly  Baseline A1c was 7.8 and is now 7% compared to 6.8 previously  Current management, problems identified and blood sugar patterns:  He generally checks his blood sugars in the mornings mostly  As before he is irregular with monitoring and lately has not checked his blood sugars as he did not refill his test strips  Lab fasting glucose was 107  He now thinks that he is doing a lot of walking at work and appears to have lost some weight also recently  Has been fairly consistent with taking his Ozempic weekly  His last consultation with dietitian was in 2016  Blood sugars at [name]: Fasting range 94-134 After meals 130-160    Wt Readings from Last 3 Encounters:  06/16/19 200 lb 12.8 oz (91.1 kg)  06/08/19 208 lb (94.3 kg)  06/07/19 212 lb (96.2  kg)     Lab Results  Component Value Date   HGBA1C 7.0 (H) 06/14/2019   HGBA1C 6.8 (H) 11/07/2018   HGBA1C 6.8 (H) 07/06/2018   Lab Results  Component Value Date   MICROALBUR <0.7 06/16/2019   LDLCALC 87 06/14/2019   CREATININE 1.34 06/14/2019   No other active problems: See review of systems   Allergies as of 06/16/2019      Reactions   Molds & Smuts Other (See Comments)      Medication  List       Accurate as of June 16, 2019 11:59 PM. If you have any questions, ask your nurse or doctor.        STOP taking these medications   Testosterone 20.25 MG/ACT (1.62%) Gel Replaced by: testosterone 4 MG/24HR Pt24 patch Stopped by: Elayne Snare, MD     TAKE these medications   ALPRAZolam 1 MG tablet Commonly known as: XANAX 1 tab po q8h prn anxiety   colchicine 0.6 MG tablet Take 1 tablet (0.6 mg total) by mouth 2 (two) times daily.   Farxiga 10 MG Tabs tablet Generic drug: dapagliflozin propanediol TAKE 1 TABLET BY MOUTH EVERY DAY   fenofibrate 145 MG tablet Commonly known as: TRICOR TAKE 1 TABLET BY MOUTH EVERY DAY   glucose blood test strip Check glucose bid   metFORMIN 1000 MG tablet Commonly known as: GLUCOPHAGE TAKE 1 TABLET (1,000 MG TOTAL) BY MOUTH 2 (TWO) TIMES DAILY WITH A MEAL.   oxyCODONE-acetaminophen 5-325 MG tablet Commonly known as: Percocet Take 2 tablets by mouth every 4 (four) hours as needed.   Ozempic (0.25 or 0.5 MG/DOSE) 2 MG/1.5ML Sopn Generic drug: Semaglutide(0.25 or 0.5MG /DOS) Inject 0.5 mg into the skin once a week.   testosterone 4 MG/24HR Pt24 patch Commonly known as: ANDRODERM Place 2 patches onto the skin daily. Replaces: Testosterone 20.25 MG/ACT (1.62%) Gel Started by: Elayne Snare, MD   valsartan 160 MG tablet Commonly known as: DIOVAN Take 1 tablet (160 mg total) by mouth daily.       Allergies:  Allergies  Allergen Reactions  . Molds & Smuts Other (See Comments)    Past Medical History:  Diagnosis Date  . 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  . Adenomatous colon polyp 05/07/06; 05/2012   Repeat colonoscopy 05/17/2012 showed 2 small polyps that were removed--path showed Hyperplastic-not adenomatous.  Still needs repeat 5 yrs.  . Angioedema of lips    saw allergist 11/2011  . Asthmatic bronchitis , chronic ([place]) 01/11/2012   Question  occupational asthma with no specific agents identified. He does notice that some areas of his job are particularly dusty and dirty. Currently he is well controlled using Symbicort through an AeroChamber and rarely needing his rescue inhaler. This would be an acceptable long-term status. Elevated nonspecific total IgE and peripheral eosinophilia to suggest an atopic trigger with sensitization to something Consider Daliresp for future use if needed   . Chronic fatigue    +daytime somnolence  . Chronic recurrent sinusitis 02/28/2012  . Depression    Prozac in the past not much help.  Spontaneously resolved.  . Diabetes mellitus without complication ([place])    managed by Dr. Dwyane Dee ([facility])--this is accurate as of 02/2018, when pt last saw Dr. Dwyane Dee.  . Diverticulosis   . Generalized anxiety disorder 08/11/2012  . Hemorrhoids    ext and int  . Hyperlipemia, mixed  elev trigs with low HDL. Fish oil OTC.  Statin started in Fall 2018.  Fenofibrate started 07/2018  . Hypertension 2017  . Hypogonadotropic hypogonadism ([place]) 04/08/2011   As of 2017/18/19, Dr. Dwyane Dee ([facility]) is following: he started clomiph but no better at f/u 11/2016.  Pt denied any improvemt,though testost improved.  11/2017 Clomiphene d/c'd-topical testo started. His hypogonadism is due to insulin resistance syndrome.  As of 08/2017 endo f/u: pt staying on clomiphene (no testost) and is feeling some improved.  No imp 11/2017 f/u on topical testost (off clom)  . Insomnia   . Internal hemorrhoids, bleeding and sicharge 05/29/2013  . OSA on CPAP    setting changed to 6 cm H20 (fixed) 10/2016 by pulm.  . Rotator cuff tear 2011   [facility]: conservative mgmt--symptoms stable as of 04/2011.    Past Surgical History:  Procedure Laterality Date  . adnoids    . CARDIOVASCULAR STRESS TEST     Normal stress nuclear study, EF normal. +Hypertensive bp response--subsequent 24H ambulatory BP monitoring confirmed HTN.  . COLONOSCOPY  05/2006, 05/2012   . HEMORRHOID BANDING    . POLYPECTOMY     Adenomatous 2007; hyperplastic 2013.  Repeat 5 yrs.  . TRANSTHORACIC ECHOCARDIOGRAM  01/30/16   Normal except mild aortic dilatation  . WISDOM TOOTH EXTRACTION      Family History  Problem Relation Age of Onset  . Hypertension Mother   . Diabetes Mother   . CVA Mother   . Hypertension Father   . Diabetes Father   . Dementia Father   . Heart disease Father   . CAD Father     Social History:  reports that he has never smoked. He has never used smokeless tobacco. He reports current alcohol use. He reports that he does not use drugs.  Review of Systems  HYPERLIPIDEMIA: Has mixed hyperlipidemia  He has been on Lipitor and fenofibrate, triglycerides are persistently high although relatively better and now below 200  He has been more active overall and weight is coming down Usually tries to watch his diet also  LDL under 100  Lab Results  Component Value Date   CHOL 156 06/14/2019   CHOL 147 11/07/2018   CHOL 105 07/06/2018   Lab Results  Component Value Date   HDL 33.50 (L) 06/14/2019   HDL 26.30 (L) 11/07/2018   HDL 29.10 (L) 07/06/2018   Lab Results  Component Value Date   LDLCALC 87 06/14/2019   Lab Results  Component Value Date   TRIG 179.0 (H) 06/14/2019   TRIG 290.0 (H) 11/07/2018   TRIG 330.0 (H) 07/06/2018   Lab Results  Component Value Date   CHOLHDL 5 06/14/2019   CHOLHDL 6 11/07/2018   CHOLHDL 4 07/06/2018   Lab Results  Component Value Date   LDLDIRECT 79.0 11/07/2018   LDLDIRECT 42.0 07/06/2018   LDLDIRECT 33.0 03/08/2018    HYPERTENSION:    This is being treated by PCP   He is now on valsartan Not monitoring at [name]  Blood pressure is fairly good today but otherwise variable  BP Readings from Last 3 Encounters:  06/16/19 130/74  06/08/19 (!) 145/94  06/07/19 106/71   Creatinine appears relatively higher, has been taking more aspirin  Lab Results  Component Value Date   CREATININE  1.34 06/14/2019   CREATININE 1.05 11/07/2018   CREATININE 0.83 07/06/2018      General Examination:   BP 130/74 (BP Location: Left Arm, Patient Position: Sitting, Cuff Size:  Normal)   Pulse 89   Ht 5\' 10"  (1.778 m)   Wt 200 lb 12.8 oz (91.1 kg)   SpO2 98%   BMI 28.81 kg/m    Assessment/ Plan:    Hypogonadotropic hypogonadism, long-standing  This is secondary to insulin resistance syndrome  He has difficulty getting consistent results with his treatment Has not had consistently normal testosterone levels although initially had improved results with clomiphene Now even with 2 pumps on each arm of the AndroGel his testosterone level is below 200 This is likely to be from noncompliance with using the AndroGel daily because of his rushed routine in the morning before going to work  He does have some fatigue and decreased libido  He now agrees to a trial of Androderm patches which appear to be covered by his insurance As he is usually requiring larger doses of testosterone supplements he will try a total of 8 mg daily for now Discussed possibility of local skin reactions and he will notify us if he has problems Currently [name] is not on formulary  HYPERLIPIDEMIA: Has persistently high triglycerides, on Lipitor and fenofibrate He is now doing better with some weight loss, increased exercise and generally better diet LDL below 110 he will continue the same regimen  DIABETES type 2:  A1c is 7% and fairly consistent with his multiple drug regimen including Ozempic and Farxiga Some of his higher readings may be related to his traveling over the last couple of months and recently may not be planning his meals as well However he is walking quite a lot reportedly at work  New prescription for test strips will be given and he needs to check readings both fasting and after meals, discussed targets for both types of readings  HYPERTENSION: Now better controlled and he will continue  valsartan 160 mg, Does appear to have mild renal dysfunction without high potassium level This is likely to be from his taking more aspirin recently for pain Advised him to minimize this and follow-up with his PCP regarding treatment of his bursitis  Currently microalbumin is normal  Discussed importance of influenza vaccine, he wants to wait on this  Total visit time for evaluation and management of multiple problems and counseling =25 minutes  Patient Instructions  Check blood sugars on waking up 2-3  days a week  Also check blood sugars about 2 hours after meals and do this after different meals by rotation  Recommended blood sugar levels on waking up are 90-130 and about 2 hours after meal is 130-160  Please bring your blood sugar monitor to each visit, thank you      Elayne Snare 06/18/2019, 5:39 PM

## 2019-06-29 ENCOUNTER — Other Ambulatory Visit: Payer: Self-pay | Admitting: Family Medicine

## 2019-08-06 ENCOUNTER — Other Ambulatory Visit: Payer: Self-pay | Admitting: Family Medicine

## 2019-08-29 ENCOUNTER — Other Ambulatory Visit: Payer: Self-pay | Admitting: Family Medicine

## 2019-09-02 ENCOUNTER — Other Ambulatory Visit: Payer: Self-pay | Admitting: Family Medicine

## 2019-09-13 ENCOUNTER — Other Ambulatory Visit (INDEPENDENT_AMBULATORY_CARE_PROVIDER_SITE_OTHER): Payer: 59

## 2019-09-13 ENCOUNTER — Other Ambulatory Visit: Payer: Self-pay

## 2019-09-13 DIAGNOSIS — E230 Hypopituitarism: Secondary | ICD-10-CM

## 2019-09-13 DIAGNOSIS — E1165 Type 2 diabetes mellitus with hyperglycemia: Secondary | ICD-10-CM

## 2019-09-13 LAB — CBC
HCT: 43.2 % (ref 39.0–52.0)
Hemoglobin: 14.5 g/dL (ref 13.0–17.0)
MCHC: 33.5 g/dL (ref 30.0–36.0)
MCV: 83.6 fl (ref 78.0–100.0)
Platelets: 295 10*3/uL (ref 150.0–400.0)
RBC: 5.17 Mil/uL (ref 4.22–5.81)
RDW: 14.8 % (ref 11.5–15.5)
WBC: 7.3 10*3/uL (ref 4.0–10.5)

## 2019-09-13 LAB — BASIC METABOLIC PANEL
BUN: 16 mg/dL (ref 6–23)
CO2: 27 mEq/L (ref 19–32)
Calcium: 9.7 mg/dL (ref 8.4–10.5)
Chloride: 103 mEq/L (ref 96–112)
Creatinine, Ser: 1 mg/dL (ref 0.40–1.50)
GFR: 76.11 mL/min (ref >60.00)
Glucose, Bld: 216 mg/dL — ABNORMAL HIGH (ref 70–99)
Potassium: 4.3 mEq/L (ref 3.5–5.1)
Sodium: 138 mEq/L (ref 135–145)

## 2019-09-13 LAB — TESTOSTERONE: Testosterone: 518.96 ng/dL (ref 300.00–890.00)

## 2019-09-14 LAB — HEMOGLOBIN A1C: Hgb A1c MFr Bld: 6.6 % — ABNORMAL HIGH (ref 4.6–6.5)

## 2019-09-21 ENCOUNTER — Other Ambulatory Visit: Payer: Self-pay | Admitting: Family Medicine

## 2019-09-22 ENCOUNTER — Ambulatory Visit: Payer: 59 | Admitting: Endocrinology

## 2019-10-03 ENCOUNTER — Other Ambulatory Visit: Payer: Self-pay

## 2019-10-04 ENCOUNTER — Encounter: Payer: Self-pay | Admitting: Endocrinology

## 2019-10-04 ENCOUNTER — Other Ambulatory Visit: Payer: Self-pay

## 2019-10-04 ENCOUNTER — Ambulatory Visit (INDEPENDENT_AMBULATORY_CARE_PROVIDER_SITE_OTHER): Payer: 59 | Admitting: Endocrinology

## 2019-10-04 DIAGNOSIS — E782 Mixed hyperlipidemia: Secondary | ICD-10-CM

## 2019-10-04 DIAGNOSIS — Z125 Encounter for screening for malignant neoplasm of prostate: Secondary | ICD-10-CM | POA: Diagnosis not present

## 2019-10-04 DIAGNOSIS — E230 Hypopituitarism: Secondary | ICD-10-CM

## 2019-10-04 DIAGNOSIS — E119 Type 2 diabetes mellitus without complications: Secondary | ICD-10-CM | POA: Diagnosis not present

## 2019-10-04 MED ORDER — FARXIGA 10 MG PO TABS: 10.0000 mg | ORAL_TABLET | Freq: Every day | ORAL | 3 refills | Status: DC

## 2019-10-04 MED ORDER — GLUCOSE BLOOD VI STRP: ORAL_STRIP | 12 refills | Status: DC

## 2019-10-04 NOTE — Progress Notes (Signed)
Patient ID: Douglas [name], male   DOB: 1961-06-13, 59 y.o.   MRN: [id number]           Chief complaint: Endocrinology follow-up  Referring physician: McGowan  I connected with the above-named patient by video enabled telemedicine application and verified that I am speaking with the correct person. The patient was explained the limitations of evaluation and management by telemedicine and the availability of in person appointments.  Patient also understood that there may be a patient responsible charge related to this service . Location of the patient: Patient's [name] . Location of the provider: Physician office Only the patient and myself were participating in the encounter The patient understood the above statements and agreed to proceed.  History of Present Illness  PROBLEM 1:  Hypogonadism: This was diagnosed in 2009  He was apparently initially seen by his psychiatrist and was having difficulties with fatigue, agoraphobia and decreased libido as well as sexual dysfunction. He was told that his testosterone level was significantly low and was given testosterone supplements probably with AndroGel Subsequently the patient has been treated by various physicians including the urologist with testosterone supplements, mostly AndroGel He thinks that his testosterone levels have never been normal with AndroGel although he thinks that he felt a little better with taking this from his urologist who called him to apply it on his abdomen  He was also tried on monthly injections for about 3 months without consistent benefit also In 2016 he was given [place] again without any benefit   RECENT history: He had baseline complaints offatigue, decreased motivation, decreased libido and erectile dysfunction from hypogonadism  Initial evaluation showed low free testosterone of 4.5 and normal LH  He had been on clomiphene 25 mg daily after his initial consultation in 09/2015  Because of  continued fatigue, decreased libido and testosterone levels being on the low end of normal he was switched to ANDROGEL 3 pumps daily in 11/2017  The AndroGel has been increased to 2 pumps on each arm He was having difficulty applying this in the mornings because of not having time before going to work With this his testosterone levels could be consistently low  Since his last testosterone level is only 165 he was switched to Androderm, 8 mg daily in October 2020  With this he is having better energy level and not as weak, is able to also start a little exercise which he was not doing previously He has only some redness at the site of application but he does better when he moves it around  Labs show significantly improved testosterone level and now 519 Hemoglobin stable   His lab results showtestosterone levels as follows:  Lab Results  Component Value Date   TESTOSTERONE 518.96 09/13/2019   TESTOSTERONE 165.77 (L) 06/14/2019   TESTOSTERONE 279.80 (L) 11/07/2018   TESTOSTERONE 271.07 (L) 07/06/2018     Lab Results  Component Value Date   LH 4.30 11/07/2018    DIABETES type II: He had significant hyperglycemia at onset with A1c about 13  Since he was not benefiting from Actos and metformin given by his PCP he was given Invokana in addition in August 2018 Since 07/2017 he has been on Ozempic  Non-insulin HYPOGLYCEMIC regimen: Farxiga 10 mg daily, metformin 1000 mg twice daily and Ozempic 0.5 mg weekly  Baseline A1c was 7.8 and is now 6.6 compared to 7  Current management, problems identified and blood sugar patterns:  He does not check his blood sugars and probably has  not done any readings for several months, again did not ask for refill on his test strips  Lab glucose was 216 but he does not know what he had for lunch that day  Also appears that he did not refill his Wilder Glade probably last month  Has been fairly consistent with taking his Ozempic weekly  He has done a  little exercise at time  Usually trying to plan his meals somewhat better  His last consultation with dietitian was in 2016  Blood sugars at [name]: Fasting range 94-134 After meals 130-160    Wt Readings from Last 3 Encounters:  06/16/19 200 lb 12.8 oz (91.1 kg)  06/08/19 208 lb (94.3 kg)  06/07/19 212 lb (96.2 kg)     Lab Results  Component Value Date   HGBA1C 6.6 (H) 09/13/2019   HGBA1C 7.0 (H) 06/14/2019   HGBA1C 6.8 (H) 11/07/2018   Lab Results  Component Value Date   MICROALBUR <0.7 06/16/2019   [place] 87 06/14/2019   CREATININE 1.00 09/13/2019   No other active problems: See review of systems   Allergies as of 10/04/2019      Reactions   Molds & Smuts Other (See Comments)      Medication List       Accurate as of October 04, 2019  8:46 AM. If you have any questions, ask your nurse or doctor.        fenofibrate 145 MG tablet Commonly known as: TRICOR TAKE 1 TABLET BY MOUTH EVERY DAY   glucose blood test strip Check glucose bid   metFORMIN 1000 MG tablet Commonly known as: GLUCOPHAGE TAKE 1 TABLET (1,000 MG TOTAL) BY MOUTH 2 (TWO) TIMES DAILY WITH A MEAL.**NEED APPOINTMENT**   oxyCODONE-acetaminophen 5-325 MG tablet Commonly known as: Percocet Take 2 tablets by mouth every 4 (four) hours as needed.   Ozempic (0.25 or 0.5 MG/DOSE) 2 MG/1.5ML Sopn Generic drug: Semaglutide(0.25 or 0.5MG /DOS) Inject 0.5 mg into the skin once a week.   testosterone 4 MG/24HR Pt24 patch Commonly known as: ANDRODERM Place 2 patches onto the skin daily.   valsartan 160 MG tablet Commonly known as: DIOVAN Take 1 tablet (160 mg total) by mouth daily.       Allergies:  Allergies  Allergen Reactions  . Molds & Smuts Other (See Comments)    Past Medical History:  Diagnosis Date  . 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  . Adenomatous colon polyp 05/07/06; 05/2012   Repeat colonoscopy  05/17/2012 showed 2 small polyps that were removed--path showed Hyperplastic-not adenomatous.  Still needs repeat 5 yrs.  . Angioedema of lips    saw allergist 11/2011  . Asthmatic bronchitis , chronic ([place]) 01/11/2012   Question occupational asthma with no specific agents identified. He does notice that some areas of his job are particularly dusty and dirty. Currently he is well controlled using Symbicort through an AeroChamber and rarely needing his rescue inhaler. This would be an acceptable long-term status. Elevated nonspecific total IgE and peripheral eosinophilia to suggest an atopic trigger with sensitization to something Consider Daliresp for future use if needed   . Chronic fatigue    +daytime somnolence  . Chronic recurrent sinusitis 02/28/2012  . Depression    Prozac in the past not much help.  Spontaneously resolved.  . Diabetes mellitus without complication ([place])    managed by Dr. Dwyane Dee (endo)--this is accurate as of 02/2018, when  pt last saw Dr. Dwyane Dee.  . Diverticulosis   . Generalized anxiety disorder 08/11/2012  . Hemorrhoids    ext and int  . Hyperlipemia, mixed    elev trigs with low HDL. Fish oil OTC.  Statin started in Fall 2018.  Fenofibrate started 07/2018  . Hypertension 2017  . Hypogonadotropic hypogonadism ([place]) 04/08/2011   As of 2017/18/19, Dr. Dwyane Dee (endo) is following: he started clomiph but no better at f/u 11/2016.  Pt denied any improvemt,though testost improved.  11/2017 Clomiphene d/c'd-topical testo started. His hypogonadism is due to insulin resistance syndrome.  As of 08/2017 endo f/u: pt staying on clomiphene (no testost) and is feeling some improved.  No imp 11/2017 f/u on topical testost (off clom)  . Insomnia   . Internal hemorrhoids, bleeding and sicharge 05/29/2013  . OSA on CPAP    setting changed to 6 cm H20 (fixed) 10/2016 by pulm.  . Rotator cuff tear 2011   [facility]: conservative mgmt--symptoms stable as of 04/2011.    Past Surgical History:   Procedure Laterality Date  . adnoids    . CARDIOVASCULAR STRESS TEST     Normal stress nuclear study, EF normal. +Hypertensive bp response--subsequent 24H ambulatory BP monitoring confirmed HTN.  . COLONOSCOPY  05/2006, 05/2012  . HEMORRHOID BANDING    . POLYPECTOMY     Adenomatous 2007; hyperplastic 2013.  Repeat 5 yrs.  . TRANSTHORACIC ECHOCARDIOGRAM  01/30/16   Normal except mild aortic dilatation  . WISDOM TOOTH EXTRACTION      Family History  Problem Relation Age of Onset  . Hypertension Mother   . Diabetes Mother   . CVA Mother   . Hypertension Father   . Diabetes Father   . Dementia Father   . Heart disease Father   . CAD Father     Social History:  reports that he has never smoked. He has never used smokeless tobacco. He reports current alcohol use. He reports that he does not use drugs.  Review of Systems  HYPERLIPIDEMIA: Has mixed hyperlipidemia  He has been on Lipitor and fenofibrate, triglycerides are persistently high although relatively better in 10/20 being below 200  Blood sugar control is improving Usually tries to watch his diet also  LDL under 100  Lab Results  Component Value Date   CHOL 156 06/14/2019   CHOL 147 11/07/2018   CHOL 105 07/06/2018   Lab Results  Component Value Date   HDL 33.50 (L) 06/14/2019   HDL 26.30 (L) 11/07/2018   HDL 29.10 (L) 07/06/2018   Lab Results  Component Value Date   LDLCALC 87 06/14/2019   Lab Results  Component Value Date   TRIG 179.0 (H) 06/14/2019   TRIG 290.0 (H) 11/07/2018   TRIG 330.0 (H) 07/06/2018   Lab Results  Component Value Date   CHOLHDL 5 06/14/2019   CHOLHDL 6 11/07/2018   CHOLHDL 4 07/06/2018   Lab Results  Component Value Date   LDLDIRECT 79.0 11/07/2018   LDLDIRECT 42.0 07/06/2018   LDLDIRECT 33.0 03/08/2018    HYPERTENSION:    This is being treated by PCP   He is on valsartan 160 mg daily   Blood pressure is fairly good at [name] reportedly with readings about 130/80  BP  Readings from Last 3 Encounters:  06/16/19 130/74  06/08/19 (!) 145/94  06/07/19 106/71   Creatinine has improved  Lab Results  Component Value Date   CREATININE 1.00 09/13/2019   CREATININE 1.34 06/14/2019   CREATININE  1.05 11/07/2018      General Examination:   There were no vitals taken for this visit.   Assessment/ Plan:    Hypogonadotropic hypogonadism, long-standing  This is secondary to insulin resistance syndrome  He has done much better with using Androderm instead of AndroGel, this was switched in October Likely has better compliance with this Testosterone level is just over 500 He is subjectively doing better No change in hemoglobin and no recent symptoms of prostatism also  He has generally tolerated this and will continue Androderm 8 mg daily  We will check the levels again in 3 months, may consider reducing to 6 mg if testosterone tends to rise Also recheck CBC again.  Will need PSA screening, this has been done previously in 2018  HYPERLIPIDEMIA: Has mixed hyperlipidemia and will recheck fasting labs on the next visit  DIABETES type 2:  A1c is improved at 6.6  He is likely doing better with more consistent diet and not traveling as much However appears to be out of his [place] prescription but he did not ask for refill  New prescription for test strips will be sent again Advised him to check blood sugars after meals especially lunch which is his main meal Discussed cutting back on high carbohydrate foods especially if blood sugars are higher postprandially  Encouraged him to be exercising more regularly Wilder Glade will be refilled  HYPERTENSION: On Diovan and reportedly blood pressure is good at [name]  Kidney function is better with reducing nonsteroidal drugs like aspirin Discussed importance of Covid vaccine and he requests information on where he can get it from   There are no Patient Instructions on file for this visit.   Elayne Snare  10/04/2019, 8:46 AM

## 2019-10-07 ENCOUNTER — Other Ambulatory Visit: Payer: Self-pay | Admitting: Endocrinology

## 2019-10-20 ENCOUNTER — Other Ambulatory Visit: Payer: Self-pay | Admitting: Family Medicine

## 2019-11-18 ENCOUNTER — Other Ambulatory Visit: Payer: Self-pay | Admitting: Family Medicine

## 2019-11-22 ENCOUNTER — Other Ambulatory Visit: Payer: Self-pay | Admitting: Family Medicine

## 2019-12-09 ENCOUNTER — Other Ambulatory Visit: Payer: Self-pay | Admitting: Endocrinology

## 2019-12-11 NOTE — Telephone Encounter (Signed)
Sent mychart message to pt informing him of this.

## 2019-12-11 NOTE — Telephone Encounter (Signed)
Prescription refilled, he is supposed to be seen next month for follow-up with labs

## 2019-12-11 NOTE — Telephone Encounter (Signed)
Please refill if appropriate

## 2019-12-13 ENCOUNTER — Other Ambulatory Visit: Payer: Self-pay | Admitting: Family Medicine

## 2019-12-13 NOTE — Telephone Encounter (Signed)
LM for pt to schedule appt for CPE. Last seen 11/11/18

## 2019-12-19 ENCOUNTER — Other Ambulatory Visit: Payer: Self-pay | Admitting: Endocrinology

## 2020-01-11 ENCOUNTER — Other Ambulatory Visit: Payer: Self-pay | Admitting: Endocrinology

## 2020-02-09 ENCOUNTER — Other Ambulatory Visit: Payer: Self-pay | Admitting: Endocrinology

## 2020-02-18 ENCOUNTER — Other Ambulatory Visit: Payer: Self-pay | Admitting: Endocrinology

## 2020-03-14 ENCOUNTER — Other Ambulatory Visit: Payer: Self-pay

## 2020-03-14 ENCOUNTER — Other Ambulatory Visit: Payer: Self-pay | Admitting: Endocrinology

## 2020-03-14 ENCOUNTER — Other Ambulatory Visit (INDEPENDENT_AMBULATORY_CARE_PROVIDER_SITE_OTHER): Payer: 59

## 2020-03-14 DIAGNOSIS — Z125 Encounter for screening for malignant neoplasm of prostate: Secondary | ICD-10-CM

## 2020-03-14 DIAGNOSIS — E119 Type 2 diabetes mellitus without complications: Secondary | ICD-10-CM

## 2020-03-14 DIAGNOSIS — E782 Mixed hyperlipidemia: Secondary | ICD-10-CM

## 2020-03-14 DIAGNOSIS — E230 Hypopituitarism: Secondary | ICD-10-CM | POA: Diagnosis not present

## 2020-03-14 LAB — COMPREHENSIVE METABOLIC PANEL
ALT: 19 U/L (ref 0–53)
AST: 14 U/L (ref 0–37)
Albumin: 4.6 g/dL (ref 3.5–5.2)
Alkaline Phosphatase: 58 U/L (ref 39–117)
BUN: 19 mg/dL (ref 6–23)
CO2: 27 mEq/L (ref 19–32)
Calcium: 9.7 mg/dL (ref 8.4–10.5)
Chloride: 104 mEq/L (ref 96–112)
Creatinine, Ser: 0.96 mg/dL (ref 0.40–1.50)
GFR: 79.65 mL/min (ref >60.00)
Glucose, Bld: 150 mg/dL — ABNORMAL HIGH (ref 70–99)
Potassium: 4 mEq/L (ref 3.5–5.1)
Sodium: 139 mEq/L (ref 135–145)
Total Bilirubin: 0.3 mg/dL (ref 0.2–1.2)
Total Protein: 7.2 g/dL (ref 6.0–8.3)

## 2020-03-14 LAB — LIPID PANEL
Cholesterol: 147 mg/dL (ref 0–200)
HDL: 29.5 mg/dL — ABNORMAL LOW (ref >39.00)
NonHDL: 117.22
Total CHOL/HDL Ratio: 5
Triglycerides: 252 mg/dL — ABNORMAL HIGH (ref 0.0–149.0)
VLDL: 50.4 mg/dL — ABNORMAL HIGH (ref 0.0–40.0)

## 2020-03-14 LAB — CBC
HCT: 45.2 % (ref 39.0–52.0)
Hemoglobin: 15 g/dL (ref 13.0–17.0)
MCHC: 33.3 g/dL (ref 30.0–36.0)
MCV: 83.7 fl (ref 78.0–100.0)
Platelets: 272 10*3/uL (ref 150.0–400.0)
RBC: 5.4 Mil/uL (ref 4.22–5.81)
RDW: 14 % (ref 11.5–15.5)
WBC: 8.8 10*3/uL (ref 4.0–10.5)

## 2020-03-14 LAB — TESTOSTERONE: Testosterone: 137.22 ng/dL — ABNORMAL LOW (ref 300.00–890.00)

## 2020-03-14 LAB — HEMOGLOBIN A1C: Hgb A1c MFr Bld: 6.9 % — ABNORMAL HIGH (ref 4.6–6.5)

## 2020-03-14 LAB — PSA: PSA: 0.97 ng/mL (ref 0.10–4.00)

## 2020-03-14 LAB — LDL CHOLESTEROL, DIRECT: Direct LDL: 90 mg/dL

## 2020-03-19 ENCOUNTER — Encounter: Payer: 59 | Admitting: Endocrinology

## 2020-03-19 ENCOUNTER — Other Ambulatory Visit: Payer: Self-pay

## 2020-03-19 NOTE — Progress Notes (Signed)
This encounter was created in error - please disregard.

## 2020-03-21 ENCOUNTER — Encounter: Payer: Self-pay | Admitting: Endocrinology

## 2020-03-21 ENCOUNTER — Ambulatory Visit (INDEPENDENT_AMBULATORY_CARE_PROVIDER_SITE_OTHER): Payer: 59 | Admitting: Endocrinology

## 2020-03-21 ENCOUNTER — Other Ambulatory Visit: Payer: Self-pay

## 2020-03-21 VITALS — BP 132/76 | HR 80 | Ht 70.0 in | Wt 203.0 lb

## 2020-03-21 DIAGNOSIS — E782 Mixed hyperlipidemia: Secondary | ICD-10-CM

## 2020-03-21 DIAGNOSIS — E119 Type 2 diabetes mellitus without complications: Secondary | ICD-10-CM | POA: Diagnosis not present

## 2020-03-21 DIAGNOSIS — E230 Hypopituitarism: Secondary | ICD-10-CM

## 2020-03-21 DIAGNOSIS — I10 Essential (primary) hypertension: Secondary | ICD-10-CM | POA: Diagnosis not present

## 2020-03-21 MED ORDER — VALSARTAN 80 MG PO TABS: 80.0000 mg | ORAL_TABLET | Freq: Every day | ORAL | 1 refills | Status: DC

## 2020-03-21 MED ORDER — METFORMIN HCL 1000 MG PO TABS: 1000.0000 mg | ORAL_TABLET | Freq: Two times a day (BID) | ORAL | 1 refills | Status: DC

## 2020-03-21 NOTE — Patient Instructions (Signed)
Keep patches on all the time as discussed

## 2020-03-21 NOTE — Progress Notes (Signed)
Patient ID: Douglas [name], male   DOB: October 07, 1960, 59 y.o.   MRN: [id number]           Chief complaint: Endocrinology follow-up    History of Present Illness  PROBLEM 1:  Hypogonadism: This was diagnosed in 2009  He was apparently initially seen by his psychiatrist and was having difficulties with fatigue, agoraphobia and decreased libido as well as sexual dysfunction. He was told that his testosterone level was significantly low and was given testosterone supplements probably with AndroGel Subsequently the patient has been treated by various physicians including the urologist with testosterone supplements, mostly AndroGel He thinks that his testosterone levels have never been normal with AndroGel although he thinks that he felt a little better with taking this from his urologist who called him to apply it on his abdomen  He was also tried on monthly injections for about 3 months without consistent benefit also In 2016 he was given [place] again without any benefit   RECENT history: He had baseline complaints offatigue, decreased motivation, decreased libido and erectile dysfunction from hypogonadism  Initial evaluation showed low free testosterone of 4.5 and normal LH  He had been on clomiphene 25 mg daily after his initial consultation in 09/2015 Because of continued fatigue, decreased libido and testosterone levels being on the low end of normal he was switched to ANDROGEL 3 pumps daily in 11/2017 He was having difficulty applying this in the mornings because of not having time before going to work With this his testosterone levels could be consistently low  He was switched to Androderm, 8 mg daily in October 2020  With this he has had improved energy level, less weakness He is still having some slight itching and rash with the allergy He now said that because of the rash he is not applying the patches consistently and will sometimes use a patch only for 12 hours Not clear  how often he gets 2 patches on consistently  Labs show significantly lower testosterone level and now only 137 compared to 519 However he still does not think he has had any fatigue coming back  Hemoglobin stable   His lab results showtestosterone levels as follows:  Lab Results  Component Value Date   TESTOSTERONE 137.22 (L) 03/14/2020   TESTOSTERONE 518.96 09/13/2019   TESTOSTERONE 165.77 (L) 06/14/2019   TESTOSTERONE 279.80 (L) 11/07/2018     Lab Results  Component Value Date   LH 4.30 11/07/2018    DIABETES type II: He had significant hyperglycemia at onset with A1c about 13  Since he was not benefiting from Actos and metformin given by his PCP he was given Invokana in addition in August 2018 Since 07/2017 he has been on Ozempic  Non-insulin HYPOGLYCEMIC regimen: Farxiga 10 mg daily, metformin 1000 mg twice daily and Ozempic 0.5 mg weekly  Baseline A1c was 7.8 and is now 6.6 compared to 7  Current management, problems identified and blood sugar patterns:  He does not check his blood sugars frequently and now is doing monitoring with a meter provided by his insurance company  However has only 5 readings since the beginning of June and mostly in the morning  He says he is walking up to 4 miles a day on his work routine  His weight has fluctuated somewhat  Overall things he is trying to have relatively healthy meals  He did have a little difficulty getting his Metformin from his pharmacy consistently recently  Nonfasting lab glucose was 150  His last  consultation with dietitian was in 2016  Blood sugars at [name]: Fasting range recently 123, 126, was 142 last month Suppertime 136     Wt Readings from Last 3 Encounters:  03/21/20 (!) 203 lb (92.1 kg)  06/16/19 200 lb 12.8 oz (91.1 kg)  06/08/19 208 lb (94.3 kg)     Lab Results  Component Value Date   HGBA1C 6.9 (H) 03/14/2020   HGBA1C 6.6 (H) 09/13/2019   HGBA1C 7.0 (H) 06/14/2019   Lab Results   Component Value Date   MICROALBUR <0.7 06/16/2019   [place] 87 06/14/2019   CREATININE 0.96 03/14/2020   No other active problems: See review of systems   Allergies as of 03/21/2020      Reactions   Molds & Smuts Other (See Comments)      Medication List       Accurate as of March 21, 2020 10:49 AM. If you have any questions, ask your nurse or doctor.        Androderm 4 MG/24HR Pt24 patch Generic drug: testosterone PLACE 2 PATCHES ONTO THE SKIN DAILY.   Farxiga 10 MG Tabs tablet Generic drug: dapagliflozin propanediol Take 10 mg by mouth daily.   fenofibrate 145 MG tablet Commonly known as: TRICOR TAKE 1 TABLET BY MOUTH EVERY DAY   glucose blood test strip Check glucose bid   metFORMIN 1000 MG tablet Commonly known as: GLUCOPHAGE TAKE 1 TABLET (1,000 MG TOTAL) BY MOUTH 2 (TWO) TIMES DAILY WITH A MEAL.   Ozempic (0.25 or 0.5 MG/DOSE) 2 MG/1.5ML Sopn Generic drug: Semaglutide(0.25 or 0.5MG /DOS) Inject 0.5 mg into the skin once a week.   valsartan 160 MG tablet Commonly known as: DIOVAN TAKE 1 TABLET BY MOUTH EVERY DAY       Allergies:  Allergies  Allergen Reactions  . Molds & Smuts Other (See Comments)    Past Medical History:  Diagnosis Date  . 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  . Adenomatous colon polyp 05/07/06; 05/2012   Repeat colonoscopy 05/17/2012 showed 2 small polyps that were removed--path showed Hyperplastic-not adenomatous.  Still needs repeat 5 yrs.  . Angioedema of lips    saw allergist 11/2011  . Asthmatic bronchitis , chronic ([place]) 01/11/2012   Question occupational asthma with no specific agents identified. He does notice that some areas of his job are particularly dusty and dirty. Currently he is well controlled using Symbicort through an AeroChamber and rarely needing his rescue inhaler. This would be an acceptable long-term status. Elevated nonspecific total IgE  and peripheral eosinophilia to suggest an atopic trigger with sensitization to something Consider Daliresp for future use if needed   . Chronic fatigue    +daytime somnolence  . Chronic recurrent sinusitis 02/28/2012  . Depression    Prozac in the past not much help.  Spontaneously resolved.  . Diabetes mellitus without complication ([place])    managed by Dr. Dwyane Dee ([facility])--this is accurate as of 02/2018, when pt last saw Dr. Dwyane Dee.  . Diverticulosis   . Generalized anxiety disorder 08/11/2012  . Hemorrhoids    ext and int  . Hyperlipemia, mixed    elev trigs with low HDL. Fish oil OTC.  Statin started in Fall 2018.  Fenofibrate started 07/2018  . Hypertension 2017  . Hypogonadotropic hypogonadism ([place]) 04/08/2011   As of 2017/18/19, Dr. Dwyane Dee (endo) is following: he started clomiph but no better at f/u 11/2016.  Pt  denied any improvemt,though testost improved.  11/2017 Clomiphene d/c'd-topical testo started. His hypogonadism is due to insulin resistance syndrome.  As of 08/2017 endo f/u: pt staying on clomiphene (no testost) and is feeling some improved.  No imp 11/2017 f/u on topical testost (off clom)  . Insomnia   . Internal hemorrhoids, bleeding and sicharge 05/29/2013  . OSA on CPAP    setting changed to 6 cm H20 (fixed) 10/2016 by pulm.  . Rotator cuff tear 2011   [facility]: conservative mgmt--symptoms stable as of 04/2011.    Past Surgical History:  Procedure Laterality Date  . adnoids    . CARDIOVASCULAR STRESS TEST     Normal stress nuclear study, EF normal. +Hypertensive bp response--subsequent 24H ambulatory BP monitoring confirmed HTN.  . COLONOSCOPY  05/2006, 05/2012  . HEMORRHOID BANDING    . POLYPECTOMY     Adenomatous 2007; hyperplastic 2013.  Repeat 5 yrs.  . TRANSTHORACIC ECHOCARDIOGRAM  01/30/16   Normal except mild aortic dilatation  . WISDOM TOOTH EXTRACTION      Family History  Problem Relation Age of Onset  . Hypertension Mother   . Diabetes Mother   . CVA  Mother   . Hypertension Father   . Diabetes Father   . Dementia Father   . Heart disease Father   . CAD Father     Social History:  reports that he has never smoked. He has never used smokeless tobacco. He reports current alcohol use. He reports that he does not use drugs.  Review of Systems  HYPERLIPIDEMIA: Has mixed hyperlipidemia  He has been on Lipitor and fenofibrate, triglycerides are persistently high   Recently had some difficulty getting his prescription filled from the pharmacy Also his labs were done nonfasting  LDL under 100  Lab Results  Component Value Date   CHOL 147 03/14/2020   CHOL 156 06/14/2019   CHOL 147 11/07/2018   Lab Results  Component Value Date   HDL 29.50 (L) 03/14/2020   HDL 33.50 (L) 06/14/2019   HDL 26.30 (L) 11/07/2018   Lab Results  Component Value Date   LDLCALC 87 06/14/2019   Lab Results  Component Value Date   TRIG 252.0 (H) 03/14/2020   TRIG 179.0 (H) 06/14/2019   TRIG 290.0 (H) 11/07/2018   Lab Results  Component Value Date   CHOLHDL 5 03/14/2020   CHOLHDL 5 06/14/2019   CHOLHDL 6 11/07/2018   Lab Results  Component Value Date   LDLDIRECT 90.0 03/14/2020   LDLDIRECT 79.0 11/07/2018   LDLDIRECT 42.0 07/06/2018    HYPERTENSION:    This is being treated by PCP   He is on valsartan 160 mg daily  Blood pressure is fairly good at [name] usually This is despite his being out of his valsartan recently  BP Readings from Last 3 Encounters:  03/21/20 (!) 132/76  06/16/19 130/74  06/08/19 (!) 145/94   Creatinine has   Lab Results  Component Value Date   CREATININE 0.96 03/14/2020   CREATININE 1.00 09/13/2019   CREATININE 1.34 06/14/2019      General Examination:   BP (!) 132/76 (BP Location: Left Arm, Patient Position: Sitting, Cuff Size: Normal)   Pulse 80   Ht 5\' 10"  (1.778 m)   Wt (!) 203 lb (92.1 kg)   SpO2 95%   BMI 29.13 kg/m   Diabetic Foot Exam - Simple   Simple Foot Form Diabetic Foot exam  was performed with the following findings: Yes  Visual Inspection No deformities, no ulcerations, no other skin breakdown bilaterally: Yes Sensation Testing Intact to touch and monofilament testing bilaterally: Yes Pulse Check Posterior Tibialis and Dorsalis pulse intact bilaterally: Yes Comments    No edema present  Assessment/ Plan:    Hypogonadotropic hypogonadism, long-standing  This is secondary to insulin resistance syndrome  Continues to be on Androderm instead of AndroGel since 10/20  Testosterone level which previously was just over 500 is now down below 150 He is subjectively doing better No change in hemoglobin, also PSA normal  Although no change was recommended on his last visit he is not in applying the patches consistently as discussed above This is partly because of mild skin irritation He was offered to change to another product like Harlene Salts but he wants to continue Discussed importance of applying the patches for 24 hours consistently At the most if he has more irritation he may leave off 1 patch during the day at times  Follow-up levels in about 6 weeks  HYPERLIPIDEMIA: Has mixed hyperlipidemia with increased triglycerides, partly related to being not able to get his fenofibrate Also labs were nonfasting  DIABETES type 2:  A1c is still below 7  We will try to check readings after meals more often No change in his regimen of Ozempic, Metformin Wilder Glade He will be given a new prescription for Metformin which he has not been able to get regularly from his pharmacy currently Discussed that he could do better with more weight loss and he will try to set up some exercise regimen also  Follow-up in 6 months unless blood sugars start going up  HYPERTENSION: On Diovan and even with not taking this regularly his blood pressure is not high He can try taking 80 mg daily which will also reduce chances of microalbuminuria    There are no Patient Instructions  on file for this visit.   Elayne Snare 03/21/2020, 10:49 AM

## 2020-04-28 ENCOUNTER — Other Ambulatory Visit: Payer: Self-pay | Admitting: Endocrinology

## 2020-05-02 ENCOUNTER — Other Ambulatory Visit: Payer: Self-pay

## 2020-05-02 ENCOUNTER — Other Ambulatory Visit (INDEPENDENT_AMBULATORY_CARE_PROVIDER_SITE_OTHER): Payer: 59

## 2020-05-02 DIAGNOSIS — E230 Hypopituitarism: Secondary | ICD-10-CM | POA: Diagnosis not present

## 2020-05-02 LAB — TESTOSTERONE: Testosterone: 106.69 ng/dL — ABNORMAL LOW (ref 300.00–890.00)

## 2020-05-07 ENCOUNTER — Other Ambulatory Visit: Payer: Self-pay | Admitting: Endocrinology

## 2020-05-22 ENCOUNTER — Other Ambulatory Visit: Payer: Self-pay | Admitting: Endocrinology

## 2020-06-06 ENCOUNTER — Other Ambulatory Visit: Payer: Self-pay | Admitting: Endocrinology

## 2020-06-06 ENCOUNTER — Other Ambulatory Visit: Payer: Self-pay | Admitting: Family Medicine

## 2020-06-06 NOTE — Telephone Encounter (Signed)
Dr. Dwyane Dee changed his dose of valsartan to 80mg  July this year. I have not seen this patient in over 18 months. Needs to address this med with Dr. Dwyane Dee. I'm going to deny the RF.

## 2020-06-06 NOTE — Telephone Encounter (Signed)
Please advise, Dr. Dwyane Dee is his endocrinologist.

## 2020-06-13 ENCOUNTER — Other Ambulatory Visit: Payer: Self-pay | Admitting: Endocrinology

## 2020-06-13 ENCOUNTER — Other Ambulatory Visit: Payer: Self-pay

## 2020-06-13 ENCOUNTER — Other Ambulatory Visit (INDEPENDENT_AMBULATORY_CARE_PROVIDER_SITE_OTHER): Payer: 59

## 2020-06-13 DIAGNOSIS — E1165 Type 2 diabetes mellitus with hyperglycemia: Secondary | ICD-10-CM

## 2020-06-13 DIAGNOSIS — E782 Mixed hyperlipidemia: Secondary | ICD-10-CM

## 2020-06-13 DIAGNOSIS — E230 Hypopituitarism: Secondary | ICD-10-CM

## 2020-06-13 LAB — LDL CHOLESTEROL, DIRECT: Direct LDL: 95 mg/dL

## 2020-06-13 LAB — COMPREHENSIVE METABOLIC PANEL
ALT: 19 U/L (ref 0–53)
AST: 17 U/L (ref 0–37)
Albumin: 4.4 g/dL (ref 3.5–5.2)
Alkaline Phosphatase: 52 U/L (ref 39–117)
BUN: 19 mg/dL (ref 6–23)
CO2: 26 mEq/L (ref 19–32)
Calcium: 9.6 mg/dL (ref 8.4–10.5)
Chloride: 101 mEq/L (ref 96–112)
Creatinine, Ser: 1.15 mg/dL (ref 0.40–1.50)
GFR: 68.21 mL/min (ref >60.00)
Glucose, Bld: 178 mg/dL — ABNORMAL HIGH (ref 70–99)
Potassium: 4.5 mEq/L (ref 3.5–5.1)
Sodium: 138 mEq/L (ref 135–145)
Total Bilirubin: 0.4 mg/dL (ref 0.2–1.2)
Total Protein: 7 g/dL (ref 6.0–8.3)

## 2020-06-13 LAB — LIPID PANEL
Cholesterol: 156 mg/dL (ref 0–200)
HDL: 29 mg/dL — ABNORMAL LOW (ref >39.00)
Total CHOL/HDL Ratio: 5
Triglycerides: 433 mg/dL — ABNORMAL HIGH (ref 0.0–149.0)

## 2020-06-13 LAB — MICROALBUMIN / CREATININE URINE RATIO
Creatinine,U: 66.8 mg/dL
Microalb Creat Ratio: 1 mg/g (ref 0.0–30.0)
Microalb, Ur: <0.7 mg/dL (ref 0.0–1.9)

## 2020-06-13 LAB — HEMOGLOBIN A1C: Hgb A1c MFr Bld: 6.8 % — ABNORMAL HIGH (ref 4.6–6.5)

## 2020-06-13 LAB — TESTOSTERONE: Testosterone: 466.1 ng/dL (ref 300.00–890.00)

## 2020-06-13 LAB — TSH: TSH: 1.03 u[IU]/mL (ref 0.35–4.50)

## 2020-06-13 LAB — T4, FREE: Free T4: 0.68 ng/dL (ref 0.60–1.60)

## 2020-09-01 ENCOUNTER — Other Ambulatory Visit: Payer: Self-pay | Admitting: Endocrinology

## 2020-09-10 ENCOUNTER — Other Ambulatory Visit: Payer: Self-pay | Admitting: Endocrinology

## 2020-09-13 ENCOUNTER — Other Ambulatory Visit: Payer: Self-pay

## 2020-09-16 ENCOUNTER — Other Ambulatory Visit: Payer: Self-pay | Admitting: Endocrinology

## 2020-09-16 DIAGNOSIS — E782 Mixed hyperlipidemia: Secondary | ICD-10-CM

## 2020-09-16 DIAGNOSIS — E230 Hypopituitarism: Secondary | ICD-10-CM

## 2020-09-16 DIAGNOSIS — E1165 Type 2 diabetes mellitus with hyperglycemia: Secondary | ICD-10-CM

## 2020-09-17 ENCOUNTER — Other Ambulatory Visit: Payer: 59

## 2020-09-17 ENCOUNTER — Other Ambulatory Visit: Payer: Self-pay

## 2020-09-17 ENCOUNTER — Other Ambulatory Visit (INDEPENDENT_AMBULATORY_CARE_PROVIDER_SITE_OTHER): Payer: 59

## 2020-09-17 DIAGNOSIS — E782 Mixed hyperlipidemia: Secondary | ICD-10-CM | POA: Diagnosis not present

## 2020-09-17 DIAGNOSIS — E230 Hypopituitarism: Secondary | ICD-10-CM

## 2020-09-17 DIAGNOSIS — E1165 Type 2 diabetes mellitus with hyperglycemia: Secondary | ICD-10-CM | POA: Diagnosis not present

## 2020-09-17 LAB — BASIC METABOLIC PANEL
BUN: 22 mg/dL (ref 6–23)
CO2: 25 mEq/L (ref 19–32)
Calcium: 9.7 mg/dL (ref 8.4–10.5)
Chloride: 105 mEq/L (ref 96–112)
Creatinine, Ser: 1.06 mg/dL (ref 0.40–1.50)
GFR: 75.78 mL/min (ref >60.00)
Glucose, Bld: 202 mg/dL — ABNORMAL HIGH (ref 70–99)
Potassium: 4.2 mEq/L (ref 3.5–5.1)
Sodium: 139 mEq/L (ref 135–145)

## 2020-09-17 LAB — LIPID PANEL
Cholesterol: 151 mg/dL (ref 0–200)
HDL: 27.8 mg/dL — ABNORMAL LOW (ref >39.00)
NonHDL: 123.4
Total CHOL/HDL Ratio: 5
Triglycerides: 393 mg/dL — ABNORMAL HIGH (ref 0.0–149.0)
VLDL: 78.6 mg/dL — ABNORMAL HIGH (ref 0.0–40.0)

## 2020-09-17 LAB — CBC
HCT: 46.8 % (ref 39.0–52.0)
Hemoglobin: 15.4 g/dL (ref 13.0–17.0)
MCHC: 32.9 g/dL (ref 30.0–36.0)
MCV: 82.3 fl (ref 78.0–100.0)
Platelets: 303 10*3/uL (ref 150.0–400.0)
RBC: 5.69 Mil/uL (ref 4.22–5.81)
RDW: 14.5 % (ref 11.5–15.5)
WBC: 8.5 10*3/uL (ref 4.0–10.5)

## 2020-09-17 LAB — LDL CHOLESTEROL, DIRECT: Direct LDL: 87 mg/dL

## 2020-09-17 LAB — TESTOSTERONE: Testosterone: 119.03 ng/dL — ABNORMAL LOW (ref 300.00–890.00)

## 2020-09-17 LAB — HEMOGLOBIN A1C: Hgb A1c MFr Bld: 7.7 % — ABNORMAL HIGH (ref 4.6–6.5)

## 2020-09-19 ENCOUNTER — Other Ambulatory Visit: Payer: Self-pay

## 2020-09-19 ENCOUNTER — Ambulatory Visit: Payer: 59 | Admitting: Endocrinology

## 2020-09-19 ENCOUNTER — Other Ambulatory Visit: Payer: Self-pay | Admitting: Endocrinology

## 2020-09-19 ENCOUNTER — Encounter: Payer: Self-pay | Admitting: Endocrinology

## 2020-09-19 VITALS — BP 135/82 | HR 95 | Ht 70.0 in | Wt 205.2 lb

## 2020-09-19 DIAGNOSIS — E1165 Type 2 diabetes mellitus with hyperglycemia: Secondary | ICD-10-CM

## 2020-09-19 DIAGNOSIS — Z23 Encounter for immunization: Secondary | ICD-10-CM | POA: Diagnosis not present

## 2020-09-19 DIAGNOSIS — E230 Hypopituitarism: Secondary | ICD-10-CM

## 2020-09-19 DIAGNOSIS — E782 Mixed hyperlipidemia: Secondary | ICD-10-CM

## 2020-09-19 DIAGNOSIS — I10 Essential (primary) hypertension: Secondary | ICD-10-CM

## 2020-09-19 MED ORDER — ANDRODERM 4 MG/24HR TD PT24
MEDICATED_PATCH | TRANSDERMAL | 2 refills | Status: DC
Start: 2020-09-19 — End: 2021-01-21

## 2020-09-19 NOTE — Progress Notes (Addendum)
Patient ID: Douglas [name], male   DOB: 02-Nov-1960, 60 y.o.   MRN: [id number]           Chief complaint: Endocrinology follow-up    History of Present Illness  PROBLEM 1:  Hypogonadism: This was diagnosed in 2009  He was apparently initially seen by his psychiatrist and was having difficulties with fatigue, agoraphobia and decreased libido as well as sexual dysfunction. He was told that his testosterone level was significantly low and was given testosterone supplements probably with AndroGel Subsequently the patient has been treated by various physicians including the urologist with testosterone supplements, mostly AndroGel He thinks that his testosterone levels have never been normal with AndroGel although he thinks that he felt a little better with taking this from his urologist who called him to apply it on his abdomen  He was also tried on monthly injections for about 3 months without consistent benefit also In 2016 he was given [place] again without any benefit   RECENT history: He had baseline complaints offatigue, decreased motivation, decreased libido and erectile dysfunction from hypogonadism  Initial evaluation showed low free testosterone of 4.5 and normal LH  He had been on clomiphene 25 mg daily after his initial consultation in 09/2015 Because of continued fatigue, decreased libido and testosterone levels being on the low end of normal he was switched to ANDROGEL 3 pumps daily in 11/2017 He was having difficulty applying this in the mornings because of not having time before going to work With this his testosterone levels could be consistently low  He was switched to Androderm, 8 mg daily in October 2020  With the treatment he had improved energy level and less weakness He does get some skin irritation at the site of application this is tolerable and he keeps moving the site of application Is applying this regularly after shower every morning However he ran out  of his prescription about a week ago and has not used it, his level is low at 119 He says he cannot tell the difference when he is not taking the patches since he has some recent insomnia  While taking the patches regularly in 10/21 his testosterone was excellent at 466  Hemoglobin stable   His lab results as follows:  Lab Results  Component Value Date   TESTOSTERONE 119.03 (L) 09/17/2020   TESTOSTERONE 466.10 06/13/2020   TESTOSTERONE 106.69 (L) 05/02/2020   TESTOSTERONE 137.22 (L) 03/14/2020     Lab Results  Component Value Date   LH 4.30 11/07/2018   Lab Results  Component Value Date   HGB 15.4 09/17/2020    DIABETES type II: He had significant hyperglycemia at onset with A1c about 13  Since he was not benefiting from Actos and metformin given by his PCP he was given Invokana in addition in August 2018 Since 07/2017 he has been on Ozempic  Non-insulin HYPOGLYCEMIC regimen: Farxiga 10 mg daily, metformin 1000 mg twice daily and Ozempic 0.5 mg weekly  His A1c has gone up to 7.7 now compared to 6.6  Current management, problems identified and blood sugar patterns:  He does not check his blood sugars regularly and none recently  Although he thinks he is overall walking quite a bit at work his weight has gradually gone up  He is tending to eat out or eat precooked meals recently and does not think he is consistently cutting back on carbohydrates and high fat foods  Nonfasting lab glucose was 202 after lunch  His last consultation with  dietitian was in 2016  Blood sugars at [name]: Fasting range recently 123, 126, was 142 last month Suppertime 136     Wt Readings from Last 3 Encounters:  09/19/20 205 lb 3.2 oz (93.1 kg)  03/21/20 (!) 203 lb (92.1 kg)  06/16/19 200 lb 12.8 oz (91.1 kg)     Lab Results  Component Value Date   HGBA1C 7.7 (H) 09/17/2020   HGBA1C 6.8 (H) 06/13/2020   HGBA1C 6.9 (H) 03/14/2020   Lab Results  Component Value Date    MICROALBUR <0.7 06/13/2020   [place] 87 06/14/2019   CREATININE 1.06 09/17/2020   No other active problems: See review of systems   Allergies as of 09/19/2020      Reactions   Molds & Smuts Other (See Comments)      Medication List       Accurate as of September 19, 2020 12:02 PM. If you have any questions, ask your nurse or doctor.        Androderm 4 MG/24HR Pt24 patch Generic drug: testosterone PLACE 2 PATCHES ONTO THE SKIN DAILY.   Farxiga 10 MG Tabs tablet Generic drug: dapagliflozin propanediol TAKE 10 MG BY MOUTH DAILY.   fenofibrate 145 MG tablet Commonly known as: TRICOR TAKE 1 TABLET BY MOUTH EVERY DAY   glucose blood test strip Check glucose bid   metFORMIN 1000 MG tablet Commonly known as: GLUCOPHAGE TAKE 1 TABLET (1,000 MG TOTAL) BY MOUTH 2 (TWO) TIMES DAILY WITH A MEAL.   Ozempic (0.25 or 0.5 MG/DOSE) 2 MG/1.5ML Sopn Generic drug: Semaglutide(0.25 or 0.5MG /DOS) INJECT 0.5 MG INTO THE SKIN ONCE A WEEK.   valsartan 80 MG tablet Commonly known as: DIOVAN TAKE 1 TABLET BY MOUTH EVERY DAY       Allergies:  Allergies  Allergen Reactions  . Molds & Smuts Other (See Comments)    Past Medical History:  Diagnosis Date  . 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  . Adenomatous colon polyp 05/07/06; 05/2012   Repeat colonoscopy 05/17/2012 showed 2 small polyps that were removed--path showed Hyperplastic-not adenomatous.  Still needs repeat 5 yrs.  . Angioedema of lips    saw allergist 11/2011  . Asthmatic bronchitis , chronic ([place]) 01/11/2012   Question occupational asthma with no specific agents identified. He does notice that some areas of his job are particularly dusty and dirty. Currently he is well controlled using Symbicort through an AeroChamber and rarely needing his rescue inhaler. This would be an acceptable long-term status. Elevated nonspecific total IgE and peripheral  eosinophilia to suggest an atopic trigger with sensitization to something Consider Daliresp for future use if needed   . Chronic fatigue    +daytime somnolence  . Chronic recurrent sinusitis 02/28/2012  . Depression    Prozac in the past not much help.  Spontaneously resolved.  . Diabetes mellitus without complication ([place])    managed by Dr. Dwyane Dee ([facility])--this is accurate as of 02/2018, when pt last saw Dr. Dwyane Dee.  . Diverticulosis   . Generalized anxiety disorder 08/11/2012  . Hemorrhoids    ext and int  . Hyperlipemia, mixed    elev trigs with low HDL. Fish oil OTC.  Statin started in Fall 2018.  Fenofibrate started 07/2018  . Hypertension 2017  . Hypogonadotropic hypogonadism ([place]) 04/08/2011   As of 2017/18/19, Dr. Dwyane Dee ([facility]) is following: he started clomiph but no better at f/u 11/2016.  Pt  denied any improvemt,though testost improved.  11/2017 Clomiphene d/c'd-topical testo started. His hypogonadism is due to insulin resistance syndrome.  As of 08/2017 endo f/u: pt staying on clomiphene (no testost) and is feeling some improved.  No imp 11/2017 f/u on topical testost (off clom)  . Insomnia   . Internal hemorrhoids, bleeding and sicharge 05/29/2013  . OSA on CPAP    setting changed to 6 cm H20 (fixed) 10/2016 by pulm.  . Rotator cuff tear 2011   [facility]: conservative mgmt--symptoms stable as of 04/2011.    Past Surgical History:  Procedure Laterality Date  . adnoids    . CARDIOVASCULAR STRESS TEST     Normal stress nuclear study, EF normal. +Hypertensive bp response--subsequent 24H ambulatory BP monitoring confirmed HTN.  . COLONOSCOPY  05/2006, 05/2012  . HEMORRHOID BANDING    . POLYPECTOMY     Adenomatous 2007; hyperplastic 2013.  Repeat 5 yrs.  . TRANSTHORACIC ECHOCARDIOGRAM  01/30/16   Normal except mild aortic dilatation  . WISDOM TOOTH EXTRACTION      Family History  Problem Relation Age of Onset  . Hypertension Mother   . Diabetes Mother   . CVA Mother   .  Hypertension Father   . Diabetes Father   . Dementia Father   . Heart disease Father   . CAD Father     Social History:  reports that he has never smoked. He has never used smokeless tobacco. He reports current alcohol use. He reports that he does not use drugs.  Review of Systems  HYPERLIPIDEMIA: Has mixed hyperlipidemia  He has been on Lipitor and fenofibrate and he thinks he is taking these regularly  Triglycerides still significantly high Also his labs were done nonfasting  LDL under 100  Lab Results  Component Value Date   CHOL 151 09/17/2020   CHOL 156 06/13/2020   CHOL 147 03/14/2020   Lab Results  Component Value Date   HDL 27.80 (L) 09/17/2020   HDL 29.00 (L) 06/13/2020   HDL 29.50 (L) 03/14/2020   Lab Results  Component Value Date   LDLCALC 87 06/14/2019   Lab Results  Component Value Date   TRIG 393.0 (H) 09/17/2020   TRIG (H) 06/13/2020    433.0 Triglyceride is over 400; calculations on Lipids are invalid.   TRIG 252.0 (H) 03/14/2020   Lab Results  Component Value Date   CHOLHDL 5 09/17/2020   CHOLHDL 5 06/13/2020   CHOLHDL 5 03/14/2020   Lab Results  Component Value Date   LDLDIRECT 87.0 09/17/2020   LDLDIRECT 95.0 06/13/2020   LDLDIRECT 90.0 03/14/2020    HYPERTENSION:     He is on valsartan 80 mg daily, previously had been irregular with his prescriptions  Blood pressure is 135/?  At [name]   BP Readings from Last 3 Encounters:  09/19/20 135/82  03/21/20 (!) 132/76  06/16/19 130/74   Creatinine ok  Lab Results  Component Value Date   CREATININE 1.06 09/17/2020   CREATININE 1.15 06/13/2020   CREATININE 0.96 03/14/2020      General Examination:   BP 135/82   Pulse 95   Ht 5\' 10"  (1.778 m)   Wt 205 lb 3.2 oz (93.1 kg)   SpO2 95%   BMI 29.44 kg/m    Assessment/ Plan:    Hypogonadotropic hypogonadism, long-standing  This is secondary to insulin resistance syndrome  He has been on Androderm 8 mg daily since  06/2019  Although he is generally doing better with  supplementation his testosterone level is low now because of running out of his prescription He is otherwise getting therapeutic levels when he takes his Androderm patches regularly as of 10/21 He is able to use the patch consistently although he has had some skin irritation which he is able to deal with  Recommended continuing the same dose of 8 mg daily and using the patches consistently He will let us know if he is running out of refills  HYPERLIPIDEMIA: Has mixed hyperlipidemia with increased triglycerides, labs done nonfasting However he can do better with his diet especially carbohydrates and high fat meals Consider adding Vascepa on the next visit if fasting triglycerides are still high  DIABETES type 2:  A1c is higher at 7.7  This is partly related to inconsistent diet recently However can benefit from 1 mg Ozempic and will consider increasing his dose  HYPERTENSION: Blood pressure is relatively good on valsartan 80 mg He will check more consistently at [name] and let us know if his diastolic readings are consistently high  Needs regular follow-up in about 3 months  Influenza vaccine given  Patient Instructions  Check blood sugars on waking up 2-3 days a week  Also check blood sugars about 2 hours after meals and do this after different meals by rotation  Recommended blood sugar levels on waking up are 90-130 and about 2 hours after meal is 130-180  Please bring your blood sugar monitor to each visit, thank you  Low fat meals  Ozempic 2 shots of 0.5mg  same time then 1mg  dose      Elayne Snare 09/19/2020, 12:02 PM

## 2020-09-19 NOTE — Patient Instructions (Signed)
Check blood sugars on waking up 2-3 days a week  Also check blood sugars about 2 hours after meals and do this after different meals by rotation  Recommended blood sugar levels on waking up are 90-130 and about 2 hours after meal is 130-180  Please bring your blood sugar monitor to each visit, thank you  Low fat meals  Ozempic 2 shots of 0.5mg  same time then 1mg  dose

## 2020-10-20 ENCOUNTER — Other Ambulatory Visit: Payer: Self-pay | Admitting: Endocrinology

## 2020-11-08 ENCOUNTER — Other Ambulatory Visit: Payer: Self-pay | Admitting: Endocrinology

## 2020-12-12 ENCOUNTER — Other Ambulatory Visit: Payer: 59

## 2020-12-17 ENCOUNTER — Ambulatory Visit: Payer: 59 | Admitting: Endocrinology

## 2021-01-02 ENCOUNTER — Other Ambulatory Visit: Payer: Self-pay

## 2021-01-02 ENCOUNTER — Other Ambulatory Visit (INDEPENDENT_AMBULATORY_CARE_PROVIDER_SITE_OTHER): Payer: 59

## 2021-01-02 DIAGNOSIS — E230 Hypopituitarism: Secondary | ICD-10-CM | POA: Diagnosis not present

## 2021-01-02 DIAGNOSIS — E1165 Type 2 diabetes mellitus with hyperglycemia: Secondary | ICD-10-CM

## 2021-01-02 DIAGNOSIS — E782 Mixed hyperlipidemia: Secondary | ICD-10-CM

## 2021-01-02 LAB — LIPID PANEL
Cholesterol: 160 mg/dL (ref 0–200)
HDL: 29.6 mg/dL — ABNORMAL LOW (ref >39.00)
NonHDL: 130.45
Total CHOL/HDL Ratio: 5
Triglycerides: 274 mg/dL — ABNORMAL HIGH (ref 0.0–149.0)
VLDL: 54.8 mg/dL — ABNORMAL HIGH (ref 0.0–40.0)

## 2021-01-02 LAB — BASIC METABOLIC PANEL
BUN: 18 mg/dL (ref 6–23)
CO2: 26 mEq/L (ref 19–32)
Calcium: 9.4 mg/dL (ref 8.4–10.5)
Chloride: 102 mEq/L (ref 96–112)
Creatinine, Ser: 1.06 mg/dL (ref 0.40–1.50)
GFR: 75.62 mL/min (ref >60.00)
Glucose, Bld: 141 mg/dL — ABNORMAL HIGH (ref 70–99)
Potassium: 4 mEq/L (ref 3.5–5.1)
Sodium: 137 mEq/L (ref 135–145)

## 2021-01-02 LAB — TESTOSTERONE: Testosterone: 381.53 ng/dL (ref 300.00–890.00)

## 2021-01-02 LAB — LDL CHOLESTEROL, DIRECT: Direct LDL: 101 mg/dL

## 2021-01-02 LAB — HEMOGLOBIN A1C: Hgb A1c MFr Bld: 8 % — ABNORMAL HIGH (ref 4.6–6.5)

## 2021-01-06 ENCOUNTER — Other Ambulatory Visit: Payer: Self-pay | Admitting: Endocrinology

## 2021-01-08 ENCOUNTER — Ambulatory Visit: Payer: 59 | Admitting: Endocrinology

## 2021-01-08 ENCOUNTER — Encounter: Payer: Self-pay | Admitting: Endocrinology

## 2021-01-08 ENCOUNTER — Other Ambulatory Visit: Payer: Self-pay

## 2021-01-08 VITALS — BP 138/84 | HR 85 | Ht 70.0 in | Wt 206.6 lb

## 2021-01-08 DIAGNOSIS — I10 Essential (primary) hypertension: Secondary | ICD-10-CM

## 2021-01-08 DIAGNOSIS — E1165 Type 2 diabetes mellitus with hyperglycemia: Secondary | ICD-10-CM | POA: Diagnosis not present

## 2021-01-08 DIAGNOSIS — E230 Hypopituitarism: Secondary | ICD-10-CM | POA: Diagnosis not present

## 2021-01-08 DIAGNOSIS — E782 Mixed hyperlipidemia: Secondary | ICD-10-CM

## 2021-01-08 NOTE — Progress Notes (Signed)
Patient ID: Douglas [name], male   DOB: [birth year], 60 y.o.   MRN: [id number]           Chief complaint: Endocrinology follow-up    History of Present Illness  PROBLEM 1:  Hypogonadism: This was diagnosed in 2009  He was apparently initially seen by his psychiatrist and was having difficulties with fatigue, agoraphobia and decreased libido as well as sexual dysfunction. He was told that his testosterone level was significantly low and was given testosterone supplements probably with AndroGel Subsequently the patient has been treated by various physicians including the urologist with testosterone supplements, mostly AndroGel He thinks that his testosterone levels have never been normal with AndroGel although he thinks that he felt a little better with taking this from his urologist who called him to apply it on his abdomen  He was also tried on monthly injections for about 3 months without consistent benefit also In 2016 he was given [place] again without any benefit   RECENT history: He had baseline complaints offatigue, decreased motivation, decreased libido and erectile dysfunction from hypogonadism  Initial evaluation showed low free testosterone of 4.5 and normal LH  He had been on clomiphene 25 mg daily after his initial consultation in 09/2015 Because of continued fatigue, decreased libido and testosterone levels being on the low end of normal he was switched to ANDROGEL 3 pumps daily in 11/2017 He was having difficulty applying this in the mornings because of not having time before going to work With this his testosterone levels could be consistently low  He was switched to Androderm, 8 mg daily in October 2020  With the treatment he had improved energy level and less weakness He does get some skin irritation at the site of application which is tolerable  Is applying this regularly after shower every morning On the last visit he ran out of his prescription about a week  before and his level was down to 119 However now with regular application his level is back up to 381  He still thinks that he has a little weakness  Hemoglobin is below   His lab results as follows:  Lab Results  Component Value Date   TESTOSTERONE 381.53 01/02/2021   TESTOSTERONE 119.03 (L) 09/17/2020   TESTOSTERONE 466.10 06/13/2020   TESTOSTERONE 106.69 (L) 05/02/2020     Lab Results  Component Value Date   LH 4.30 11/07/2018   Lab Results  Component Value Date   HGB 15.4 09/17/2020    DIABETES type II: He had significant hyperglycemia at onset with A1c about 13  Since he was not benefiting from Actos and metformin given by his PCP he was given Invokana in addition in August 2018 Since 07/2017 he has been on Ozempic  Non-insulin HYPOGLYCEMIC regimen: Farxiga 10 mg daily, metformin 1000 mg twice daily and Ozempic 0.5 mg weekly  His A1c has gone up to 8% and has been as low as 6.3 in the past  Current management, problems identified and blood sugar patterns:  He does not check his blood sugars except rarely and usually not after meals  He thinks his blood sugars are poorly controlled because of not watching his diet  He is using prepackaged foods or eating out and he thinks he has relatively higher fat meals such as biscuits  He has been trying to do some walking lately  Has not missed his Ozempic doses  Renal function was normal with continuing Farxiga  However his weight is about the same  Morning lab glucose was 141   His last consultation with dietitian was in 2016  Blood sugars at [name]: Fasting range recently 137, 150      Wt Readings from Last 3 Encounters:  01/08/21 206 lb 9.6 oz (93.7 kg)  09/19/20 205 lb 3.2 oz (93.1 kg)  03/21/20 (!) 203 lb (92.1 kg)     Lab Results  Component Value Date   HGBA1C 8.0 (H) 01/02/2021   HGBA1C 7.7 (H) 09/17/2020   HGBA1C 6.8 (H) 06/13/2020   Lab Results  Component Value Date   MICROALBUR <0.7  06/13/2020   [place] 87 06/14/2019   CREATININE 1.06 01/02/2021   No other active problems: See review of systems   Allergies as of 01/08/2021      Reactions   Molds & Smuts Other (See Comments)      Medication List       Accurate as of Jan 08, 2021 12:02 PM. If you have any questions, ask your nurse or doctor.        STOP taking these medications   Ozempic (0.25 or 0.5 MG/DOSE) 2 MG/1.5ML Sopn Generic drug: Semaglutide(0.25 or 0.5MG /DOS) Stopped by: Elayne Snare, MD     TAKE these medications   Androderm 4 MG/24HR Pt24 patch Generic drug: testosterone PLACE 2 PATCHES ONTO THE SKIN DAILY.   Farxiga 10 MG Tabs tablet Generic drug: dapagliflozin propanediol TAKE 1 TABLET BY MOUTH EVERY DAY   fenofibrate 145 MG tablet Commonly known as: TRICOR TAKE 1 TABLET BY MOUTH EVERY DAY   glucose blood test strip Check glucose bid   metFORMIN 1000 MG tablet Commonly known as: GLUCOPHAGE TAKE 1 TABLET (1,000 MG TOTAL) BY MOUTH 2 (TWO) TIMES DAILY WITH A MEAL.   valsartan 80 MG tablet Commonly known as: DIOVAN TAKE 1 TABLET BY MOUTH EVERY DAY       Allergies:  Allergies  Allergen Reactions  . Molds & Smuts Other (See Comments)    Past Medical History:  Diagnosis Date  . 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  . Adenomatous colon polyp 05/07/06; 05/2012   Repeat colonoscopy 05/17/2012 showed 2 small polyps that were removed--path showed Hyperplastic-not adenomatous.  Still needs repeat 5 yrs.  . Angioedema of lips    saw allergist 11/2011  . Asthmatic bronchitis , chronic ([place]) 01/11/2012   Question occupational asthma with no specific agents identified. He does notice that some areas of his job are particularly dusty and dirty. Currently he is well controlled using Symbicort through an AeroChamber and rarely needing his rescue inhaler. This would be an acceptable long-term status. Elevated nonspecific  total IgE and peripheral eosinophilia to suggest an atopic trigger with sensitization to something Consider Daliresp for future use if needed   . Chronic fatigue    +daytime somnolence  . Chronic recurrent sinusitis 02/28/2012  . Depression    Prozac in the past not much help.  Spontaneously resolved.  . Diabetes mellitus without complication ([place])    managed by Dr. Dwyane Dee ([facility])--this is accurate as of 02/2018, when pt last saw Dr. Dwyane Dee.  . Diverticulosis   . Generalized anxiety disorder 08/11/2012  . Hemorrhoids    ext and int  . Hyperlipemia, mixed    elev trigs with low HDL. Fish oil OTC.  Statin started in Fall 2018.  Fenofibrate started 07/2018  . Hypertension 2017  . Hypogonadotropic hypogonadism ([place]) 04/08/2011   As of 2017/18/19,  Dr. Dwyane Dee ([facility]) is following: he started clomiph but no better at f/u 11/2016.  Pt denied any improvemt,though testost improved.  11/2017 Clomiphene d/c'd-topical testo started. His hypogonadism is due to insulin resistance syndrome.  As of 08/2017 endo f/u: pt staying on clomiphene (no testost) and is feeling some improved.  No imp 11/2017 f/u on topical testost (off clom)  . Insomnia   . Internal hemorrhoids, bleeding and sicharge 05/29/2013  . OSA on CPAP    setting changed to 6 cm H20 (fixed) 10/2016 by pulm.  . Rotator cuff tear 2011   [facility]: conservative mgmt--symptoms stable as of 04/2011.    Past Surgical History:  Procedure Laterality Date  . adnoids    . CARDIOVASCULAR STRESS TEST     Normal stress nuclear study, EF normal. +Hypertensive bp response--subsequent 24H ambulatory BP monitoring confirmed HTN.  . COLONOSCOPY  05/2006, 05/2012  . HEMORRHOID BANDING    . POLYPECTOMY     Adenomatous 2007; hyperplastic 2013.  Repeat 5 yrs.  . TRANSTHORACIC ECHOCARDIOGRAM  01/30/16   Normal except mild aortic dilatation  . WISDOM TOOTH EXTRACTION      Family History  Problem Relation Age of Onset  . Hypertension Mother   . Diabetes Mother   .  CVA Mother   . Hypertension Father   . Diabetes Father   . Dementia Father   . Heart disease Father   . CAD Father     Social History:  reports that he has never smoked. He has never used smokeless tobacco. He reports current alcohol use. He reports that he does not use drugs.  Review of Systems.......  HYPERLIPIDEMIA: Has mixed hyperlipidemia  He has been on Lipitor and fenofibrate and taking regularly  Triglycerides still high but his diet has not been good LDL which was consistently below 100 is higher also   Lab Results  Component Value Date   CHOL 160 01/02/2021   CHOL 151 09/17/2020   CHOL 156 06/13/2020   Lab Results  Component Value Date   HDL 29.60 (L) 01/02/2021   HDL 27.80 (L) 09/17/2020   HDL 29.00 (L) 06/13/2020   Lab Results  Component Value Date   LDLCALC 87 06/14/2019   Lab Results  Component Value Date   TRIG 274.0 (H) 01/02/2021   TRIG 393.0 (H) 09/17/2020   TRIG (H) 06/13/2020    433.0 Triglyceride is over 400; calculations on Lipids are invalid.   Lab Results  Component Value Date   CHOLHDL 5 01/02/2021   CHOLHDL 5 09/17/2020   CHOLHDL 5 06/13/2020   Lab Results  Component Value Date   LDLDIRECT 101.0 01/02/2021   LDLDIRECT 87.0 09/17/2020   LDLDIRECT 95.0 06/13/2020    HYPERTENSION:     He is on valsartan 80 mg daily, previously had been irregular with his prescriptions  Blood pressure is monitored at [name] also   BP Readings from Last 3 Encounters:  01/08/21 138/84  09/19/20 135/82  03/21/20 (!) 132/76   Creatinine history:  Lab Results  Component Value Date   CREATININE 1.06 01/02/2021   CREATININE 1.06 09/17/2020   CREATININE 1.15 06/13/2020      General Examination:   BP 138/84   Pulse 85   Ht 5\' 10"  (1.778 m)   Wt 206 lb 9.6 oz (93.7 kg)   SpO2 95%   BMI 29.64 kg/m    Assessment/ Plan:    Hypogonadotropic hypogonadism, long-standing  He likely has insulin resistance syndrome  He has been  on  Androderm 8 mg daily since 06/2019  He is applying this regularly and his testosterone level is now again therapeutic He does have some nonspecific weakness and fatigue which is likely for other reasons  Recommended continuing the same dose of 8 mg daily   HYPERLIPIDEMIA: Has mixed hyperlipidemia with increased triglycerides, and triglycerides are relatively better Likely his poor diet is causing some persistent high triglycerides as well as LDL He will try to work on his diet  DIABETES type 2:  A1c is higher at 8  This is partly related to poor diet and inconsistent exercise May benefit from 1 mg Ozempic and will increase his dose Offered consultation with dietitian but he thinks he can try following the previous plan that had been given to him Also given him a list of low saturated fat foods to cut back on fat intake Emphasized the need to check readings after meals consistently  HYPERTENSION: Blood pressure is fairly good on valsartan 80 mg He will need to monitor consistently at [name] also  Needs regular follow-up in about 3 months     Patient Instructions  Take 1mg  ozempic  Check blood sugars on waking up 2-3 days a week  Also check blood sugars about 2 hours after meals and do this after different meals by rotation  Recommended blood sugar levels on waking up are 90-130 and about 2 hours after meal is 130-160  Please bring your blood sugar monitor to each visit, thank you       Elayne Snare 01/08/2021, 12:02 PM

## 2021-01-08 NOTE — Patient Instructions (Signed)
Take 1mg  ozempic  Check blood sugars on waking up 2-3 days a week  Also check blood sugars about 2 hours after meals and do this after different meals by rotation  Recommended blood sugar levels on waking up are 90-130 and about 2 hours after meal is 130-160  Please bring your blood sugar monitor to each visit, thank you

## 2021-01-21 ENCOUNTER — Other Ambulatory Visit: Payer: Self-pay | Admitting: Endocrinology

## 2021-01-23 ENCOUNTER — Other Ambulatory Visit: Payer: Self-pay | Admitting: Endocrinology

## 2021-01-23 MED ORDER — ANDRODERM 4 MG/24HR TD PT24: MEDICATED_PATCH | TRANSDERMAL | 2 refills | Status: DC

## 2021-03-07 ENCOUNTER — Other Ambulatory Visit: Payer: Self-pay | Admitting: Endocrinology

## 2021-04-01 ENCOUNTER — Other Ambulatory Visit: Payer: Self-pay | Admitting: Endocrinology

## 2021-04-08 ENCOUNTER — Other Ambulatory Visit: Payer: Self-pay

## 2021-04-08 ENCOUNTER — Other Ambulatory Visit (INDEPENDENT_AMBULATORY_CARE_PROVIDER_SITE_OTHER): Payer: 59

## 2021-04-08 DIAGNOSIS — E782 Mixed hyperlipidemia: Secondary | ICD-10-CM

## 2021-04-08 DIAGNOSIS — E1165 Type 2 diabetes mellitus with hyperglycemia: Secondary | ICD-10-CM | POA: Diagnosis not present

## 2021-04-08 DIAGNOSIS — E230 Hypopituitarism: Secondary | ICD-10-CM

## 2021-04-08 LAB — LIPID PANEL
Cholesterol: 141 mg/dL (ref 0–200)
HDL: 30.7 mg/dL — ABNORMAL LOW (ref >39.00)
LDL Cholesterol: 70 mg/dL (ref 0–99)
NonHDL: 109.81
Total CHOL/HDL Ratio: 5
Triglycerides: 198 mg/dL — ABNORMAL HIGH (ref 0.0–149.0)
VLDL: 39.6 mg/dL (ref 0.0–40.0)

## 2021-04-08 LAB — COMPREHENSIVE METABOLIC PANEL
ALT: 21 U/L (ref 0–53)
AST: 17 U/L (ref 0–37)
Albumin: 4.5 g/dL (ref 3.5–5.2)
Alkaline Phosphatase: 44 U/L (ref 39–117)
BUN: 18 mg/dL (ref 6–23)
CO2: 25 mEq/L (ref 19–32)
Calcium: 9.4 mg/dL (ref 8.4–10.5)
Chloride: 104 mEq/L (ref 96–112)
Creatinine, Ser: 1.01 mg/dL (ref 0.40–1.50)
GFR: 79.99 mL/min (ref >60.00)
Glucose, Bld: 146 mg/dL — ABNORMAL HIGH (ref 70–99)
Potassium: 4.2 mEq/L (ref 3.5–5.1)
Sodium: 138 mEq/L (ref 135–145)
Total Bilirubin: 0.5 mg/dL (ref 0.2–1.2)
Total Protein: 7.3 g/dL (ref 6.0–8.3)

## 2021-04-08 LAB — TESTOSTERONE: Testosterone: 65.36 ng/dL — ABNORMAL LOW (ref 300.00–890.00)

## 2021-04-08 LAB — HEMOGLOBIN A1C: Hgb A1c MFr Bld: 6.9 % — ABNORMAL HIGH (ref 4.6–6.5)

## 2021-04-10 ENCOUNTER — Ambulatory Visit: Payer: 59 | Admitting: Endocrinology

## 2021-04-10 ENCOUNTER — Encounter: Payer: Self-pay | Admitting: Endocrinology

## 2021-04-10 ENCOUNTER — Other Ambulatory Visit: Payer: Self-pay

## 2021-04-10 VITALS — BP 138/76 | HR 77 | Ht 70.0 in | Wt 202.8 lb

## 2021-04-10 DIAGNOSIS — E782 Mixed hyperlipidemia: Secondary | ICD-10-CM

## 2021-04-10 DIAGNOSIS — E1165 Type 2 diabetes mellitus with hyperglycemia: Secondary | ICD-10-CM

## 2021-04-10 DIAGNOSIS — I10 Essential (primary) hypertension: Secondary | ICD-10-CM

## 2021-04-10 DIAGNOSIS — E230 Hypopituitarism: Secondary | ICD-10-CM

## 2021-04-10 MED ORDER — OZEMPIC (1 MG/DOSE) 2 MG/1.5ML ~~LOC~~ SOPN: 1.0000 mg | PEN_INJECTOR | SUBCUTANEOUS | 3 refills | Status: DC

## 2021-04-10 MED ORDER — ATORVASTATIN CALCIUM 10 MG PO TABS: 10.0000 mg | ORAL_TABLET | Freq: Every day | ORAL | 3 refills | Status: DC

## 2021-04-10 MED ORDER — NATESTO 5.5 MG/ACT NA GEL: NASAL | 3 refills | Status: DC

## 2021-04-10 NOTE — Progress Notes (Signed)
Patient ID: Douglas [name], male   DOB: 1961/08/21, 60 y.o.   MRN: [id number]           Chief complaint: Endocrinology follow-up    History of Present Illness  PROBLEM 1:  Hypogonadism: This was diagnosed in 2009  He was apparently initially seen by his psychiatrist and was having difficulties with fatigue, agoraphobia and decreased libido as well as sexual dysfunction. He was told that his testosterone level was significantly low and was given testosterone supplements probably with AndroGel Subsequently the patient has been treated by various physicians including the urologist with testosterone supplements, mostly AndroGel He thinks that his testosterone levels have never been normal with AndroGel although he thinks that he felt a little better with taking this from his urologist who called him to apply it on his abdomen  He was also tried on monthly injections for about 3 months without consistent benefit also In 2016 he was given [place] again without any benefit   RECENT history: He had baseline complaints of fatigue, decreased motivation, decreased libido and erectile dysfunction from hypogonadism  Initial evaluation showed low free testosterone of 4.5 and normal LH  He had been on clomiphene 25 mg daily after his initial consultation in 09/2015  Because of continued fatigue, decreased libido and testosterone levels being on the low end of normal he was switched to ANDROGEL 3 pumps daily in 11/2017 He was having difficulty applying this in the mornings because of not having time before going to work With this his testosterone levels could be consistently low  He was switched to Androderm, 8 mg daily in October 2020  With the treatment he had improved energy level and less weakness He does get some skin irritation at the site of application which is now less with applying it of the upper arm instead of the chest Is applying this regularly after shower every morning However  he thinks that sometimes the patches do not stick well and sometimes they get crinkled  Before his lab he missed applying the patch the day before However he does not complain of any fatigue or weakness Surprisingly his testosterone level is very low compared to normal levels previously    Hemoglobin is below   His lab results as follows:  Lab Results  Component Value Date   TESTOSTERONE 65.36 Repeated and verified X2. (L) 04/08/2021   TESTOSTERONE 381.53 01/02/2021   TESTOSTERONE 119.03 (L) 09/17/2020   TESTOSTERONE 466.10 06/13/2020     Lab Results  Component Value Date   LH 4.30 11/07/2018   Lab Results  Component Value Date   HGB 15.4 09/17/2020    DIABETES type II: He had significant hyperglycemia at onset with A1c about 13  Since he was not benefiting from Actos and metformin given by his PCP he was given Invokana in addition in August 2018 Since 07/2017 he has been on Ozempic  Non-insulin HYPOGLYCEMIC regimen: Farxiga 10 mg daily, metformin 1000 mg twice daily and Ozempic 0.5 mg weekly  His A1c had gone up to 8% and now down to 6.9; has been as low as 6.3 in the past  Current management, problems identified and blood sugar patterns: He did try to improve his diet which was not good on the last visit in May  He is generally cutting out regular soft drinks, overall eating healthier and generally reducing carbohydrates  He was supposed to be on 1 mg Ozempic but prescription had not been sent for the new dose on the  last visit when he is still had a supply  He thinks he is taking all his medication regularly Renal function was normal with continuing [place] He has started doing a little walking or muscle strengthening exercises With this his weight is down 4 pounds He checks his blood sugars very infrequently and usually not after meals Morning lab glucose was 146   His last consultation with dietitian was in 2016  Blood sugars at home: Fasting range 110-130            Wt Readings from Last 3 Encounters:  04/10/21 202 lb 12.8 oz (92 kg)  01/08/21 206 lb 9.6 oz (93.7 kg)  09/19/20 205 lb 3.2 oz (93.1 kg)     Lab Results  Component Value Date   HGBA1C 6.9 (H) 04/08/2021   HGBA1C 8.0 (H) 01/02/2021   HGBA1C 7.7 (H) 09/17/2020   Lab Results  Component Value Date   MICROALBUR <0.7 06/13/2020   [place] 70 04/08/2021   CREATININE 1.01 04/08/2021   No other active problems: See review of systems   Allergies as of 04/10/2021       Reactions   Molds & Smuts Other (See Comments)        Medication List        Accurate as of April 10, 2021 12:09 PM. If you have any questions, ask your nurse or doctor.          Androderm 4 MG/24HR Pt24 patch Generic drug: testosterone Apply 2 patches on upper chest every 24 hours   atorvastatin 10 MG tablet Commonly known as: LIPITOR Take 1 tablet (10 mg total) by mouth daily. Started by: Elayne Snare, MD   Farxiga 10 MG Tabs tablet Generic drug: dapagliflozin propanediol TAKE 1 TABLET BY MOUTH EVERY DAY   fenofibrate 145 MG tablet Commonly known as: TRICOR TAKE 1 TABLET BY MOUTH EVERY DAY   glucose blood test strip Check glucose bid   metFORMIN 1000 MG tablet Commonly known as: GLUCOPHAGE TAKE 1 TABLET BY MOUTH 2 TIMES DAILY WITH A MEAL.   Ozempic (1 MG/DOSE) 2 MG/1.5ML Sopn Generic drug: Semaglutide (1 MG/DOSE) Inject 1 mg into the skin once a week. Started by: Elayne Snare, MD   valsartan 80 MG tablet Commonly known as: DIOVAN TAKE 1 TABLET BY MOUTH EVERY DAY        Allergies:  Allergies  Allergen Reactions   Molds & Smuts Other (See Comments)    Past Medical History:  Diagnosis Date   Acrophobia    Occasionally requires xanax when he is required to work in high places as part of his occupation.   Adenomatous colon polyp 05/07/06; 05/2012   Repeat colonoscopy 05/17/2012 showed 2 small polyps that were removed--path showed Hyperplastic-not adenomatous.  Still needs repeat  5 yrs.   Angioedema of lips    saw allergist 11/2011   Asthmatic bronchitis , chronic ([place]) 01/11/2012   Question occupational asthma with no specific agents identified. He does notice that some areas of his job are particularly dusty and dirty. Currently he is well controlled using Symbicort through an AeroChamber and rarely needing his rescue inhaler. This would be an acceptable long-term status. Elevated nonspecific total IgE and peripheral eosinophilia to suggest an atopic trigger with sensitization to something Consider Daliresp for future use if needed    Chronic fatigue    +daytime somnolence   Chronic recurrent sinusitis 02/28/2012   Depression    Prozac in the past not much help.  Spontaneously resolved.  Diabetes mellitus without complication ([place])    managed by Dr. Dwyane Dee ([facility])--this is accurate as of 02/2018, when pt last saw Dr. Dwyane Dee.   Diverticulosis    Generalized anxiety disorder 08/11/2012   Hemorrhoids    ext and int   Hyperlipemia, mixed    elev trigs with low HDL. Fish oil OTC.  Statin started in Fall 2018.  Fenofibrate started 07/2018   Hypertension 2017   Hypogonadotropic hypogonadism ([place]) 04/08/2011   As of 2017/18/19, Dr. Dwyane Dee ([facility]) is following: he started clomiph but no better at f/u 11/2016.  Pt denied any improvemt,though testost improved.  11/2017 Clomiphene d/c'd-topical testo started. His hypogonadism is due to insulin resistance syndrome.  As of 08/2017 endo f/u: pt staying on clomiphene (no testost) and is feeling some improved.  No imp 11/2017 f/u on topical testost (off clom)   Insomnia    Internal hemorrhoids, bleeding and sicharge 05/29/2013   OSA on CPAP    setting changed to 6 cm H20 (fixed) 10/2016 by pulm.   Rotator cuff tear 2011   [facility]: conservative mgmt--symptoms stable as of 04/2011.    Past Surgical History:  Procedure Laterality Date   adnoids     CARDIOVASCULAR STRESS TEST     Normal stress nuclear study, EF normal. +Hypertensive bp  response--subsequent 24H ambulatory BP monitoring confirmed HTN.   COLONOSCOPY  05/2006, 05/2012   HEMORRHOID BANDING     POLYPECTOMY     Adenomatous 2007; hyperplastic 2013.  Repeat 5 yrs.   TRANSTHORACIC ECHOCARDIOGRAM  01/30/16   Normal except mild aortic dilatation   WISDOM TOOTH EXTRACTION      Family History  Problem Relation Age of Onset   Hypertension Mother    Diabetes Mother    CVA Mother    Hypertension Father    Diabetes Father    Dementia Father    Heart disease Father    CAD Father     Social History:  reports that he has never smoked. He has never used smokeless tobacco. He reports current alcohol use. He reports that he does not use drugs.  Review of Systems.......  HYPERLIPIDEMIA: Has mixed hyperlipidemia  He has been on fenofibrate and taking regularly Previously on Lipitor 40 mg but this was stopped for unknown reason  Triglycerides still high but relatively better with improving his diet LDL is better   Lab Results  Component Value Date   CHOL 141 04/08/2021   CHOL 160 01/02/2021   CHOL 151 09/17/2020   Lab Results  Component Value Date   HDL 30.70 (L) 04/08/2021   HDL 29.60 (L) 01/02/2021   HDL 27.80 (L) 09/17/2020   Lab Results  Component Value Date   LDLCALC 70 04/08/2021   LDLCALC 87 06/14/2019   Lab Results  Component Value Date   TRIG 198.0 (H) 04/08/2021   TRIG 274.0 (H) 01/02/2021   TRIG 393.0 (H) 09/17/2020   Lab Results  Component Value Date   CHOLHDL 5 04/08/2021   CHOLHDL 5 01/02/2021   CHOLHDL 5 09/17/2020   Lab Results  Component Value Date   LDLDIRECT 101.0 01/02/2021   LDLDIRECT 87.0 09/17/2020   LDLDIRECT 95.0 06/13/2020    HYPERTENSION:     He is on valsartan 80 mg daily  Blood pressure is monitored at home also occasionally   BP Readings from Last 3 Encounters:  04/10/21 138/76  01/08/21 138/84  09/19/20 135/82   Creatinine history:  Lab Results  Component Value Date   CREATININE 1.01  04/08/2021    CREATININE 1.06 01/02/2021   CREATININE 1.06 09/17/2020      General Examination:   BP 138/76   Pulse 77   Ht '5\' 10"'$  (1.778 m)   Wt 202 lb 12.8 oz (92 kg)   SpO2 98%   BMI 29.10 kg/m    Assessment/ Plan:    Hypogonadotropic hypogonadism, long-standing   He has been on Androderm 8 mg daily since 06/2019  He is applying this regularly but  his testosterone level is now very low  This may be from his patch is not sticking consistently and the whole past not being in contact  He only missed 1 dose before his labs Again does not appear symptomatic  Recommended trial of Natesto which will be likely more therapeutic due to more consistent absorption and he will start with this 3 times a day as demonstrated on the device but if he misses a dose at midday that may be okay  HYPERLIPIDEMIA: Has mixed hyperlipidemia with increased triglycerides, and triglycerides are relatively better but not at target For risk reduction he will also start Lipitor 10 mg daily, this may help triglycerides further  DIABETES type 2:  A1c is better at 6.9 compared to 8  He is doing relatively better with consistently improved diet and some exercise May benefit from 1 mg Ozempic and will increase his dose Encouraged him to check blood sugars after meals also  HYPERTENSION: Blood pressure is fairly good on valsartan 80 mg   Needs regular follow-up in about 3 months     There are no Patient Instructions on file for this visit.    Elayne Snare 04/10/2021, 12:09 PM

## 2021-04-15 ENCOUNTER — Telehealth: Payer: Self-pay

## 2021-04-15 DIAGNOSIS — E230 Hypopituitarism: Secondary | ICD-10-CM

## 2021-04-15 MED ORDER — NATESTO 5.5 MG/ACT NA GEL
NASAL | 3 refills | Status: DC
Start: 2021-04-15 — End: 2021-07-08

## 2021-04-15 NOTE — Addendum Note (Signed)
Addended by: Cinda Quest on: 04/15/2021 03:08 PM   Modules accepted: Orders

## 2021-04-15 NOTE — Telephone Encounter (Signed)
Per pharmacy........Marland KitchenProduct is on backorder/unavailable their wholesaler is showing they will not be supplying the product. Spoke with patient and he wanted me to send it in to another pharmacy. Sent to [facility] in [place] per patient.

## 2021-04-21 ENCOUNTER — Other Ambulatory Visit: Payer: Self-pay | Admitting: Endocrinology

## 2021-05-04 ENCOUNTER — Other Ambulatory Visit: Payer: Self-pay | Admitting: Endocrinology

## 2021-05-10 ENCOUNTER — Other Ambulatory Visit: Payer: Self-pay | Admitting: Endocrinology

## 2021-05-12 ENCOUNTER — Telehealth: Payer: Self-pay

## 2021-05-12 DIAGNOSIS — I10 Essential (primary) hypertension: Secondary | ICD-10-CM

## 2021-05-12 MED ORDER — LOSARTAN POTASSIUM 50 MG PO TABS: 50.0000 mg | ORAL_TABLET | Freq: Every day | ORAL | 3 refills | Status: DC

## 2021-05-12 NOTE — Telephone Encounter (Signed)
Rx sent to pharmacy   

## 2021-05-12 NOTE — Telephone Encounter (Signed)
Per pharmacy Alternative Requested:PT PAYING $18 FOR VALSARTAN. PT REQUESTING LOWER-COST ALTERNATIVE: LOSARTAN. $5; Please advise

## 2021-05-20 NOTE — Addendum Note (Signed)
Addended by: Cinda Quest on: 05/20/2021 11:45 AM   Modules accepted: Orders

## 2021-07-03 ENCOUNTER — Other Ambulatory Visit: Payer: Self-pay

## 2021-07-03 ENCOUNTER — Other Ambulatory Visit (INDEPENDENT_AMBULATORY_CARE_PROVIDER_SITE_OTHER): Payer: 59

## 2021-07-03 DIAGNOSIS — E230 Hypopituitarism: Secondary | ICD-10-CM | POA: Diagnosis not present

## 2021-07-03 DIAGNOSIS — E782 Mixed hyperlipidemia: Secondary | ICD-10-CM

## 2021-07-03 DIAGNOSIS — E1165 Type 2 diabetes mellitus with hyperglycemia: Secondary | ICD-10-CM

## 2021-07-03 LAB — LIPID PANEL
Cholesterol: 137 mg/dL (ref 0–200)
HDL: 33.2 mg/dL — ABNORMAL LOW (ref >39.00)
NonHDL: 103.56
Total CHOL/HDL Ratio: 4
Triglycerides: 269 mg/dL — ABNORMAL HIGH (ref 0.0–149.0)
VLDL: 53.8 mg/dL — ABNORMAL HIGH (ref 0.0–40.0)

## 2021-07-03 LAB — CBC
HCT: 48.8 % (ref 39.0–52.0)
Hemoglobin: 16 g/dL (ref 13.0–17.0)
MCHC: 32.7 g/dL (ref 30.0–36.0)
MCV: 84.4 fl (ref 78.0–100.0)
Platelets: 257 10*3/uL (ref 150.0–400.0)
RBC: 5.78 Mil/uL (ref 4.22–5.81)
RDW: 14.3 % (ref 11.5–15.5)
WBC: 8.3 10*3/uL (ref 4.0–10.5)

## 2021-07-03 LAB — COMPREHENSIVE METABOLIC PANEL
ALT: 23 U/L (ref 0–53)
AST: 17 U/L (ref 0–37)
Albumin: 4.7 g/dL (ref 3.5–5.2)
Alkaline Phosphatase: 47 U/L (ref 39–117)
BUN: 24 mg/dL — ABNORMAL HIGH (ref 6–23)
CO2: 25 mEq/L (ref 19–32)
Calcium: 9.8 mg/dL (ref 8.4–10.5)
Chloride: 104 mEq/L (ref 96–112)
Creatinine, Ser: 0.97 mg/dL (ref 0.40–1.50)
GFR: 83.82 mL/min (ref >60.00)
Glucose, Bld: 112 mg/dL — ABNORMAL HIGH (ref 70–99)
Potassium: 4.4 mEq/L (ref 3.5–5.1)
Sodium: 138 mEq/L (ref 135–145)
Total Bilirubin: 0.6 mg/dL (ref 0.2–1.2)
Total Protein: 7.6 g/dL (ref 6.0–8.3)

## 2021-07-03 LAB — HEMOGLOBIN A1C: Hgb A1c MFr Bld: 7.8 % — ABNORMAL HIGH (ref 4.6–6.5)

## 2021-07-03 LAB — TESTOSTERONE: Testosterone: 138.45 ng/dL — ABNORMAL LOW (ref 300.00–890.00)

## 2021-07-03 LAB — LDL CHOLESTEROL, DIRECT: Direct LDL: 76 mg/dL

## 2021-07-08 ENCOUNTER — Encounter: Payer: Self-pay | Admitting: Endocrinology

## 2021-07-08 ENCOUNTER — Ambulatory Visit: Payer: 59 | Admitting: Endocrinology

## 2021-07-08 ENCOUNTER — Other Ambulatory Visit: Payer: Self-pay

## 2021-07-08 VITALS — BP 140/82 | HR 77 | Ht 70.0 in | Wt 200.4 lb

## 2021-07-08 DIAGNOSIS — E782 Mixed hyperlipidemia: Secondary | ICD-10-CM | POA: Diagnosis not present

## 2021-07-08 DIAGNOSIS — E1165 Type 2 diabetes mellitus with hyperglycemia: Secondary | ICD-10-CM

## 2021-07-08 DIAGNOSIS — E230 Hypopituitarism: Secondary | ICD-10-CM

## 2021-07-08 MED ORDER — NATESTO 5.5 MG/ACT NA GEL: NASAL | 3 refills | Status: DC

## 2021-07-08 NOTE — Progress Notes (Signed)
Patient ID: Douglas [name], male   DOB: 03-04-1961, 60 y.o.   MRN: [id number]           Chief complaint: Endocrinology follow-up    History of Present Illness  PROBLEM 1:  Hypogonadism: This was diagnosed in 2009  He was apparently initially seen by his psychiatrist and was having difficulties with fatigue, agoraphobia and decreased libido as well as sexual dysfunction. He was told that his testosterone level was significantly low and was given testosterone supplements probably with AndroGel Subsequently the patient has been treated by various physicians including the urologist with testosterone supplements, mostly AndroGel He thinks that his testosterone levels have never been normal with AndroGel although he thinks that he felt a little better with taking this from his urologist who called him to apply it on his abdomen  He was also tried on monthly injections for about 3 months without consistent benefit also In 2016 he was given [place] again without any benefit   RECENT history: He had baseline complaints of fatigue, decreased motivation, decreased libido and erectile dysfunction from hypogonadism  Initial evaluation showed low free testosterone of 4.5 and normal LH  He had been on clomiphene 25 mg daily after his initial consultation in 09/2015  Because of continued fatigue, decreased libido and testosterone levels being on the low end of normal he was switched to ANDROGEL 3 pumps daily in 11/2017 He was having difficulty applying this in the mornings because of not having time before going to work With this his testosterone levels could be consistently low  He was switched to Androderm, 8 mg daily in October 2020  With the treatment he had improved energy level and less weakness He does get some skin irritation at the site of application and is rotating the sites He ran out of his prescription about a month or so ago He was given a prescription for [facility] but he says  the pharmacy did not fill the prescription even though it was sent by the fax on 04/15/2021 Despite not taking any medication he does not think he feels unusually tired or weak although recently when trying to be more active he felt more fatigued for the first couple of days  Testosterone is low as expected    Hemoglobin is unchanged   His lab results as follows:  Lab Results  Component Value Date   TESTOSTERONE 138.45 (L) 07/03/2021   TESTOSTERONE 65.36 Repeated and verified X2. (L) 04/08/2021   TESTOSTERONE 381.53 01/02/2021   TESTOSTERONE 119.03 (L) 09/17/2020     Lab Results  Component Value Date   LH 4.30 11/07/2018   Lab Results  Component Value Date   HGB 16.0 07/03/2021    DIABETES type II: He had significant hyperglycemia at onset with A1c about 13  Since he was not benefiting from Actos and metformin given by his PCP he was given Invokana in addition in August 2018 Since 07/2017 he has been on Ozempic  Non-insulin HYPOGLYCEMIC regimen: Farxiga 10 mg daily, metformin 1000 mg twice daily and Ozempic 1mg  weekly  His A1c has been fluctuating, now 7.8 and higher; has been as low as 6.3 in the past  Current management, problems identified and blood sugar patterns: He did not have a prescription for his Ozempic for some time for various reasons and only started about a week or so ago  Although his weight is slightly better he is likely not having as good control from running out of his medications  He is  trying to do regular exercise at the gym more outside work He thinks his diet is fairly good and he is only having a regular soft and every 3 days or so Avoiding other drinks with sugar including tea He checks his blood sugars very infrequently and none for the last month, had run out of strips Morning lab glucose was 112   His last consultation with dietitian was in 2016  Blood sugars at home: None recently           Wt Readings from Last 3 Encounters:   07/08/21 200 lb 6.4 oz (90.9 kg)  04/10/21 202 lb 12.8 oz (92 kg)  01/08/21 206 lb 9.6 oz (93.7 kg)     Lab Results  Component Value Date   HGBA1C 7.8 (H) 07/03/2021   HGBA1C 6.9 (H) 04/08/2021   HGBA1C 8.0 (H) 01/02/2021   Lab Results  Component Value Date   MICROALBUR <0.7 06/13/2020   [place] 70 04/08/2021   CREATININE 0.97 07/03/2021   No other active problems: See review of systems   Allergies as of 07/08/2021       Reactions   Molds & Smuts Other (See Comments)        Medication List        Accurate as of July 08, 2021 12:14 PM. If you have any questions, ask your nurse or doctor.          atorvastatin 10 MG tablet Commonly known as: LIPITOR Take 1 tablet (10 mg total) by mouth daily.   Farxiga 10 MG Tabs tablet Generic drug: dapagliflozin propanediol TAKE 1 TABLET BY MOUTH EVERY DAY   fenofibrate 145 MG tablet Commonly known as: TRICOR TAKE 1 TABLET BY MOUTH EVERY DAY   glucose blood test strip Check glucose bid   losartan 50 MG tablet Commonly known as: COZAAR Take 1 tablet (50 mg total) by mouth daily.   metFORMIN 1000 MG tablet Commonly known as: GLUCOPHAGE TAKE 1 TABLET BY MOUTH 2 TIMES DAILY WITH A MEAL.   Natesto 5.5 MG/ACT Gel Generic drug: Testosterone Squirt 1 pump into each nostril 3 times a day   Ozempic (1 MG/DOSE) 2 MG/1.5ML Sopn Generic drug: Semaglutide (1 MG/DOSE) Inject 1 mg into the skin once a week.        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   Adenomatous colon polyp 05/07/06; 05/2012   Repeat colonoscopy 05/17/2012 showed 2 small polyps that were removed--path showed Hyperplastic-not adenomatous.  Still needs repeat 5 yrs.   Angioedema of lips    saw allergist 11/2011   Asthmatic bronchitis , chronic ([place])  01/11/2012   Question occupational asthma with no specific agents identified. He does notice that some areas of his job are particularly dusty and dirty. Currently he is well controlled using Symbicort through an AeroChamber and rarely needing his rescue inhaler. This would be an acceptable long-term status. Elevated nonspecific total IgE and peripheral eosinophilia to suggest an atopic trigger with sensitization to something Consider Daliresp for future use if needed    Chronic fatigue    +daytime somnolence   Chronic recurrent sinusitis 02/28/2012   Depression    Prozac in the past not much help.  Spontaneously resolved.   Diabetes mellitus without complication ([place])  managed by Dr. Dwyane Dee ([facility])--this is accurate as of 02/2018, when pt last saw Dr. Dwyane Dee.   Diverticulosis    Generalized anxiety disorder 08/11/2012   Hemorrhoids    ext and int   Hyperlipemia, mixed    elev trigs with low HDL. Fish oil OTC.  Statin started in Fall 2018.  Fenofibrate started 07/2018   Hypertension 2017   Hypogonadotropic hypogonadism ([place]) 04/08/2011   As of 2017/18/19, Dr. Dwyane Dee ([facility]) is following: he started clomiph but no better at f/u 11/2016.  Pt denied any improvemt,though testost improved.  11/2017 Clomiphene d/c'd-topical testo started. His hypogonadism is due to insulin resistance syndrome.  As of 08/2017 endo f/u: pt staying on clomiphene (no testost) and is feeling some improved.  No imp 11/2017 f/u on topical testost (off clom)   Insomnia    Internal hemorrhoids, bleeding and sicharge 05/29/2013   OSA on CPAP    setting changed to 6 cm H20 (fixed) 10/2016 by pulm.   Rotator cuff tear 2011   [facility]: conservative mgmt--symptoms stable as of 04/2011.    Past Surgical History:  Procedure Laterality Date   adnoids     CARDIOVASCULAR STRESS TEST     Normal stress nuclear study, EF normal. +Hypertensive bp response--subsequent 24H ambulatory BP monitoring confirmed HTN.   COLONOSCOPY  05/2006,  05/2012   HEMORRHOID BANDING     POLYPECTOMY     Adenomatous 2007; hyperplastic 2013.  Repeat 5 yrs.   TRANSTHORACIC ECHOCARDIOGRAM  01/30/16   Normal except mild aortic dilatation   WISDOM TOOTH EXTRACTION      Family History  Problem Relation Age of Onset   Hypertension Mother    Diabetes Mother    CVA Mother    Hypertension Father    Diabetes Father    Dementia Father    Heart disease Father    CAD Father     Social History:  reports that he has never smoked. He has never used smokeless tobacco. He reports current alcohol use. He reports that he does not use drugs.  Review of Systems  HYPERLIPIDEMIA: Has mixed hyperlipidemia  He has been on fenofibrate and taking regularly Previously on Lipitor 40 mg and now back on 10 mg in addition to his fenofibrate  Triglycerides still high but thinks this may have been from eating out on a trip LDL is better   Lab Results  Component Value Date   CHOL 137 07/03/2021   CHOL 141 04/08/2021   CHOL 160 01/02/2021   Lab Results  Component Value Date   HDL 33.20 (L) 07/03/2021   HDL 30.70 (L) 04/08/2021   HDL 29.60 (L) 01/02/2021   Lab Results  Component Value Date   LDLCALC 70 04/08/2021   [place] 87 06/14/2019   Lab Results  Component Value Date   TRIG 269.0 (H) 07/03/2021   TRIG 198.0 (H) 04/08/2021   TRIG 274.0 (H) 01/02/2021   Lab Results  Component Value Date   CHOLHDL 4 07/03/2021   CHOLHDL 5 04/08/2021   CHOLHDL 5 01/02/2021   Lab Results  Component Value Date   LDLDIRECT 76.0 07/03/2021   LDLDIRECT 101.0 01/02/2021   LDLDIRECT 87.0 09/17/2020    HYPERTENSION:     He is on losartan, blood pressure was high on the first measurement today but he thinks this is not unusual  Blood pressure is monitored at home: Recent systolic about 381    BP Readings from Last 3 Encounters:  07/08/21 140/82  04/10/21 138/76  01/08/21 138/84   Creatinine history:  Lab Results  Component Value Date   CREATININE  0.97 07/03/2021   CREATININE 1.01 04/08/2021   CREATININE 1.06 01/02/2021      General Examination:   BP 140/82   Pulse 77   Ht 5\' 10"  (1.778 m)   Wt 200 lb 6.4 oz (90.9 kg)   SpO2 98%   BMI 28.75 kg/m   Diabetic Foot Exam - Simple   Simple Foot Form Diabetic Foot exam was performed with the following findings: Yes   Visual Inspection No deformities, no ulcerations, no other skin breakdown bilaterally: Yes See comments: Yes Sensation Testing Intact to touch and monofilament testing bilaterally: Yes Pulse Check Posterior Tibialis and Dorsalis pulse intact bilaterally: Yes Comments Some calluses on the first toes medially No ankle edema     Assessment/ Plan:    Hypogonadotropic hypogonadism, long-standing   He has been on Androderm 8 mg daily since 06/2019  He is supposed to be switching to [place] because of difficulties using the patch previously He did not receive the prescription at the pharmacy and he did not call to let us know about this  Again does not appear symptomatic despite very low testosterone levels New prescription sent, reviewed directions for using this    HYPERLIPIDEMIA: Has mixed hyperlipidemia with increased triglycerides may be related to recently having higher blood sugars and going out of town and eating out more His LDL is better we will continue Lipitor that was added  DIABETES type 2:  A1c is higher as discussed above  He is likely having high readings because of either going off his diet or being irregular with his Ozempic Not monitoring at home lately He does need to check his blood sugars consistently and watch his postprandial readings since lab fasting reading was fairly good Continue 1 mg Ozempic weekly along with [place] and metformin  HYPERTENSION: Blood pressure is high normal but he will monitor more at home and let us know if blood pressure is higher consistently       Patient Instructions  Check blood sugars on  waking up 2-3 days a week  Also check blood sugars about 2 hours after meals and do this after different meals by rotation  Recommended blood sugar levels on waking up are 90-130 and about 2 hours after meal is 130-160  Please bring your blood sugar monitor to each visit, thank you    Elayne Snare 07/08/2021, 12:14 PM

## 2021-07-08 NOTE — Patient Instructions (Signed)
Check blood sugars on waking up 2-3 days a week  Also check blood sugars about 2 hours after meals and do this after different meals by rotation  Recommended blood sugar levels on waking up are 90-130 and about 2 hours after meal is 130-160  Please bring your blood sugar monitor to each visit, thank you   

## 2021-07-23 ENCOUNTER — Other Ambulatory Visit: Payer: Self-pay | Admitting: Endocrinology

## 2021-08-09 ENCOUNTER — Other Ambulatory Visit: Payer: Self-pay | Admitting: Endocrinology

## 2021-08-09 DIAGNOSIS — I10 Essential (primary) hypertension: Secondary | ICD-10-CM

## 2021-09-05 ENCOUNTER — Other Ambulatory Visit: Payer: Self-pay | Admitting: Endocrinology

## 2021-09-14 ENCOUNTER — Other Ambulatory Visit: Payer: Self-pay | Admitting: Endocrinology

## 2021-09-18 ENCOUNTER — Other Ambulatory Visit: Payer: Self-pay | Admitting: Endocrinology

## 2021-10-02 ENCOUNTER — Other Ambulatory Visit (INDEPENDENT_AMBULATORY_CARE_PROVIDER_SITE_OTHER): Payer: 59

## 2021-10-02 ENCOUNTER — Other Ambulatory Visit: Payer: Self-pay

## 2021-10-02 DIAGNOSIS — E230 Hypopituitarism: Secondary | ICD-10-CM | POA: Diagnosis not present

## 2021-10-02 DIAGNOSIS — E1165 Type 2 diabetes mellitus with hyperglycemia: Secondary | ICD-10-CM | POA: Diagnosis not present

## 2021-10-02 DIAGNOSIS — E782 Mixed hyperlipidemia: Secondary | ICD-10-CM

## 2021-10-02 LAB — MICROALBUMIN / CREATININE URINE RATIO
Creatinine,U: 94.2 mg/dL
Microalb Creat Ratio: 1.6 mg/g (ref 0.0–30.0)
Microalb, Ur: 1.5 mg/dL (ref 0.0–1.9)

## 2021-10-02 LAB — LIPID PANEL
Cholesterol: 131 mg/dL (ref 0–200)
HDL: 32.9 mg/dL — ABNORMAL LOW (ref >39.00)
LDL Cholesterol: 60 mg/dL (ref 0–99)
NonHDL: 97.8
Total CHOL/HDL Ratio: 4
Triglycerides: 188 mg/dL — ABNORMAL HIGH (ref 0.0–149.0)
VLDL: 37.6 mg/dL (ref 0.0–40.0)

## 2021-10-02 LAB — COMPREHENSIVE METABOLIC PANEL
ALT: 22 U/L (ref 0–53)
AST: 17 U/L (ref 0–37)
Albumin: 4.7 g/dL (ref 3.5–5.2)
Alkaline Phosphatase: 46 U/L (ref 39–117)
BUN: 18 mg/dL (ref 6–23)
CO2: 27 mEq/L (ref 19–32)
Calcium: 10 mg/dL (ref 8.4–10.5)
Chloride: 105 mEq/L (ref 96–112)
Creatinine, Ser: 1.04 mg/dL (ref 0.40–1.50)
GFR: 76.96 mL/min (ref >60.00)
Glucose, Bld: 95 mg/dL (ref 70–99)
Potassium: 4.5 mEq/L (ref 3.5–5.1)
Sodium: 142 mEq/L (ref 135–145)
Total Bilirubin: 0.6 mg/dL (ref 0.2–1.2)
Total Protein: 7.4 g/dL (ref 6.0–8.3)

## 2021-10-02 LAB — TESTOSTERONE: Testosterone: 217.43 ng/dL — ABNORMAL LOW (ref 300.00–890.00)

## 2021-10-02 LAB — HEMOGLOBIN A1C: Hgb A1c MFr Bld: 7.3 % — ABNORMAL HIGH (ref 4.6–6.5)

## 2021-10-08 ENCOUNTER — Ambulatory Visit: Payer: 59 | Admitting: Endocrinology

## 2021-10-08 ENCOUNTER — Encounter: Payer: Self-pay | Admitting: Endocrinology

## 2021-10-08 ENCOUNTER — Other Ambulatory Visit: Payer: Self-pay

## 2021-10-08 VITALS — BP 130/82 | HR 78 | Ht 70.0 in | Wt 198.0 lb

## 2021-10-08 DIAGNOSIS — E230 Hypopituitarism: Secondary | ICD-10-CM

## 2021-10-08 DIAGNOSIS — E1165 Type 2 diabetes mellitus with hyperglycemia: Secondary | ICD-10-CM

## 2021-10-08 DIAGNOSIS — E782 Mixed hyperlipidemia: Secondary | ICD-10-CM | POA: Diagnosis not present

## 2021-10-08 LAB — POCT GLUCOSE (DEVICE FOR HOME USE): POC Glucose: 135 mg/dl — AB (ref 70–99)

## 2021-10-08 MED ORDER — JATENZO 158 MG PO CAPS: 1.0000 | ORAL_CAPSULE | Freq: Two times a day (BID) | ORAL | 2 refills | Status: DC

## 2021-10-08 NOTE — Progress Notes (Signed)
Patient ID: Douglas [name], male   DOB: [birth year], 61 y.o.   MRN: [id number]           Chief complaint: Endocrinology follow-up    History of Present Illness  PROBLEM 1:  Hypogonadism: This was diagnosed in 2009  He was apparently initially seen by his psychiatrist and was having difficulties with fatigue, agoraphobia and decreased libido as well as sexual dysfunction. He was told that his testosterone level was significantly low and was given testosterone supplements probably with AndroGel Subsequently the patient has been treated by various physicians including the urologist with testosterone supplements, mostly AndroGel He thinks that his testosterone levels have never been normal with AndroGel although he thinks that he felt a little better with taking this from his urologist who called him to apply it on his abdomen  He was also tried on monthly injections for about 3 months without consistent benefit also In 2016 he was given [place] again without any benefit   RECENT history: He had baseline complaints of fatigue, decreased motivation, decreased libido and erectile dysfunction from hypogonadism  Initial evaluation showed low free testosterone of 4.5 and normal LH  He had been on clomiphene 25 mg daily after his initial consultation in 09/2015  Because of continued fatigue, decreased libido and testosterone levels being on the low end of normal he was switched to ANDROGEL 3 pumps daily in 11/2017 He was having difficulty applying this in the mornings because of not having time before going to work With this his testosterone levels could be consistently low  He was switched to Androderm, 8 mg daily in October 2020 With the treatment he had improved energy level and less weakness He does get skin irritation with this at the site of application and this was discontinued  He was given a prescription for Natesto but he tends to have nasal congestion and blockage with this and  has not been able to use this consistently He does not think he has felt excessively fatigued lately However his testosterone level is somewhat higher than the last 2   Hemoglobin is unchanged   His lab results as follows:  Lab Results  Component Value Date   TESTOSTERONE 217.43 (L) 10/02/2021   TESTOSTERONE 138.45 (L) 07/03/2021   TESTOSTERONE 65.36 Repeated and verified X2. (L) 04/08/2021   TESTOSTERONE 381.53 01/02/2021     Lab Results  Component Value Date   LH 4.30 11/07/2018   Lab Results  Component Value Date   HGB 16.0 07/03/2021    DIABETES type II: He had significant hyperglycemia at onset with A1c about 13  Since he was not benefiting from Actos and metformin given by his PCP he was given Invokana in addition in August 2018 Since 07/2017 he has been on Ozempic  Non-insulin HYPOGLYCEMIC regimen: Farxiga 10 mg daily, metformin 1000 mg twice daily and Ozempic 1mg  weekly  His A1c has been fluctuating, now 7.3; has been as low as 6.3 in the past  Current management, problems identified and blood sugar patterns: He did not bring his monitor for download and does not think he is checking regularly recently  He was apparently doing better previously with a diet by mail  However only recently in the last month or so he has started eating a mostly vegetarian diet and he thinks his blood sugars may be better  However with eating rice or other carbohydrates periodically his blood sugars may be up to 200  This is despite going up to 1  mg Ozempic which he has taken regularly without any nausea, he had not been regular with this prior to his last visit in November  He has not done much exercise, previously was going to the gym  His fasting glucose was 95 with the lab   His last consultation with dietitian was in 2016  Blood sugars at home: Checked occasionally recently  Usually fasting readings below 120, PC up to 135 but occasionally 200        Wt Readings from Last 3  Encounters:  10/08/21 198 lb (89.8 kg)  07/08/21 200 lb 6.4 oz (90.9 kg)  04/10/21 202 lb 12.8 oz (92 kg)     Lab Results  Component Value Date   HGBA1C 7.3 (H) 10/02/2021   HGBA1C 7.8 (H) 07/03/2021   HGBA1C 6.9 (H) 04/08/2021   Lab Results  Component Value Date   MICROALBUR 1.5 10/02/2021   LDLCALC 60 10/02/2021   CREATININE 1.04 10/02/2021   No other active problems: See review of systems   Allergies as of 10/08/2021       Reactions   Molds & Smuts Other (See Comments)        Medication List        Accurate as of October 08, 2021  4:17 PM. If you have any questions, ask your nurse or doctor.          atorvastatin 10 MG tablet Commonly known as: LIPITOR Take 1 tablet (10 mg total) by mouth daily.   Farxiga 10 MG Tabs tablet Generic drug: dapagliflozin propanediol TAKE 1 TABLET BY MOUTH EVERY DAY   fenofibrate 145 MG tablet Commonly known as: TRICOR TAKE 1 TABLET BY MOUTH EVERY DAY   glucose blood test strip Check glucose bid   losartan 50 MG tablet Commonly known as: COZAAR TAKE 1 TABLET BY MOUTH EVERY DAY   metFORMIN 1000 MG tablet Commonly known as: GLUCOPHAGE TAKE 1 TABLET BY MOUTH 2 TIMES DAILY WITH A MEAL.   Natesto 5.5 MG/ACT Gel Generic drug: Testosterone Squirt 1 pump into each nostril 3 times a day   Ozempic (1 MG/DOSE) 4 MG/3ML Sopn Generic drug: Semaglutide (1 MG/DOSE) INJECT 1MG  INTO THE SKIN ONCE A WEEK        Allergies:  Allergies  Allergen Reactions   Molds & Smuts Other (See Comments)    Past Medical History:  Diagnosis Date   Acrophobia    Occasionally requires xanax when he is required to work in high places as part of his occupation.   Adenomatous colon polyp 05/07/06; 05/2012   Repeat colonoscopy 05/17/2012 showed 2 small polyps that were removed--path showed Hyperplastic-not adenomatous.  Still needs repeat 5 yrs.   Angioedema of lips    saw allergist 11/2011   Asthmatic bronchitis , chronic ([place]) 01/11/2012    Question occupational asthma with no specific agents identified. He does notice that some areas of his job are particularly dusty and dirty. Currently he is well controlled using Symbicort through an AeroChamber and rarely needing his rescue inhaler. This would be an acceptable long-term status. Elevated nonspecific total IgE and peripheral eosinophilia to suggest an atopic trigger with sensitization to something Consider Daliresp for future use if needed    Chronic fatigue    +daytime somnolence   Chronic recurrent sinusitis 02/28/2012   Depression    Prozac in the past not much help.  Spontaneously resolved.   Diabetes mellitus without complication ([place])    managed by Dr. Dwyane Dee ([facility])--this is accurate as of  02/2018, when pt last saw Dr. Dwyane Dee.   Diverticulosis    Generalized anxiety disorder 08/11/2012   Hemorrhoids    ext and int   Hyperlipemia, mixed    elev trigs with low HDL. Fish oil OTC.  Statin started in Fall 2018.  Fenofibrate started 07/2018   Hypertension 2017   Hypogonadotropic hypogonadism ([place]) 04/08/2011   As of 2017/18/19, Dr. Dwyane Dee ([facility]) is following: he started clomiph but no better at f/u 11/2016.  Pt denied any improvemt,though testost improved.  11/2017 Clomiphene d/c'd-topical testo started. His hypogonadism is due to insulin resistance syndrome.  As of 08/2017 endo f/u: pt staying on clomiphene (no testost) and is feeling some improved.  No imp 11/2017 f/u on topical testost (off clom)   Insomnia    Internal hemorrhoids, bleeding and sicharge 05/29/2013   OSA on CPAP    setting changed to 6 cm H20 (fixed) 10/2016 by pulm.   Rotator cuff tear 2011   [facility]: conservative mgmt--symptoms stable as of 04/2011.    Past Surgical History:  Procedure Laterality Date   adnoids     CARDIOVASCULAR STRESS TEST     Normal stress nuclear study, EF normal. +Hypertensive bp response--subsequent 24H ambulatory BP monitoring confirmed HTN.   COLONOSCOPY  05/2006, 05/2012    HEMORRHOID BANDING     POLYPECTOMY     Adenomatous 2007; hyperplastic 2013.  Repeat 5 yrs.   TRANSTHORACIC ECHOCARDIOGRAM  01/30/16   Normal except mild aortic dilatation   WISDOM TOOTH EXTRACTION      Family History  Problem Relation Age of Onset   Hypertension Mother    Diabetes Mother    CVA Mother    Hypertension Father    Diabetes Father    Dementia Father    Heart disease Father    CAD Father     Social History:  reports that he has never smoked. He has never used smokeless tobacco. He reports current alcohol use. He reports that he does not use drugs.  Review of Systems  HYPERLIPIDEMIA: Has mixed hyperlipidemia  He has been on fenofibrate and taking regularly Previously on Lipitor 40 mg and now back on 10 mg in addition to his fenofibrate  Triglycerides improved, he has been eating less meat and more vegetables now  LDL is better   Lab Results  Component Value Date   CHOL 131 10/02/2021   CHOL 137 07/03/2021   CHOL 141 04/08/2021   Lab Results  Component Value Date   HDL 32.90 (L) 10/02/2021   HDL 33.20 (L) 07/03/2021   HDL 30.70 (L) 04/08/2021   Lab Results  Component Value Date   LDLCALC 60 10/02/2021   LDLCALC 70 04/08/2021   LDLCALC 87 06/14/2019   Lab Results  Component Value Date   TRIG 188.0 (H) 10/02/2021   TRIG 269.0 (H) 07/03/2021   TRIG 198.0 (H) 04/08/2021   Lab Results  Component Value Date   CHOLHDL 4 10/02/2021   CHOLHDL 4 07/03/2021   CHOLHDL 5 04/08/2021   Lab Results  Component Value Date   LDLDIRECT 76.0 07/03/2021   LDLDIRECT 101.0 01/02/2021   LDLDIRECT 87.0 09/17/2020    HYPERTENSION:     He is on losartan 50 mg daily  Blood pressure is monitored at home:   BP Readings from Last 3 Encounters:  10/08/21 130/82  07/08/21 140/82  04/10/21 138/76   Creatinine history:  Lab Results  Component Value Date   CREATININE 1.04 10/02/2021   CREATININE 0.97 07/03/2021  CREATININE 1.01 04/08/2021      General  Examination:   BP 130/82 (BP Location: Left Arm, Patient Position: Sitting, Cuff Size: Normal)    Pulse 78    Ht 5\' 10"  (1.778 m)    Wt 198 lb (89.8 kg)    SpO2 95%    BMI 28.41 kg/m    Assessment/ Plan:    Hypogonadotropic hypogonadism, long-standing   He has been on  Natesto but has not been able to use it consistently because of difficulties with nasal blockage and irritation but he did not let us know he had difficulty using this consistently Although he is not very symptomatic again his testosterone level is better than usual, has been as low as 65  Since he has not been able to get absorption of any transdermal testosterone preparations we will now try Jatenzo tablets  New prescription sent, reviewed directions for taking this, patient information brochure and co-pay card given Will start with 158 mg twice daily and adjust accordingly   HYPERLIPIDEMIA: Has mixed hyperlipidemia with recently better triglycerides from improved diet  DIABETES type 2:  A1c is slightly better at 7.3 as discussed above  He is not always consistent with diet Also can start exercise Discussed adding protein to every meal Continue 1 mg Ozempic weekly along with [place] and metformin  HYPERTENSION: Blood pressure is better today       Patient Instructions  Exercise daily      Elayne Snare 10/08/2021, 4:18 PM

## 2021-10-08 NOTE — Patient Instructions (Addendum)
Exercise daily 

## 2021-10-24 ENCOUNTER — Other Ambulatory Visit: Payer: Self-pay | Admitting: Endocrinology

## 2021-11-01 ENCOUNTER — Other Ambulatory Visit: Payer: Self-pay | Admitting: Endocrinology

## 2021-12-03 ENCOUNTER — Other Ambulatory Visit: Payer: Self-pay | Admitting: Endocrinology

## 2021-12-03 DIAGNOSIS — I10 Essential (primary) hypertension: Secondary | ICD-10-CM

## 2022-01-01 ENCOUNTER — Other Ambulatory Visit (INDEPENDENT_AMBULATORY_CARE_PROVIDER_SITE_OTHER): Payer: 59

## 2022-01-01 DIAGNOSIS — E1165 Type 2 diabetes mellitus with hyperglycemia: Secondary | ICD-10-CM

## 2022-01-01 DIAGNOSIS — E230 Hypopituitarism: Secondary | ICD-10-CM

## 2022-01-01 LAB — BASIC METABOLIC PANEL
BUN: 18 mg/dL (ref 6–23)
CO2: 25 mEq/L (ref 19–32)
Calcium: 9.5 mg/dL (ref 8.4–10.5)
Chloride: 105 mEq/L (ref 96–112)
Creatinine, Ser: 1.13 mg/dL (ref 0.40–1.50)
GFR: 69.55 mL/min (ref >60.00)
Glucose, Bld: 116 mg/dL — ABNORMAL HIGH (ref 70–99)
Potassium: 4.2 mEq/L (ref 3.5–5.1)
Sodium: 139 mEq/L (ref 135–145)

## 2022-01-01 LAB — CBC
HCT: 46.3 % (ref 39.0–52.0)
Hemoglobin: 15.1 g/dL (ref 13.0–17.0)
MCHC: 32.6 g/dL (ref 30.0–36.0)
MCV: 85.6 fl (ref 78.0–100.0)
Platelets: 270 10*3/uL (ref 150.0–400.0)
RBC: 5.41 Mil/uL (ref 4.22–5.81)
RDW: 13.9 % (ref 11.5–15.5)
WBC: 6.8 10*3/uL (ref 4.0–10.5)

## 2022-01-01 LAB — TESTOSTERONE: Testosterone: 397 ng/dL (ref 300.00–890.00)

## 2022-01-01 LAB — HEMOGLOBIN A1C: Hgb A1c MFr Bld: 7 % — ABNORMAL HIGH (ref 4.6–6.5)

## 2022-01-07 ENCOUNTER — Ambulatory Visit: Payer: 59 | Admitting: Endocrinology

## 2022-01-11 ENCOUNTER — Other Ambulatory Visit: Payer: Self-pay | Admitting: Endocrinology

## 2022-01-15 ENCOUNTER — Other Ambulatory Visit: Payer: Self-pay | Admitting: Endocrinology

## 2022-01-16 ENCOUNTER — Telehealth: Payer: Self-pay

## 2022-01-16 NOTE — Telephone Encounter (Signed)
Patient needs refill on [employer]

## 2022-01-22 ENCOUNTER — Ambulatory Visit: Payer: 59 | Admitting: Endocrinology

## 2022-01-22 ENCOUNTER — Encounter: Payer: Self-pay | Admitting: Endocrinology

## 2022-01-22 VITALS — BP 122/72 | HR 83 | Ht 70.0 in | Wt 202.6 lb

## 2022-01-22 DIAGNOSIS — E230 Hypopituitarism: Secondary | ICD-10-CM

## 2022-01-22 DIAGNOSIS — I10 Essential (primary) hypertension: Secondary | ICD-10-CM

## 2022-01-22 DIAGNOSIS — E1165 Type 2 diabetes mellitus with hyperglycemia: Secondary | ICD-10-CM | POA: Diagnosis not present

## 2022-01-22 NOTE — Patient Instructions (Signed)
Check blood sugars on waking up 2-3 days a week  Also check blood sugars about 2 hours after meals and do this after different meals by rotation  Recommended blood sugar levels on waking up are 90-130 and about 2 hours after meal is 130-160  Please bring your blood sugar monitor to each visit, thank you  Continue exercise

## 2022-01-22 NOTE — Progress Notes (Signed)
Patient ID: Douglas [name], male   DOB: [birth year], 61 y.o.   MRN: [id number]           Chief complaint: Endocrinology follow-up    History of Present Illness  PROBLEM 1:  Hypogonadism: This was diagnosed in 2009  He was apparently initially seen by his psychiatrist and was having difficulties with fatigue, agoraphobia and decreased libido as well as sexual dysfunction. He was told that his testosterone level was significantly low and was given testosterone supplements probably with AndroGel Subsequently the patient has been treated by various physicians including the urologist with testosterone supplements, mostly AndroGel He thinks that his testosterone levels have never been normal with AndroGel although he thinks that he felt a little better with taking this from his urologist who called him to apply it on his abdomen  He was also tried on monthly injections for about 3 months without consistent benefit also In 2016 he was given [place] again without any benefit   RECENT history: He had baseline complaints of fatigue, decreased motivation, decreased libido and erectile dysfunction from hypogonadism  Initial evaluation showed low free testosterone of 4.5 and normal LH  He had been on clomiphene 25 mg daily after his initial consultation in 09/2015  Because of continued fatigue, decreased libido and testosterone levels being on the low end of normal he was switched to ANDROGEL 3 pumps daily in 11/2017 He was having difficulty applying this in the mornings because of not having time before going to work With this his testosterone levels could be consistently low  He was switched to Androderm, 8 mg daily in October 2020 With the treatment he had improved energy level and less weakness; however because of skin irritation this was discontinued  He was given a prescription for Natesto but he tends to have nasal congestion and blockage with this and this was stopped   He is  taking JATENZO after prior authorization He is taking it regularly after breakfast and dinner without side effects  Although his labs were drawn in the morning his testosterone better is much better than usual His hemoglobin is still normal He does feel better with his energy level   His lab results as follows:  Lab Results  Component Value Date   TESTOSTERONE 397.00 01/01/2022   TESTOSTERONE 217.43 (L) 10/02/2021   TESTOSTERONE 138.45 (L) 07/03/2021   TESTOSTERONE 65.36 Repeated and verified X2. (L) 04/08/2021     Lab Results  Component Value Date   LH 4.30 11/07/2018   Lab Results  Component Value Date   HGB 15.1 01/01/2022    DIABETES type II: He had significant hyperglycemia at onset with A1c about 13  Since he was not benefiting from Actos and metformin given by his PCP he was given Invokana in addition in August 2018 Since 07/2017 he has been on Ozempic  Non-insulin HYPOGLYCEMIC regimen: Farxiga 10 mg daily, metformin 1000 mg twice daily and Ozempic '1mg'$  weekly  His A1c has been fluctuating, now 7 and has been gradually improving  Current management, problems identified and blood sugar patterns: He did not bring his monitor for download and not checking recently Not clear what his home blood sugars are after meals but his fasting glucose was 116  He is doing a little bit more walking but mostly with work Weight is only about 4 pounds higher  No side effects from any medication He has been able to get Ozempic regularly   His last consultation with dietitian was in 2016  Blood sugars at home: Checked occasionally recently       Wt Readings from Last 3 Encounters:  01/22/22 202 lb 9.6 oz (91.9 kg)  10/08/21 198 lb (89.8 kg)  07/08/21 200 lb 6.4 oz (90.9 kg)     Lab Results  Component Value Date   HGBA1C 7.0 (H) 01/01/2022   HGBA1C 7.3 (H) 10/02/2021   HGBA1C 7.8 (H) 07/03/2021   Lab Results  Component Value Date   MICROALBUR 1.5 10/02/2021   LDLCALC  60 10/02/2021   CREATININE 1.13 01/01/2022   No other active problems: See review of systems   Allergies as of 01/22/2022       Reactions   Molds & Smuts Other (See Comments)        Medication List        Accurate as of Jan 22, 2022  2:48 PM. If you have any questions, ask your nurse or doctor.          atorvastatin 10 MG tablet Commonly known as: LIPITOR Take 1 tablet (10 mg total) by mouth daily.   Farxiga 10 MG Tabs tablet Generic drug: dapagliflozin propanediol TAKE 1 TABLET BY MOUTH EVERY DAY   fenofibrate 145 MG tablet Commonly known as: TRICOR TAKE 1 TABLET BY MOUTH EVERY DAY   glucose blood test strip Check glucose bid   Jatenzo 158 MG Caps Generic drug: Testosterone Undecanoate TAKE 1 CAPSULE BY MOUTH 2 (TWO) TIMES DAILY AFTER A MEAL.   losartan 50 MG tablet Commonly known as: COZAAR TAKE 1 TABLET BY MOUTH EVERY DAY   metFORMIN 1000 MG tablet Commonly known as: GLUCOPHAGE TAKE 1 TABLET BY MOUTH 2 TIMES DAILY WITH A MEAL.   Ozempic (1 MG/DOSE) 4 MG/3ML Sopn Generic drug: Semaglutide (1 MG/DOSE) INJECT '1MG'$  INTO THE SKIN ONCE A WEEK        Allergies:  Allergies  Allergen Reactions   Molds & Smuts Other (See Comments)    Past Medical History:  Diagnosis Date   Acrophobia    Occasionally requires xanax when he is required to work in high places as part of his occupation.   Adenomatous colon polyp 05/07/06; 05/2012   Repeat colonoscopy 05/17/2012 showed 2 small polyps that were removed--path showed Hyperplastic-not adenomatous.  Still needs repeat 5 yrs.   Angioedema of lips    saw allergist 11/2011   Asthmatic bronchitis , chronic ([place]) 01/11/2012   Question occupational asthma with no specific agents identified. He does notice that some areas of his job are particularly dusty and dirty. Currently he is well controlled using Symbicort through an AeroChamber and rarely needing his rescue inhaler. This would be an acceptable long-term status.  Elevated nonspecific total IgE and peripheral eosinophilia to suggest an atopic trigger with sensitization to something Consider Daliresp for future use if needed    Chronic fatigue    +daytime somnolence   Chronic recurrent sinusitis 02/28/2012   Depression    Prozac in the past not much help.  Spontaneously resolved.   Diabetes mellitus without complication ([place])    managed by Dr. Dwyane Dee ([facility])--this is accurate as of 02/2018, when pt last saw Dr. Dwyane Dee.   Diverticulosis    Generalized anxiety disorder 08/11/2012   Hemorrhoids    ext and int   Hyperlipemia, mixed    elev trigs with low HDL. Fish oil OTC.  Statin started in Fall 2018.  Fenofibrate started 07/2018   Hypertension 2017   Hypogonadotropic hypogonadism ([place]) 04/08/2011   As of 2017/18/19, Dr. Dwyane Dee (  [facility]) is following: he started clomiph but no better at f/u 11/2016.  Pt denied any improvemt,though testost improved.  11/2017 Clomiphene d/c'd-topical testo started. His hypogonadism is due to insulin resistance syndrome.  As of 08/2017 endo f/u: pt staying on clomiphene (no testost) and is feeling some improved.  No imp 11/2017 f/u on topical testost (off clom)   Insomnia    Internal hemorrhoids, bleeding and sicharge 05/29/2013   OSA on CPAP    setting changed to 6 cm H20 (fixed) 10/2016 by pulm.   Rotator cuff tear 2011   [facility]: conservative mgmt--symptoms stable as of 04/2011.    Past Surgical History:  Procedure Laterality Date   adnoids     CARDIOVASCULAR STRESS TEST     Normal stress nuclear study, EF normal. +Hypertensive bp response--subsequent 24H ambulatory BP monitoring confirmed HTN.   COLONOSCOPY  05/2006, 05/2012   HEMORRHOID BANDING     POLYPECTOMY     Adenomatous 2007; hyperplastic 2013.  Repeat 5 yrs.   TRANSTHORACIC ECHOCARDIOGRAM  01/30/16   Normal except mild aortic dilatation   WISDOM TOOTH EXTRACTION      Family History  Problem Relation Age of Onset   Hypertension Mother    Diabetes Mother     CVA Mother    Hypertension Father    Diabetes Father    Dementia Father    Heart disease Father    CAD Father     Social History:  reports that he has never smoked. He has never used smokeless tobacco. He reports current alcohol use. He reports that he does not use drugs.  Review of Systems  HYPERLIPIDEMIA: Has mixed hyperlipidemia  He has been on fenofibrate and taking regularly Previously on Lipitor 40 mg and now back on 10 mg in addition to his fenofibrate   LDL is better as well as triglycerides on the last visit   Lab Results  Component Value Date   CHOL 131 10/02/2021   CHOL 137 07/03/2021   CHOL 141 04/08/2021   Lab Results  Component Value Date   HDL 32.90 (L) 10/02/2021   HDL 33.20 (L) 07/03/2021   HDL 30.70 (L) 04/08/2021   Lab Results  Component Value Date   LDLCALC 60 10/02/2021   LDLCALC 70 04/08/2021   LDLCALC 87 06/14/2019   Lab Results  Component Value Date   TRIG 188.0 (H) 10/02/2021   TRIG 269.0 (H) 07/03/2021   TRIG 198.0 (H) 04/08/2021   Lab Results  Component Value Date   CHOLHDL 4 10/02/2021   CHOLHDL 4 07/03/2021   CHOLHDL 5 04/08/2021   Lab Results  Component Value Date   LDLDIRECT 76.0 07/03/2021   LDLDIRECT 101.0 01/02/2021   LDLDIRECT 87.0 09/17/2020    HYPERTENSION:     He is on losartan 50 mg daily  Blood pressure is monitored at home: BP last 116/? No lightheadedness except when he is late for meals  BP Readings from Last 3 Encounters:  01/22/22 122/72  10/08/21 130/82  07/08/21 140/82   Creatinine history:  Lab Results  Component Value Date   CREATININE 1.13 01/01/2022   CREATININE 1.04 10/02/2021   CREATININE 0.97 07/03/2021      General Examination:   BP 122/72   Pulse 83   Ht '5\' 10"'$  (1.778 m)   Wt 202 lb 9.6 oz (91.9 kg)   SpO2 96%   BMI 29.07 kg/m    Assessment/ Plan:    Hypogonadotropic hypogonadism, long-standing   He has been on  JATENZO which was on a rise from his insurance and has  had better energy level since then He is taking this regularly Testosterone level is excellent and he has no increase in hemoglobin We will continue the same dose but have his lab was done 6 hours after his morning dose   HYPERLIPIDEMIA: Has mixed hyperlipidemia and will need follow-up: Next visit  DIABETES type 2:  A1c is slightly better at 7  Although he is getting better sugar control he has not likely been consistent with diet with some weight gain Also can do more regular exercise To start checking his blood sugars regularly and he will call for supplies for his [employer] meter  Continue 1 mg Ozempic weekly along with [place] and metformin  HYPERTENSION: Blood pressure is controlled with losartan To monitor blood pressure regularly at home and let us know if it is out of range      There are no Patient Instructions on file for this visit.     Elayne Snare 01/22/2022, 2:48 PM

## 2022-03-12 ENCOUNTER — Other Ambulatory Visit: Payer: Self-pay | Admitting: Endocrinology

## 2022-04-09 ENCOUNTER — Other Ambulatory Visit: Payer: Self-pay | Admitting: Endocrinology

## 2022-05-01 ENCOUNTER — Other Ambulatory Visit: Payer: Self-pay | Admitting: Endocrinology

## 2022-05-13 ENCOUNTER — Other Ambulatory Visit: Payer: Self-pay | Admitting: Endocrinology

## 2022-05-18 ENCOUNTER — Encounter: Payer: Self-pay | Admitting: Gastroenterology

## 2022-05-20 ENCOUNTER — Other Ambulatory Visit: Payer: Self-pay | Admitting: Endocrinology

## 2022-05-26 ENCOUNTER — Encounter: Payer: Self-pay | Admitting: Internal Medicine

## 2022-05-26 ENCOUNTER — Other Ambulatory Visit: Payer: 59

## 2022-05-28 ENCOUNTER — Ambulatory Visit: Payer: 59 | Admitting: Endocrinology

## 2022-05-29 ENCOUNTER — Encounter: Payer: Self-pay | Admitting: Endocrinology

## 2022-06-01 ENCOUNTER — Other Ambulatory Visit: Payer: Self-pay | Admitting: Endocrinology

## 2022-06-01 MED ORDER — JATENZO 158 MG PO CAPS: 1.0000 | ORAL_CAPSULE | Freq: Two times a day (BID) | ORAL | 2 refills | Status: DC

## 2022-06-01 MED ORDER — JATENZO 158 MG PO CAPS
1.0000 | ORAL_CAPSULE | Freq: Two times a day (BID) | ORAL | 2 refills | Status: DC
Start: 2022-06-01 — End: 2022-08-27

## 2022-06-16 ENCOUNTER — Other Ambulatory Visit: Payer: 59

## 2022-06-16 ENCOUNTER — Ambulatory Visit (AMBULATORY_SURGERY_CENTER): Payer: Self-pay

## 2022-06-16 VITALS — Ht 70.0 in | Wt 201.0 lb

## 2022-06-16 DIAGNOSIS — Z1211 Encounter for screening for malignant neoplasm of colon: Secondary | ICD-10-CM

## 2022-06-16 NOTE — Progress Notes (Signed)

## 2022-06-17 ENCOUNTER — Other Ambulatory Visit (INDEPENDENT_AMBULATORY_CARE_PROVIDER_SITE_OTHER): Payer: 59

## 2022-06-17 DIAGNOSIS — E1165 Type 2 diabetes mellitus with hyperglycemia: Secondary | ICD-10-CM

## 2022-06-17 DIAGNOSIS — E230 Hypopituitarism: Secondary | ICD-10-CM

## 2022-06-17 LAB — COMPREHENSIVE METABOLIC PANEL
ALT: 15 U/L (ref 0–53)
AST: 11 U/L (ref 0–37)
Albumin: 4.5 g/dL (ref 3.5–5.2)
Alkaline Phosphatase: 37 U/L — ABNORMAL LOW (ref 39–117)
BUN: 18 mg/dL (ref 6–23)
CO2: 26 mEq/L (ref 19–32)
Calcium: 9.6 mg/dL (ref 8.4–10.5)
Chloride: 103 mEq/L (ref 96–112)
Creatinine, Ser: 0.93 mg/dL (ref 0.40–1.50)
GFR: 87.58 mL/min (ref >60.00)
Glucose, Bld: 118 mg/dL — ABNORMAL HIGH (ref 70–99)
Potassium: 3.7 mEq/L (ref 3.5–5.1)
Sodium: 138 mEq/L (ref 135–145)
Total Bilirubin: 0.4 mg/dL (ref 0.2–1.2)
Total Protein: 7.4 g/dL (ref 6.0–8.3)

## 2022-06-17 LAB — CBC
HCT: 44.9 % (ref 39.0–52.0)
Hemoglobin: 14.9 g/dL (ref 13.0–17.0)
MCHC: 33.2 g/dL (ref 30.0–36.0)
MCV: 83.2 fl (ref 78.0–100.0)
Platelets: 260 10*3/uL (ref 150.0–400.0)
RBC: 5.4 Mil/uL (ref 4.22–5.81)
RDW: 14.2 % (ref 11.5–15.5)
WBC: 8.5 10*3/uL (ref 4.0–10.5)

## 2022-06-17 LAB — LIPID PANEL
Cholesterol: 127 mg/dL (ref 0–200)
HDL: 24.5 mg/dL — ABNORMAL LOW (ref >39.00)
NonHDL: 102.48
Total CHOL/HDL Ratio: 5
Triglycerides: 330 mg/dL — ABNORMAL HIGH (ref 0.0–149.0)
VLDL: 66 mg/dL — ABNORMAL HIGH (ref 0.0–40.0)

## 2022-06-17 LAB — LDL CHOLESTEROL, DIRECT: Direct LDL: 64 mg/dL

## 2022-06-17 LAB — HEMOGLOBIN A1C: Hgb A1c MFr Bld: 7.5 % — ABNORMAL HIGH (ref 4.6–6.5)

## 2022-06-17 LAB — TESTOSTERONE: Testosterone: 261.53 ng/dL — ABNORMAL LOW (ref 300.00–890.00)

## 2022-06-18 ENCOUNTER — Ambulatory Visit: Payer: 59 | Admitting: Endocrinology

## 2022-06-18 ENCOUNTER — Other Ambulatory Visit: Payer: Self-pay

## 2022-06-18 ENCOUNTER — Encounter: Payer: Self-pay | Admitting: Endocrinology

## 2022-06-18 ENCOUNTER — Other Ambulatory Visit (HOSPITAL_COMMUNITY): Payer: Self-pay

## 2022-06-18 ENCOUNTER — Other Ambulatory Visit: Payer: Self-pay | Admitting: Endocrinology

## 2022-06-18 VITALS — BP 142/70 | HR 79 | Ht 70.0 in | Wt 209.0 lb

## 2022-06-18 DIAGNOSIS — Z23 Encounter for immunization: Secondary | ICD-10-CM | POA: Diagnosis not present

## 2022-06-18 DIAGNOSIS — E782 Mixed hyperlipidemia: Secondary | ICD-10-CM | POA: Diagnosis not present

## 2022-06-18 DIAGNOSIS — E1165 Type 2 diabetes mellitus with hyperglycemia: Secondary | ICD-10-CM | POA: Diagnosis not present

## 2022-06-18 DIAGNOSIS — E230 Hypopituitarism: Secondary | ICD-10-CM | POA: Diagnosis not present

## 2022-06-18 LAB — POCT GLUCOSE (DEVICE FOR HOME USE): POC Glucose: 209 mg/dl — AB (ref 70–99)

## 2022-06-18 MED ORDER — GLUCOSE BLOOD VI STRP: ORAL_STRIP | 12 refills | Status: DC

## 2022-06-18 MED ORDER — OZEMPIC (1 MG/DOSE) 4 MG/3ML ~~LOC~~ SOPN
PEN_INJECTOR | SUBCUTANEOUS | 1 refills | Status: DC
  Filled 2022-06-18: qty 3, 28d supply, fill #0

## 2022-06-18 NOTE — Patient Instructions (Addendum)
Check blood sugars on waking up days a week  Also check blood sugars about 2 hours after meals and do this after different meals by rotation  Recommended blood sugar levels on waking up are 90-130 and about 2 hours after meal is 130-160  Please bring your blood sugar monitor to each visit, thank you  Take fenofibrate daily  Walk on days off

## 2022-06-18 NOTE — Progress Notes (Signed)
Patient ID: Douglas [name], male   DOB: 1961-06-08, 61 y.o.   MRN: [id number]           Chief complaint: Endocrinology follow-up    History of Present Illness  PROBLEM 1:  Hypogonadism: This was diagnosed in 2009  He was apparently initially seen by his psychiatrist and was having difficulties with fatigue, agoraphobia and decreased libido as well as sexual dysfunction. He was told that his testosterone level was significantly low and was given testosterone supplements probably with AndroGel Subsequently the patient has been treated by various physicians including the urologist with testosterone supplements, mostly AndroGel He thinks that his testosterone levels have never been normal with AndroGel although he thinks that he felt a little better with taking this from his urologist who called him to apply it on his abdomen  He was also tried on monthly injections for about 3 months without consistent benefit also In 2016 he was given [place] again without any benefit   RECENT history: He had baseline complaints of fatigue, decreased motivation, decreased libido and erectile dysfunction from hypogonadism  Initial evaluation showed low free testosterone of 4.5 and normal LH  He had been on clomiphene 25 mg daily after his initial consultation in 09/2015  Because of continued fatigue, decreased libido and testosterone levels being on the low end of normal he was switched to ANDROGEL 3 pumps daily in 11/2017 He was having difficulty applying this in the mornings because of not having time before going to work With this his testosterone levels could be consistently low  He was switched to Androderm, 8 mg daily in October 2020 With the treatment he had improved energy level and less weakness; however because of skin irritation this was discontinued  He was given a prescription for Natesto but he tends to have nasal congestion and blockage with this and this was stopped  He is taking  JATENZO after prior authorization He is taking it with breakfast and dinner without side effects However he did not get his prescription filled in September and only started back a week ago His labs were done in the morning fasting late morning but he thinks he had taken his [name] that morning, otherwise takes [name] with food  Even with his testosterone level going down he does not think he has any recent fatigue   His lab results as follows:  Lab Results  Component Value Date   TESTOSTERONE 261.53 (L) 06/17/2022   TESTOSTERONE 397.00 01/01/2022   TESTOSTERONE 217.43 (L) 10/02/2021   TESTOSTERONE 138.45 (L) 07/03/2021     Lab Results  Component Value Date   LH 4.30 11/07/2018   Lab Results  Component Value Date   HGB 14.9 06/17/2022    DIABETES type II: He had significant hyperglycemia at onset with A1c about 13  Since he was not benefiting from Actos and metformin given by his PCP he was given Invokana in addition in August 2018 Since 07/2017 he has been on Ozempic  Non-insulin HYPOGLYCEMIC regimen: Farxiga 10 mg daily, metformin 1000 mg twice daily and Ozempic '1mg'$  weekly  His A1c has been fluctuating, now 7.5  Current management, problems identified and blood sugar patterns: He did not bring his monitor for download and says he is out of test strips  His lab fasting glucose late morning was 118 However he has not been taking his Ozempic regularly as he had difficulty with the supply and likely has gained weight from not taking this also He is not doing  any formal exercise now although he thinks he is walking a lot at work   His last consultation with dietitian was in 2016  Blood sugars at home: Not available       Wt Readings from Last 3 Encounters:  06/18/22 209 lb (94.8 kg)  06/16/22 201 lb (91.2 kg)  01/22/22 202 lb 9.6 oz (91.9 kg)     Lab Results  Component Value Date   HGBA1C 7.5 (H) 06/17/2022   HGBA1C 7.0 (H) 01/01/2022   HGBA1C 7.3 (H)  10/02/2021   Lab Results  Component Value Date   MICROALBUR 1.5 10/02/2021   [place] 60 10/02/2021   CREATININE 0.93 06/17/2022   No other active problems: See review of systems   Allergies as of 06/18/2022       Reactions   Molds & Smuts Other (See Comments)        Medication List        Accurate as of June 18, 2022 11:59 PM. If you have any questions, ask your nurse or doctor.          atorvastatin 10 MG tablet Commonly known as: LIPITOR TAKE 1 TABLET BY MOUTH EVERY DAY   Farxiga 10 MG Tabs tablet Generic drug: dapagliflozin propanediol TAKE 1 TABLET BY MOUTH EVERY DAY   fenofibrate 145 MG tablet Commonly known as: TRICOR TAKE 1 TABLET BY MOUTH EVERY DAY   glucose blood test strip Check glucose bid   Jatenzo 158 MG Caps Generic drug: Testosterone Undecanoate Take 1 capsule by mouth 2 (two) times daily after a meal.   losartan 50 MG tablet Commonly known as: COZAAR TAKE 1 TABLET BY MOUTH EVERY DAY   metFORMIN 1000 MG tablet Commonly known as: GLUCOPHAGE TAKE 1 TABLET BY MOUTH 2 TIMES DAILY WITH A MEAL.   Ozempic (1 MG/DOSE) 4 MG/3ML Sopn Generic drug: Semaglutide (1 MG/DOSE) INJECT '1MG'$  INTO THE SKIN ONCE A WEEK        Allergies:  Allergies  Allergen Reactions   Molds & Smuts Other (See Comments)    Past Medical History:  Diagnosis Date   Acrophobia    Occasionally requires xanax when he is required to work in high places as part of his occupation.   Adenomatous colon polyp 05/07/06; 05/2012   Repeat colonoscopy 05/17/2012 showed 2 small polyps that were removed--path showed Hyperplastic-not adenomatous.  Still needs repeat 5 yrs.   Angioedema of lips    saw allergist 11/2011   Asthmatic bronchitis , chronic 01/11/2012   Question occupational asthma with no specific agents identified. He does notice that some areas of his job are particularly dusty and dirty. Currently he is well controlled using Symbicort through an AeroChamber and rarely  needing his rescue inhaler. This would be an acceptable long-term status. Elevated nonspecific total IgE and peripheral eosinophilia to suggest an atopic trigger with sensitization to something Consider Daliresp for future use if needed    Chronic fatigue    +daytime somnolence   Chronic recurrent sinusitis 02/28/2012   Depression    Prozac in the past not much help.  Spontaneously resolved.   Diabetes mellitus without complication ([place])    managed by Dr. Dwyane Dee (endo)--this is accurate as of 02/2018, when pt last saw Dr. Dwyane Dee.   Diverticulosis    Generalized anxiety disorder 08/11/2012   Hemorrhoids    ext and int   Hyperlipemia, mixed    elev trigs with low HDL. Fish oil OTC.  Statin started in Fall 2018.  Fenofibrate started  07/2018   Hypertension 2017   Hypogonadotropic hypogonadism ([place]) 04/08/2011   As of 2017/18/19, Dr. Dwyane Dee (endo) is following: he started clomiph but no better at f/u 11/2016.  Pt denied any improvemt,though testost improved.  11/2017 Clomiphene d/c'd-topical testo started. His hypogonadism is due to insulin resistance syndrome.  As of 08/2017 endo f/u: pt staying on clomiphene (no testost) and is feeling some improved.  No imp 11/2017 f/u on topical testost (off clom)   Insomnia    Internal hemorrhoids, bleeding and sicharge 05/29/2013   OSA on CPAP    setting changed to 6 cm H20 (fixed) 10/2016 by pulm.   Rotator cuff tear 2011   [facility]: conservative mgmt--symptoms stable as of 04/2011.    Past Surgical History:  Procedure Laterality Date   adnoids     CARDIOVASCULAR STRESS TEST     Normal stress nuclear study, EF normal. +Hypertensive bp response--subsequent 24H ambulatory BP monitoring confirmed HTN.   COLONOSCOPY  05/2006, 05/2012   HEMORRHOID BANDING     POLYPECTOMY     Adenomatous 2007; hyperplastic 2013.  Repeat 5 yrs.   TRANSTHORACIC ECHOCARDIOGRAM  01/30/16   Normal except mild aortic dilatation   WISDOM TOOTH EXTRACTION      Family History   Problem Relation Age of Onset   Hypertension Mother    Diabetes Mother    CVA Mother    Hypertension Father    Diabetes Father    Dementia Father    Heart disease Father    CAD Father     Social History:  reports that he has never smoked. He has never used smokeless tobacco. He reports current alcohol use. He reports that he does not use drugs.  Review of Systems  HYPERLIPIDEMIA: Has mixed hyperlipidemia  He has been on fenofibrate and taking regularly Previously on Lipitor 40 mg and now back on 10 mg in addition to his fenofibrate  LDL is better but his triglycerides have gone up and he does not know why, has not been consuming any alcohol and diet has not been significantly different, however has gained weight   Lab Results  Component Value Date   CHOL 127 06/17/2022   CHOL 131 10/02/2021   CHOL 137 07/03/2021   Lab Results  Component Value Date   HDL 24.50 (L) 06/17/2022   HDL 32.90 (L) 10/02/2021   HDL 33.20 (L) 07/03/2021   Lab Results  Component Value Date   LDLCALC 60 10/02/2021   LDLCALC 70 04/08/2021   LDLCALC 87 06/14/2019    Lab Results  Component Value Date   TRIG 330.0 (H) 06/17/2022   TRIG 188.0 (H) 10/02/2021   TRIG 269.0 (H) 07/03/2021   Lab Results  Component Value Date   CHOLHDL 5 06/17/2022   CHOLHDL 4 10/02/2021   CHOLHDL 4 07/03/2021   Lab Results  Component Value Date   LDLDIRECT 64.0 06/17/2022   LDLDIRECT 76.0 07/03/2021   LDLDIRECT 101.0 01/02/2021    HYPERTENSION:     He is on losartan 50 mg daily  Blood pressure is monitored at home: Blood pressure readings 628-315 systolic at home  BP Readings from Last 3 Encounters:  06/18/22 (!) 142/70  01/22/22 122/72  10/08/21 130/82   Creatinine history:  Lab Results  Component Value Date   CREATININE 0.93 06/17/2022   CREATININE 1.13 01/01/2022   CREATININE 1.04 10/02/2021    General Examination:   BP (!) 142/70 (BP Location: Left Arm, Patient Position: Sitting,  Cuff Size: Normal)  Pulse 79   Ht '5\' 10"'$  (1.778 m)   Wt 209 lb (94.8 kg)   SpO2 97%   BMI 29.99 kg/m    Assessment/ Plan:    Hypogonadotropic hypogonadism, long-standing   He has been on JATENZO which was on a rise from his insurance and has had better energy level since then He is not taking this regularly causing his testosterone level to be low  Discussed importance of taking this regularly with food and refilling on time Provided him to have his labs checked for testosterone level in the last 6 weeks, 6 hours after his morning dose  HYPERLIPIDEMIA: Has mixed hyperlipidemia with worsening triglycerides, not clear if he has taken his fenofibrate even though he thinks he is Also may improve with weight loss LDL okay  DIABETES type 2:  A1c is slightly higher at 7.5 from likely not getting his Ozempic regularly Also has gained weight  Discussed need for weight loss and consistent diet He needs to do regular exercise with walking on his days off To start checking his blood sugars regularly and new prescription sent for strips  We will refill 1 mg Ozempic weekly at the local hospital pharmacy and keep taking with Wilder Glade and metformin  HYPERTENSION: Blood pressure is somewhat variably controlled with losartan To monitor blood pressure regularly at home and also follow-up with PCP     Patient Instructions  Check blood sugars on waking up days a week  Also check blood sugars about 2 hours after meals and do this after different meals by rotation  Recommended blood sugar levels on waking up are 90-130 and about 2 hours after meal is 130-160  Please bring your blood sugar monitor to each visit, thank you  Take fenofibrate daily  Walk on days off   Flu vaccine given   Elayne Snare 06/19/2022, 9:30 PM

## 2022-06-26 ENCOUNTER — Other Ambulatory Visit (HOSPITAL_COMMUNITY): Payer: Self-pay

## 2022-06-30 ENCOUNTER — Other Ambulatory Visit: Payer: Self-pay | Admitting: Endocrinology

## 2022-07-13 ENCOUNTER — Encounter: Payer: Self-pay | Admitting: Internal Medicine

## 2022-07-14 ENCOUNTER — Telehealth: Payer: Self-pay | Admitting: Internal Medicine

## 2022-07-14 NOTE — Telephone Encounter (Signed)
Noted - not a problem

## 2022-07-14 NOTE — Telephone Encounter (Signed)
Patient called to let the doctor know he was given Sertraline medication 50 mg once a day by his PCP.

## 2022-07-16 ENCOUNTER — Ambulatory Visit (AMBULATORY_SURGERY_CENTER): Payer: 59 | Admitting: Internal Medicine

## 2022-07-16 ENCOUNTER — Encounter: Payer: Self-pay | Admitting: Internal Medicine

## 2022-07-16 VITALS — BP 129/73 | HR 66 | Temp 98.1°F | Resp 13 | Ht 70.0 in | Wt 201.0 lb

## 2022-07-16 DIAGNOSIS — D120 Benign neoplasm of cecum: Secondary | ICD-10-CM

## 2022-07-16 DIAGNOSIS — Z86010 Personal history of colonic polyps: Secondary | ICD-10-CM | POA: Diagnosis not present

## 2022-07-16 DIAGNOSIS — Z09 Encounter for follow-up examination after completed treatment for conditions other than malignant neoplasm: Secondary | ICD-10-CM

## 2022-07-16 DIAGNOSIS — Z1211 Encounter for screening for malignant neoplasm of colon: Secondary | ICD-10-CM

## 2022-07-16 DIAGNOSIS — K621 Rectal polyp: Secondary | ICD-10-CM

## 2022-07-16 DIAGNOSIS — D128 Benign neoplasm of rectum: Secondary | ICD-10-CM

## 2022-07-16 MED ORDER — SODIUM CHLORIDE 0.9 % IV SOLN: 500.0000 mL | Freq: Once | INTRAVENOUS | Status: DC

## 2022-07-16 NOTE — Progress Notes (Signed)
Called to room to assist during endoscopic procedure.  Patient ID and intended procedure confirmed with present staff. Received instructions for my participation in the procedure from the performing physician.  

## 2022-07-16 NOTE — Progress Notes (Signed)
Sedate, gd SR, tolerated procedure well, VSS, report to RN 

## 2022-07-16 NOTE — Progress Notes (Signed)
[facility] History and Physical   Primary Care Physician:  [facility], [facility]   Reason for Procedure:  Hx polyps  Plan:    colonoscopy   HPI: Douglas [name] is a 61 y.o. 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   Adenomatous colon polyp 05/07/06; 05/2012   Repeat colonoscopy 05/17/2012 showed 2 small polyps that were removed--path showed Hyperplastic-not adenomatous.  Still needs repeat 5 yrs.   Angioedema of lips    saw allergist 11/2011   Asthmatic bronchitis , chronic 01/11/2012   Question occupational asthma with no specific agents identified. He does notice that some areas of his job are particularly dusty and dirty. Currently he is well controlled using Symbicort through an AeroChamber and rarely needing his rescue inhaler. This would be an acceptable long-term status. Elevated nonspecific total IgE and peripheral eosinophilia to suggest an atopic trigger with sensitization to something Consider Daliresp for future use if needed    Chronic fatigue    +daytime somnolence   Chronic recurrent sinusitis 02/28/2012   Depression    Prozac in the past not much help.  Spontaneously resolved.   Diabetes mellitus without complication ([name])    managed by Dr. Dwyane Dee (endo)--this is accurate as of 02/2018, when pt last saw Dr. Dwyane Dee.   Diverticulosis    Generalized anxiety disorder 08/11/2012   Hemorrhoids    ext and int   Hyperlipemia, mixed    elev trigs with low HDL. Fish oil OTC.  Statin started in Fall 2018.  Fenofibrate started 07/2018   Hypertension 2017   Hypogonadotropic hypogonadism ([place]) 04/08/2011   As of 2017/18/19, Dr. Dwyane Dee ([facility]) is following: he started clomiph but no better at f/u 11/2016.  Pt denied any improvemt,though testost improved.   11/2017 Clomiphene d/c'd-topical testo started. His hypogonadism is due to insulin resistance syndrome.  As of 08/2017 endo f/u: pt staying on clomiphene (no testost) and is feeling some improved.  No imp 11/2017 f/u on topical testost (off clom)   Insomnia    Internal hemorrhoids, bleeding and sicharge 05/29/2013   OSA on CPAP    setting changed to 6 cm H20 (fixed) 10/2016 by pulm.   Rotator cuff tear 2011   [facility]: conservative mgmt--symptoms stable as of 04/2011.    Past Surgical History:  Procedure Laterality Date   adnoids     CARDIOVASCULAR STRESS TEST     Normal stress nuclear study, EF normal. +Hypertensive bp response--subsequent 24H ambulatory BP monitoring confirmed HTN.   COLONOSCOPY  05/2006, 05/2012   HEMORRHOID BANDING     POLYPECTOMY     Adenomatous 2007; hyperplastic 2013.  Repeat 5 yrs.   TRANSTHORACIC ECHOCARDIOGRAM  01/30/16   Normal except mild aortic dilatation   WISDOM TOOTH EXTRACTION      Prior to Admission medications   Medication Sig Start Date End Date Taking? Authorizing Provider  atorvastatin (LIPITOR) 10 MG tablet TAKE 1 TABLET BY MOUTH EVERY DAY 04/09/22  Yes Elayne Snare, MD  FARXIGA 10 MG TABS tablet TAKE 1 TABLET BY MOUTH EVERY DAY 01/12/22  Yes Elayne Snare, MD  fenofibrate (TRICOR) 145 MG tablet TAKE 1 TABLET BY MOUTH EVERY DAY 07/01/22  Yes Elayne Snare, MD  glucose blood (ONETOUCH ULTRA) test strip Check blood sugar once  a day at at different times 06/20/22  Yes Elayne Snare, MD  losartan (COZAAR) 50 MG tablet TAKE 1 TABLET BY MOUTH EVERY DAY 12/04/21  Yes Elayne Snare, MD  metFORMIN (GLUCOPHAGE) 1000 MG tablet TAKE 1 TABLET BY MOUTH 2 TIMES DAILY WITH A MEAL. 03/12/22  Yes Elayne Snare, MD  sertraline (ZOLOFT) 50 MG tablet Take 50 mg by mouth daily. 06/23/22  Yes [provider]  Testosterone Undecanoate (JATENZO) 158 MG CAPS Take 1 capsule by mouth 2 (two) times daily after a meal. 06/01/22  Yes Elayne Snare, MD  Semaglutide, 1 MG/DOSE, (OZEMPIC, 1  MG/DOSE,) 4 MG/3ML SOPN INJECT '1MG'$  INTO THE SKIN ONCE A WEEK 06/18/22   Elayne Snare, MD    Current Outpatient Medications  Medication Sig Dispense Refill   atorvastatin (LIPITOR) 10 MG tablet TAKE 1 TABLET BY MOUTH EVERY DAY 90 tablet 0   FARXIGA 10 MG TABS tablet TAKE 1 TABLET BY MOUTH EVERY DAY 90 tablet 3   fenofibrate (TRICOR) 145 MG tablet TAKE 1 TABLET BY MOUTH EVERY DAY 90 tablet 0   glucose blood (ONETOUCH ULTRA) test strip Check blood sugar once a day at at different times 50 each 12   losartan (COZAAR) 50 MG tablet TAKE 1 TABLET BY MOUTH EVERY DAY 90 tablet 1   metFORMIN (GLUCOPHAGE) 1000 MG tablet TAKE 1 TABLET BY MOUTH 2 TIMES DAILY WITH A MEAL. 180 tablet 1   sertraline (ZOLOFT) 50 MG tablet Take 50 mg by mouth daily.     Testosterone Undecanoate (JATENZO) 158 MG CAPS Take 1 capsule by mouth 2 (two) times daily after a meal. 60 capsule 2   Semaglutide, 1 MG/DOSE, (OZEMPIC, 1 MG/DOSE,) 4 MG/3ML SOPN INJECT '1MG'$  INTO THE SKIN ONCE A WEEK 15 mL 1   Current Facility-Administered Medications  Medication Dose Route Frequency Provider Last Rate Last Admin   0.9 %  sodium chloride infusion  500 mL Intravenous Once Gatha Mayer, MD        Allergies as of 07/16/2022 - Review Complete 07/16/2022  Allergen Reaction Noted   Molds & smuts Other (See Comments) 07/03/2014    Family History  Problem Relation Age of Onset   Hypertension Mother    Diabetes Mother    CVA Mother    Hypertension Father    Diabetes Father    Dementia Father    Heart disease Father    CAD Father    Colon cancer Neg Hx    Stomach cancer Neg Hx    Rectal cancer Neg Hx     Social History   Socioeconomic History   Marital status: Married    Spouse name: Not on file   Number of children: 1   Years of education: Not on file   Highest education level: Not on file  Occupational History   Occupation: Arts development officer: [employer]  Tobacco Use   Smoking status: Never   Smokeless  tobacco: Never  Vaping Use   Vaping Use: Never used  Substance and Sexual Activity   Alcohol use: Yes    Alcohol/week: 0.0 standard drinks of alcohol    Comment: rarely   Drug use: No   Sexual activity: Not on file  Other Topics Concern   Not on file  Social History Narrative   Married, one 54 y/o daughter, wife is [place].   Grew up in [place] in [place] family (four sisters).   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  No T/A/Ds.  Works out regularly.   Social Determinants of Health   Financial Resource Strain: Not on file  Food Insecurity: Not on file  Transportation Needs: Not on file  Physical Activity: Not on file  Stress: Not on file  Social Connections: Not on file  Intimate Partner Violence: Not on file    Review of Systems:  All other review of systems negative except as mentioned in the HPI.  Physical Exam: Vital signs BP 103/85   Pulse 74   Temp 98.1 F (36.7 C)   Ht '5\' 10"'$  (1.778 m)   Wt 201 lb (91.2 kg)   SpO2 97%   BMI 28.84 kg/m   General:   Alert,  Well-developed, well-nourished, pleasant and cooperative in NAD Lungs:  Clear throughout to auscultation.   Heart:  Regular rate and rhythm; no murmurs, clicks, rubs,  or gallops. Abdomen:  Soft, nontender and nondistended. Normal bowel sounds.   Neuro/Psych:  Alert and cooperative. Normal mood and affect. A and O x 3   '@Leonna Schlee'$  Simonne Maffucci, MD, [facility] [phone] (pager) 07/16/2022 10:17 AM@

## 2022-07-16 NOTE — Op Note (Signed)
[facility] Patient Name: Douglas [name] Procedure Date: 07/16/2022 10:10 AM MRN: [id number] Endoscopist: Gatha Mayer , MD, [id number] Age: 61 Referring MD:  Date of Birth: 02/24/1961 Gender: Male Account #: [id number] Procedure:                Colonoscopy Indications:              Surveillance: Personal history of adenomatous                            polyps on last colonoscopy > 5 years ago, Last                            colonoscopy: 2013 Medicines:                Monitored Anesthesia Care Procedure:                Pre-Anesthesia Assessment:                           - Prior to the procedure, a History and Physical                            was performed, and patient medications and                            allergies were reviewed. The patient's tolerance of                            previous anesthesia was also reviewed. The risks                            and benefits of the procedure and the sedation                            options and risks were discussed with the patient.                            All questions were answered, and informed consent                            was obtained. Prior Anticoagulants: The patient has                            taken no anticoagulant or antiplatelet agents. ASA                            Grade Assessment: III - A patient with severe                            systemic disease. After reviewing the risks and                            benefits, the patient was deemed in satisfactory  condition to undergo the procedure.                           After obtaining informed consent, the colonoscope                            was passed under direct vision. Throughout the                            procedure, the patient's blood pressure, pulse, and                            oxygen saturations were monitored continuously. The                            Olympus CF-HQ190L [id number])  Colonoscope was                            introduced through the anus and advanced to the the                            cecum, identified by appendiceal orifice and                            ileocecal valve. The colonoscopy was performed                            without difficulty. The patient tolerated the                            procedure well. The quality of the bowel                            preparation was good. The bowel preparation used                            was Miralax via split dose instruction. The                            ileocecal valve, appendiceal orifice, and rectum                            were photographed. Scope In: 10:29:32 AM Scope Out: 10:44:57 AM Scope Withdrawal Time: 0 hours 12 minutes 8 seconds  Total Procedure Duration: 0 hours 15 minutes 25 seconds  Findings:                 The perianal and digital rectal examinations were                            normal. Pertinent negatives include normal prostate                            (size, shape, and consistency).  Two sessile polyps were found in the rectum and                            cecum. The polyps were diminutive in size. These                            polyps were removed with a cold snare. Resection                            and retrieval were complete. Verification of                            patient identification for the specimen was done.                            Estimated blood loss was minimal.                           A few small-mouthed diverticula were found in the                            sigmoid colon.                           Internal hemorrhoids were found.                           The exam was otherwise without abnormality on                            direct and retroflexion views. Complications:            No immediate complications. Estimated Blood Loss:     Estimated blood loss was minimal. Impression:               - Two diminutive  polyps in the rectum and in the                            cecum, removed with a cold snare. Resected and                            retrieved.                           - Diverticulosis in the sigmoid colon.                           - Internal hemorrhoids.                           - The examination was otherwise normal on direct                            and retroflexion views.                           -  Personal history of colonic polyps. 2007 2                            adenomas max 7 mm, 2013 hyperplastic polyp Recommendation:           - Patient has a contact number available for                            emergencies. The signs and symptoms of potential                            delayed complications were discussed with the                            patient. Return to normal activities tomorrow.                            Written discharge instructions were provided to the                            patient.                           - Resume previous diet.                           - Continue present medications.                           - Repeat colonoscopy is recommended. The                            colonoscopy date will be determined after pathology                            results from today's exam become available for                            review. Gatha Mayer, MD 07/16/2022 10:56:26 AM This report has been signed electronically.

## 2022-07-16 NOTE — Progress Notes (Signed)
Pt's states no medical or surgical changes since previsit or office visit. 

## 2022-07-16 NOTE — Patient Instructions (Addendum)
Handouts provided about hemorrhoids, diverticulosis and polyps.  Await pathology result.  Repeat colonoscopy is recommended, date to be determined after pathology results from today's exam become available for review.   YOU HAD AN ENDOSCOPIC PROCEDURE TODAY AT [facility]:   Refer to the procedure report that was given to you for any specific questions about what was found during the examination.  If the procedure report does not answer your questions, please call your gastroenterologist to clarify.  If you requested that your care partner not be given the details of your procedure findings, then the procedure report has been included in a sealed envelope for you to review at your convenience later.  YOU SHOULD EXPECT: Some feelings of bloating in the abdomen. Passage of more gas than usual.  Walking can help get rid of the air that was put into your GI tract during the procedure and reduce the bloating. If you had a lower endoscopy (such as a colonoscopy or flexible sigmoidoscopy) you may notice spotting of blood in your stool or on the toilet paper. If you underwent a bowel prep for your procedure, you may not have a normal bowel movement for a few days.  Please Note:  You might notice some irritation and congestion in your nose or some drainage.  This is from the oxygen used during your procedure.  There is no need for concern and it should clear up in a day or so.  SYMPTOMS TO REPORT IMMEDIATELY:  Following lower endoscopy (colonoscopy or flexible sigmoidoscopy):  Excessive amounts of blood in the stool  Significant tenderness or worsening of abdominal pains  Swelling of the abdomen that is new, acute  Fever of 100F or higher  For urgent or emergent issues, a gastroenterologist can be reached at any hour by calling [phone]. Do not use MyChart messaging for urgent concerns.    DIET:  We do recommend a small meal at first, but then you may proceed to your regular diet.   Drink plenty of fluids but you should avoid alcoholic beverages for 24 hours.  ACTIVITY:  You should plan to take it easy for the rest of today and you should NOT DRIVE or use heavy machinery until tomorrow (because of the sedation medicines used during the test).    FOLLOW UP: Our staff will call the number listed on your records the next business day following your procedure.  We will call around 7:15- 8:00 am to check on you and address any questions or concerns that you may have regarding the information given to you following your procedure. If we do not reach you, we will leave a message.     If any biopsies were taken you will be contacted by phone or by letter within the next 1-3 weeks.  Please call us at [phone] if you have not heard about the biopsies in 3 weeks.    SIGNATURES/CONFIDENTIALITY: You and/or your care partner have signed paperwork which will be entered into your electronic medical record.  These signatures attest to the fact that that the information above on your After Visit Summary has been reviewed and is understood.  Full responsibility of the confidentiality of this discharge information lies with you and/or your care-partner.I found and removed 2 tiny polyps today. Both look benign.  I will let you know pathology results and when to have another routine colonoscopy by mail and/or My Chart.  Hemorrhoids are seen but not large. You may follow-up in office if  you desire repeat banding.   You also have a condition called diverticulosis - common and not usually a problem. Please read the handout provided.  Please resume typical diet and medications.  I appreciate the opportunity to care for you. Gatha Mayer, MD, [facility]

## 2022-07-17 ENCOUNTER — Telehealth: Payer: Self-pay

## 2022-07-17 NOTE — Telephone Encounter (Signed)
  Follow up Call-     07/16/2022    9:06 AM  Call back number  Post procedure Call Back phone  # [phone]  Permission to leave phone message Yes     Patient questions:  Do you have a fever, pain , or abdominal swelling? No. Pain Score  0 *  Have you tolerated food without any problems? Yes.    Have you been able to return to your normal activities? Yes.    Do you have any questions about your discharge instructions: Diet   No. Medications  No. Follow up visit  No.  Do you have questions or concerns about your Care? No.  Actions: * If pain score is 4 or above: No action needed, pain <4.

## 2022-07-22 ENCOUNTER — Encounter: Payer: Self-pay | Admitting: Internal Medicine

## 2022-07-22 DIAGNOSIS — D126 Benign neoplasm of colon, unspecified: Secondary | ICD-10-CM | POA: Insufficient documentation

## 2022-08-01 ENCOUNTER — Other Ambulatory Visit: Payer: Self-pay | Admitting: Endocrinology

## 2022-08-18 ENCOUNTER — Other Ambulatory Visit: Payer: 59

## 2022-08-27 ENCOUNTER — Other Ambulatory Visit: Payer: Self-pay

## 2022-08-27 ENCOUNTER — Other Ambulatory Visit: Payer: Self-pay | Admitting: Endocrinology

## 2022-08-27 ENCOUNTER — Telehealth: Payer: Self-pay

## 2022-08-27 DIAGNOSIS — E1165 Type 2 diabetes mellitus with hyperglycemia: Secondary | ICD-10-CM

## 2022-08-27 MED ORDER — JATENZO 158 MG PO CAPS: 1.0000 | ORAL_CAPSULE | Freq: Two times a day (BID) | ORAL | 2 refills | Status: DC

## 2022-08-27 MED ORDER — OZEMPIC (1 MG/DOSE) 4 MG/3ML ~~LOC~~ SOPN: PEN_INJECTOR | SUBCUTANEOUS | 2 refills | Status: DC

## 2022-08-27 NOTE — Telephone Encounter (Signed)
Patient called in needs jatenzo sent in to [facility] 90 day Rx

## 2022-09-23 ENCOUNTER — Other Ambulatory Visit: Payer: Self-pay

## 2022-09-23 DIAGNOSIS — E1165 Type 2 diabetes mellitus with hyperglycemia: Secondary | ICD-10-CM

## 2022-09-23 DIAGNOSIS — E782 Mixed hyperlipidemia: Secondary | ICD-10-CM

## 2022-09-23 DIAGNOSIS — I10 Essential (primary) hypertension: Secondary | ICD-10-CM

## 2022-09-23 MED ORDER — LOSARTAN POTASSIUM 50 MG PO TABS: 50.0000 mg | ORAL_TABLET | Freq: Every day | ORAL | 1 refills | Status: DC

## 2022-09-23 MED ORDER — METFORMIN HCL 1000 MG PO TABS: 1000.0000 mg | ORAL_TABLET | Freq: Two times a day (BID) | ORAL | 1 refills | Status: DC

## 2022-09-23 MED ORDER — ATORVASTATIN CALCIUM 10 MG PO TABS: 10.0000 mg | ORAL_TABLET | Freq: Every day | ORAL | 0 refills | Status: DC

## 2022-09-27 ENCOUNTER — Other Ambulatory Visit: Payer: Self-pay | Admitting: Endocrinology

## 2022-10-15 ENCOUNTER — Other Ambulatory Visit: Payer: 59

## 2022-10-16 ENCOUNTER — Other Ambulatory Visit: Payer: Self-pay | Admitting: Endocrinology

## 2022-10-20 ENCOUNTER — Ambulatory Visit: Payer: 59 | Admitting: Endocrinology

## 2022-11-04 ENCOUNTER — Other Ambulatory Visit: Payer: Self-pay | Admitting: Endocrinology

## 2022-11-04 ENCOUNTER — Other Ambulatory Visit (HOSPITAL_BASED_OUTPATIENT_CLINIC_OR_DEPARTMENT_OTHER): Payer: Self-pay

## 2022-11-04 ENCOUNTER — Other Ambulatory Visit (HOSPITAL_COMMUNITY): Payer: Self-pay

## 2022-11-04 DIAGNOSIS — E1165 Type 2 diabetes mellitus with hyperglycemia: Secondary | ICD-10-CM

## 2022-11-04 MED ORDER — OZEMPIC (1 MG/DOSE) 4 MG/3ML ~~LOC~~ SOPN
1.0000 mg | PEN_INJECTOR | SUBCUTANEOUS | 1 refills | Status: DC
  Filled 2022-11-04: qty 3, 28d supply, fill #0
  Filled 2022-12-01: qty 3, 28d supply, fill #1
  Filled 2022-12-31 (×2): qty 3, 28d supply, fill #2
  Filled 2023-02-05: qty 3, 28d supply, fill #3
  Filled 2023-03-18: qty 3, 28d supply, fill #4
  Filled 2023-04-22: qty 3, 28d supply, fill #5

## 2022-11-17 ENCOUNTER — Other Ambulatory Visit (INDEPENDENT_AMBULATORY_CARE_PROVIDER_SITE_OTHER): Payer: 59

## 2022-11-17 ENCOUNTER — Other Ambulatory Visit: Payer: Self-pay | Admitting: Endocrinology

## 2022-11-17 DIAGNOSIS — E230 Hypopituitarism: Secondary | ICD-10-CM

## 2022-11-17 DIAGNOSIS — E782 Mixed hyperlipidemia: Secondary | ICD-10-CM

## 2022-11-17 DIAGNOSIS — E1165 Type 2 diabetes mellitus with hyperglycemia: Secondary | ICD-10-CM

## 2022-11-17 LAB — TESTOSTERONE: Testosterone: 400.08 ng/dL (ref 300.00–890.00)

## 2022-11-18 ENCOUNTER — Other Ambulatory Visit: Payer: Self-pay | Admitting: Endocrinology

## 2022-11-18 DIAGNOSIS — E782 Mixed hyperlipidemia: Secondary | ICD-10-CM

## 2022-11-19 ENCOUNTER — Other Ambulatory Visit (INDEPENDENT_AMBULATORY_CARE_PROVIDER_SITE_OTHER): Payer: 59

## 2022-11-19 DIAGNOSIS — E1165 Type 2 diabetes mellitus with hyperglycemia: Secondary | ICD-10-CM | POA: Diagnosis not present

## 2022-11-19 DIAGNOSIS — E782 Mixed hyperlipidemia: Secondary | ICD-10-CM | POA: Diagnosis not present

## 2022-11-19 LAB — LIPID PANEL
Cholesterol: 98 mg/dL (ref 0–200)
HDL: 25 mg/dL — ABNORMAL LOW (ref >39.00)
LDL Cholesterol: 46 mg/dL (ref 0–99)
NonHDL: 72.64
Total CHOL/HDL Ratio: 4
Triglycerides: 132 mg/dL (ref 0.0–149.0)
VLDL: 26.4 mg/dL (ref 0.0–40.0)

## 2022-11-19 LAB — COMPREHENSIVE METABOLIC PANEL
ALT: 20 U/L (ref 0–53)
AST: 15 U/L (ref 0–37)
Albumin: 4.4 g/dL (ref 3.5–5.2)
Alkaline Phosphatase: 58 U/L (ref 39–117)
BUN: 12 mg/dL (ref 6–23)
CO2: 29 mEq/L (ref 19–32)
Calcium: 9.4 mg/dL (ref 8.4–10.5)
Chloride: 103 mEq/L (ref 96–112)
Creatinine, Ser: 0.95 mg/dL (ref 0.40–1.50)
GFR: 85.12 mL/min (ref >60.00)
Glucose, Bld: 119 mg/dL — ABNORMAL HIGH (ref 70–99)
Potassium: 3.9 mEq/L (ref 3.5–5.1)
Sodium: 139 mEq/L (ref 135–145)
Total Bilirubin: 0.5 mg/dL (ref 0.2–1.2)
Total Protein: 7.2 g/dL (ref 6.0–8.3)

## 2022-11-19 LAB — CBC
HCT: 44.6 % (ref 39.0–52.0)
Hemoglobin: 14.9 g/dL (ref 13.0–17.0)
MCHC: 33.4 g/dL (ref 30.0–36.0)
MCV: 83.4 fl (ref 78.0–100.0)
Platelets: 246 10*3/uL (ref 150.0–400.0)
RBC: 5.35 Mil/uL (ref 4.22–5.81)
RDW: 14.6 % (ref 11.5–15.5)
WBC: 7.8 10*3/uL (ref 4.0–10.5)

## 2022-11-19 LAB — HEMOGLOBIN A1C: Hgb A1c MFr Bld: 8.9 % — ABNORMAL HIGH (ref 4.6–6.5)

## 2022-11-24 ENCOUNTER — Other Ambulatory Visit (HOSPITAL_BASED_OUTPATIENT_CLINIC_OR_DEPARTMENT_OTHER): Payer: Self-pay

## 2022-11-25 ENCOUNTER — Other Ambulatory Visit: Payer: Self-pay | Admitting: *Deleted

## 2022-11-25 ENCOUNTER — Encounter: Payer: Self-pay | Admitting: Endocrinology

## 2022-11-25 ENCOUNTER — Other Ambulatory Visit: Payer: Self-pay

## 2022-11-25 ENCOUNTER — Ambulatory Visit: Payer: 59 | Admitting: Endocrinology

## 2022-11-25 VITALS — BP 144/80 | HR 74 | Ht 70.0 in | Wt 202.0 lb

## 2022-11-25 DIAGNOSIS — E1165 Type 2 diabetes mellitus with hyperglycemia: Secondary | ICD-10-CM

## 2022-11-25 DIAGNOSIS — E782 Mixed hyperlipidemia: Secondary | ICD-10-CM | POA: Diagnosis not present

## 2022-11-25 DIAGNOSIS — I10 Essential (primary) hypertension: Secondary | ICD-10-CM | POA: Diagnosis not present

## 2022-11-25 DIAGNOSIS — E230 Hypopituitarism: Secondary | ICD-10-CM | POA: Diagnosis not present

## 2022-11-25 MED ORDER — GLUCOSE BLOOD VI STRP: ORAL_STRIP | 12 refills | Status: DC

## 2022-11-25 MED ORDER — ONETOUCH VERIO W/DEVICE KIT: PACK | 0 refills | Status: DC

## 2022-11-25 NOTE — Progress Notes (Signed)
Patient ID: Douglas [name], male   DOB: 02/27/1961, 62 y.o.   MRN: [id number]           Chief complaint: Endocrinology follow-up    History of Present Illness  PROBLEM 1:  Hypogonadism: This was diagnosed in 2009  He was apparently initially seen by his psychiatrist and was having difficulties with fatigue, agoraphobia and decreased libido as well as sexual dysfunction. He was told that his testosterone level was significantly low and was given testosterone supplements probably with AndroGel Subsequently the patient has been treated by various physicians including the urologist with testosterone supplements, mostly AndroGel He thinks that his testosterone levels have never been normal with AndroGel although he thinks that he felt a little better with taking this from his urologist who called him to apply it on his abdomen  He was also tried on monthly injections for about 3 months without consistent benefit also In 2016 he was given [place] again without any benefit   RECENT history: He had baseline complaints of fatigue, decreased motivation, decreased libido and erectile dysfunction from hypogonadism  Initial evaluation showed low free testosterone of 4.5 and normal LH  He had been on clomiphene 25 mg daily after his initial consultation in 09/2015  Because of continued fatigue, decreased libido and testosterone levels being on the low end of normal he was switched to ANDROGEL 3 pumps daily in 11/2017 He was having difficulty applying this in the mornings because of not having time before going to work With this his testosterone levels could be consistently low  He was switched to Androderm, 8 mg daily in October 2020 With the treatment he had improved energy level and less weakness; however because of skin irritation this was discontinued He was given a prescription for Natesto but he tends to have nasal congestion and blockage with this and this was stopped  He is now  taking JATENZO after prior authorization He is taking it with breakfast and dinner fairly consistently With taking it more regularly now his testosterone level has come back to normal at 400 Labs were done 6 hours after his breakfast  As usual he feels fairly good overall   His lab results as follows:  Lab Results  Component Value Date   TESTOSTERONE 400.08 11/17/2022   TESTOSTERONE 261.53 (L) 06/17/2022   TESTOSTERONE 397.00 01/01/2022   TESTOSTERONE 217.43 (L) 10/02/2021     Lab Results  Component Value Date   LH 4.30 11/07/2018   Lab Results  Component Value Date   HGB 14.9 11/19/2022    DIABETES type II: He had significant hyperglycemia at onset with A1c about 13  Since he was not benefiting from Actos and metformin given by his PCP he was given Invokana in addition in August 2018 Since 07/2017 he has been on Ozempic . Non-insulin HYPOGLYCEMIC regimen: Farxiga 10 mg daily, metformin 1000 mg twice daily and Ozempic 1mg  weekly  His A1c has been fluctuating, now 8.9  Current management, problems identified and blood sugar patterns: He did not bring his monitor for download as he says it is not functioning and the Livongo meter he was given is not active His lab fasting glucose late morning was 119 Blood sugars may be higher from not getting Ozempic because of supply issues until recently Weight is about the same recently He is trying to do some stretching and push-up exercises at home along with some walking at work   His last consultation with dietitian was in 2016  Blood  sugars at home: Not available       Wt Readings from Last 3 Encounters:  11/25/22 202 lb (91.6 kg)  07/16/22 201 lb (91.2 kg)  06/18/22 209 lb (94.8 kg)     Lab Results  Component Value Date   HGBA1C 8.9 (H) 11/19/2022   HGBA1C 7.5 (H) 06/17/2022   HGBA1C 7.0 (H) 01/01/2022   Lab Results  Component Value Date   MICROALBUR 1.5 10/02/2021   LDLCALC 46 11/19/2022   CREATININE 0.95  11/19/2022   No other active problems: See review of systems   Allergies as of 11/25/2022       Reactions   Molds & Smuts Other (See Comments)        Medication List        Accurate as of November 25, 2022  4:00 PM. If you have any questions, ask your nurse or doctor.          STOP taking these medications    sertraline 50 MG tablet Commonly known as: ZOLOFT Stopped by: Elayne Snare, MD       TAKE these medications    atorvastatin 10 MG tablet Commonly known as: LIPITOR Take 1 tablet by mouth daily.   Farxiga 10 MG Tabs tablet Generic drug: dapagliflozin propanediol TAKE 1 TABLET BY MOUTH EVERY DAY   fenofibrate 145 MG tablet Commonly known as: TRICOR TAKE 1 TABLET BY MOUTH EVERY DAY   Jatenzo 158 MG Caps Generic drug: Testosterone Undecanoate Take 1 capsule by mouth 2 (two) times daily after a meal.   losartan 50 MG tablet Commonly known as: COZAAR Take 1 tablet (50 mg total) by mouth daily.   metFORMIN 1000 MG tablet Commonly known as: GLUCOPHAGE Take 1 tablet (1,000 mg total) by mouth 2 (two) times daily with a meal.   OneTouch Ultra test strip Generic drug: glucose blood Check blood sugar once a day at at different times   Ozempic (1 MG/DOSE) 4 MG/3ML Sopn Generic drug: Semaglutide (1 MG/DOSE) INJECT 1MG  INTO THE SKIN ONCE A WEEK   Ozempic (1 MG/DOSE) 4 MG/3ML Sopn Generic drug: Semaglutide (1 MG/DOSE) Inject 1 mg into the skin every 7 (seven) days.        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   Adenomatous colon polyp 05/07/06; 05/2012   Repeat colonoscopy 05/17/2012 showed 2 small polyps that were removed--path showed Hyperplastic-not adenomatous.  Still needs repeat 5 yrs.   Angioedema of lips    saw allergist 11/2011   Asthmatic bronchitis  , chronic 01/11/2012   Question occupational asthma with no specific agents identified. He does notice that some areas of his job are particularly dusty and dirty. Currently he is well controlled using Symbicort through an AeroChamber and rarely needing his rescue inhaler. This would be an acceptable long-term status. Elevated nonspecific total IgE and peripheral eosinophilia to suggest an atopic trigger with sensitization to something Consider Daliresp for future use if needed    Chronic fatigue    +daytime somnolence   Chronic recurrent sinusitis 02/28/2012   Depression    Prozac in the past not much help.  Spontaneously resolved.   Diabetes mellitus without complication ([place])    managed by Dr. Dwyane Dee (endo)--this is accurate as of 02/2018, when pt last saw Dr.  Rashanna Christiana.   Diverticulosis    Generalized anxiety disorder 08/11/2012   Hemorrhoids    ext and int   Hyperlipemia, mixed    elev trigs with low HDL. Fish oil OTC.  Statin started in Fall 2018.  Fenofibrate started 07/2018   Hypertension 2017   Hypogonadotropic hypogonadism ([place]) 04/08/2011   As of 2017/18/19, Dr. Dwyane Dee (endo) is following: he started clomiph but no better at f/u 11/2016.  Pt denied any improvemt,though testost improved.  11/2017 Clomiphene d/c'd-topical testo started. His hypogonadism is due to insulin resistance syndrome.  As of 08/2017 endo f/u: pt staying on clomiphene (no testost) and is feeling some improved.  No imp 11/2017 f/u on topical testost (off clom)   Insomnia    Internal hemorrhoids, bleeding and sicharge 05/29/2013   OSA on CPAP    setting changed to 6 cm H20 (fixed) 10/2016 by pulm.   Rotator cuff tear 2011   [facility]: conservative mgmt--symptoms stable as of 04/2011.    Past Surgical History:  Procedure Laterality Date   adnoids     CARDIOVASCULAR STRESS TEST     Normal stress nuclear study, EF normal. +Hypertensive bp response--subsequent 24H ambulatory BP monitoring confirmed HTN.   COLONOSCOPY   05/2006, 05/2012   HEMORRHOID BANDING     POLYPECTOMY     Adenomatous 2007; hyperplastic 2013.  Repeat 5 yrs.   TRANSTHORACIC ECHOCARDIOGRAM  01/30/16   Normal except mild aortic dilatation   WISDOM TOOTH EXTRACTION      Family History  Problem Relation Age of Onset   Hypertension Mother    Diabetes Mother    CVA Mother    Hypertension Father    Diabetes Father    Dementia Father    Heart disease Father    CAD Father    Colon cancer Neg Hx    Stomach cancer Neg Hx    Rectal cancer Neg Hx     Social History:  reports that he has never smoked. He has never used smokeless tobacco. He reports current alcohol use. He reports that he does not use drugs.  Review of Systems  HYPERLIPIDEMIA: Has mixed hyperlipidemia  He has been on fenofibrate and not taking regularly as he ran out Previously on Lipitor 40 mg and now back on 10 mg   LDL is excellent and triglycerides are now back to normal This is despite not taking any fenofibrate lately He thinks his diet is much better with avoiding fast foods such as [employer]   Lab Results  Component Value Date   CHOL 98 11/19/2022   CHOL 127 06/17/2022   CHOL 131 10/02/2021   Lab Results  Component Value Date   HDL 25.00 (L) 11/19/2022   HDL 24.50 (L) 06/17/2022   HDL 32.90 (L) 10/02/2021   Lab Results  Component Value Date   LDLCALC 46 11/19/2022   LDLCALC 60 10/02/2021   LDLCALC 70 04/08/2021    Lab Results  Component Value Date   TRIG 132.0 11/19/2022   TRIG 330.0 (H) 06/17/2022   TRIG 188.0 (H) 10/02/2021   Lab Results  Component Value Date   CHOLHDL 4 11/19/2022   CHOLHDL 5 06/17/2022   CHOLHDL 4 10/02/2021   Lab Results  Component Value Date   LDLDIRECT 64.0 06/17/2022   LDLDIRECT 76.0 07/03/2021   LDLDIRECT 101.0 01/02/2021    HYPERTENSION:     He is on losartan 50 mg daily  Blood pressure is monitored at home: Blood pressure readings [id number] systolic at home,  higher today because of being  stressed  BP Readings from Last 3 Encounters:  11/25/22 (!) 144/80  07/16/22 129/73  06/18/22 (!) 142/70   Creatinine history:  Lab Results  Component Value Date   CREATININE 0.95 11/19/2022   CREATININE 0.93 06/17/2022   CREATININE 1.13 01/01/2022    General Examination:   BP (!) 144/80   Pulse 74   Ht 5\' 10"  (1.778 m)   Wt 202 lb (91.6 kg)   SpO2 99%   BMI 28.98 kg/m    Assessment/ Plan:    Hypogonadotropic hypogonadism, long-standing   He has been on JATENZO 158 mg twice a day and is taking this regularly now With this he is subjectively doing well and his testosterone level is 400 He will continue the same dose  HYPERLIPIDEMIA: Has mixed hyperlipidemia with much better triglycerides, this may be related to cutting out fast food HDL however still low His LDL is controlled with Lipitor 10 mg and he can leave off his fenofibrate  DIABETES type 2:  A1c is much higher at 8.9 again from not getting his Ozempic regularly until recently He does state [place] and metformin also Unless his A1c is high on the next visit we will continue 1 mg dose  Again discussed the importance of monitoring his blood sugars He will need a new glucose monitor To start more aerobic exercise also  HYPERTENSION: Blood pressure is higher today and likely has whitecoat syndrome He will continue the same regimen     Patient Instructions  Leave off Fenofibrate    Elayne Snare 11/25/2022, 4:00 PM

## 2022-11-25 NOTE — Patient Instructions (Signed)
Leave off Fenofibrate

## 2022-12-11 ENCOUNTER — Other Ambulatory Visit: Payer: Self-pay

## 2022-12-11 ENCOUNTER — Other Ambulatory Visit: Payer: Self-pay | Admitting: Endocrinology

## 2022-12-11 NOTE — Addendum Note (Signed)
Addended by: Shelby Dubin on: 12/11/2022 02:58 PM   Modules accepted: Orders

## 2022-12-11 NOTE — Addendum Note (Signed)
Addended by: Shelby Dubin on: 12/11/2022 02:59 PM   Modules accepted: Orders

## 2022-12-14 ENCOUNTER — Encounter: Payer: Self-pay | Admitting: *Deleted

## 2022-12-18 ENCOUNTER — Other Ambulatory Visit: Payer: Self-pay | Admitting: Endocrinology

## 2022-12-18 ENCOUNTER — Telehealth: Payer: Self-pay | Admitting: Endocrinology

## 2022-12-18 MED ORDER — JATENZO 158 MG PO CAPS: ORAL_CAPSULE | ORAL | 3 refills | Status: DC

## 2022-12-18 MED ORDER — ONETOUCH ULTRA VI STRP: ORAL_STRIP | 12 refills | Status: DC

## 2022-12-18 NOTE — Telephone Encounter (Signed)
Patient would like [facility] it was denied through [employer]

## 2022-12-18 NOTE — Telephone Encounter (Signed)
MEDICATION: 1) Jatenzo JATENZO 158 MG CAPS  2) OneTouch Ultra glucose blood (ONETOUCH ULTRA) test strip  PHARMACY:    [id number] [place], [place] - [address] (Ph: [phone])    HAS THE PATIENT CONTACTED THEIR PHARMACY?  Yes  IS THIS A 90 DAY SUPPLY : Yes  IS PATIENT OUT OF MEDICATION: Not sure  IF NOT; HOW MUCH IS LEFT:   LAST APPOINTMENT DATE: /27/24  NEXT APPOINTMENT DATE:@6 /27/2024  DO WE HAVE YOUR PERMISSION TO LEAVE A DETAILED MESSAGE?: Yes  OTHER COMMENTS: Patient used [facility] for [facility], but says that insurance denied, so wants to try   [id number] - [place], [place] - [address] (Ph: [phone])     **Let patient know to contact pharmacy at the end of the day to make sure medication is ready. **  ** Please notify patient to allow 48-72 hours to process**  **Encourage patient to contact the pharmacy for refills or they can request refills through [facility]**

## 2022-12-25 ENCOUNTER — Other Ambulatory Visit: Payer: Self-pay | Admitting: Endocrinology

## 2022-12-25 ENCOUNTER — Telehealth: Payer: Self-pay

## 2022-12-25 NOTE — Telephone Encounter (Signed)
[facility] and let us know if he has any other issues

## 2022-12-25 NOTE — Telephone Encounter (Signed)
Patient called and LVM today stating [employer] will not cover the [facility] and asked to send that to [facility] instead.

## 2022-12-31 ENCOUNTER — Other Ambulatory Visit (HOSPITAL_BASED_OUTPATIENT_CLINIC_OR_DEPARTMENT_OTHER): Payer: Self-pay

## 2023-01-01 ENCOUNTER — Other Ambulatory Visit (HOSPITAL_BASED_OUTPATIENT_CLINIC_OR_DEPARTMENT_OTHER): Payer: Self-pay

## 2023-01-29 ENCOUNTER — Other Ambulatory Visit (HOSPITAL_BASED_OUTPATIENT_CLINIC_OR_DEPARTMENT_OTHER): Payer: Self-pay

## 2023-02-01 ENCOUNTER — Other Ambulatory Visit: Payer: Self-pay | Admitting: Endocrinology

## 2023-02-01 DIAGNOSIS — E782 Mixed hyperlipidemia: Secondary | ICD-10-CM

## 2023-02-01 DIAGNOSIS — E1165 Type 2 diabetes mellitus with hyperglycemia: Secondary | ICD-10-CM

## 2023-02-10 ENCOUNTER — Other Ambulatory Visit: Payer: Self-pay | Admitting: Endocrinology

## 2023-02-18 ENCOUNTER — Other Ambulatory Visit: Payer: 59

## 2023-02-25 ENCOUNTER — Ambulatory Visit: Payer: 59 | Admitting: Endocrinology

## 2023-03-17 ENCOUNTER — Other Ambulatory Visit: Payer: 59

## 2023-03-17 DIAGNOSIS — I10 Essential (primary) hypertension: Secondary | ICD-10-CM | POA: Diagnosis not present

## 2023-03-17 DIAGNOSIS — E230 Hypopituitarism: Secondary | ICD-10-CM

## 2023-03-17 DIAGNOSIS — E1165 Type 2 diabetes mellitus with hyperglycemia: Secondary | ICD-10-CM | POA: Diagnosis not present

## 2023-03-17 DIAGNOSIS — E782 Mixed hyperlipidemia: Secondary | ICD-10-CM

## 2023-03-17 LAB — COMPREHENSIVE METABOLIC PANEL
ALT: 18 U/L (ref 0–53)
AST: 14 U/L (ref 0–37)
Albumin: 4.6 g/dL (ref 3.5–5.2)
Alkaline Phosphatase: 59 U/L (ref 39–117)
BUN: 15 mg/dL (ref 6–23)
CO2: 25 mEq/L (ref 19–32)
Calcium: 10.3 mg/dL (ref 8.4–10.5)
Chloride: 102 mEq/L (ref 96–112)
Creatinine, Ser: 0.9 mg/dL (ref 0.40–1.50)
GFR: 90.62 mL/min (ref >60.00)
Glucose, Bld: 114 mg/dL — ABNORMAL HIGH (ref 70–99)
Potassium: 4.2 mEq/L (ref 3.5–5.1)
Sodium: 138 mEq/L (ref 135–145)
Total Bilirubin: 0.6 mg/dL (ref 0.2–1.2)
Total Protein: 7.5 g/dL (ref 6.0–8.3)

## 2023-03-17 LAB — HEMOGLOBIN A1C: Hgb A1c MFr Bld: 7.4 % — ABNORMAL HIGH (ref 4.6–6.5)

## 2023-03-17 LAB — LIPID PANEL
Cholesterol: 133 mg/dL (ref 0–200)
HDL: 30.4 mg/dL — ABNORMAL LOW (ref >39.00)
NonHDL: 102.88
Total CHOL/HDL Ratio: 4
Triglycerides: 328 mg/dL — ABNORMAL HIGH (ref 0.0–149.0)
VLDL: 65.6 mg/dL — ABNORMAL HIGH (ref 0.0–40.0)

## 2023-03-17 LAB — LDL CHOLESTEROL, DIRECT: Direct LDL: 67 mg/dL

## 2023-03-19 ENCOUNTER — Ambulatory Visit: Payer: 59 | Admitting: Endocrinology

## 2023-03-19 ENCOUNTER — Other Ambulatory Visit (HOSPITAL_BASED_OUTPATIENT_CLINIC_OR_DEPARTMENT_OTHER): Payer: Self-pay

## 2023-03-19 ENCOUNTER — Encounter: Payer: Self-pay | Admitting: Endocrinology

## 2023-03-19 VITALS — BP 142/99 | HR 78 | Ht 70.0 in | Wt 206.0 lb

## 2023-03-19 DIAGNOSIS — Z7984 Long term (current) use of oral hypoglycemic drugs: Secondary | ICD-10-CM

## 2023-03-19 DIAGNOSIS — E782 Mixed hyperlipidemia: Secondary | ICD-10-CM | POA: Diagnosis not present

## 2023-03-19 DIAGNOSIS — Z7985 Long-term (current) use of injectable non-insulin antidiabetic drugs: Secondary | ICD-10-CM | POA: Diagnosis not present

## 2023-03-19 DIAGNOSIS — E1165 Type 2 diabetes mellitus with hyperglycemia: Secondary | ICD-10-CM

## 2023-03-19 DIAGNOSIS — I10 Essential (primary) hypertension: Secondary | ICD-10-CM

## 2023-03-19 DIAGNOSIS — E230 Hypopituitarism: Secondary | ICD-10-CM

## 2023-03-19 LAB — POCT GLUCOSE (DEVICE FOR HOME USE): POC Glucose: 188 mg/dl — AB (ref 70–99)

## 2023-03-19 MED ORDER — FENOFIBRATE 145 MG PO TABS
145.0000 mg | ORAL_TABLET | Freq: Every day | ORAL | 0 refills | Status: DC
  Filled 2023-03-19: qty 30, 30d supply, fill #0
  Filled 2023-04-22: qty 30, 30d supply, fill #1
  Filled 2023-05-27: qty 30, 30d supply, fill #2

## 2023-03-19 MED ORDER — EMPAGLIFLOZIN 25 MG PO TABS
25.0000 mg | ORAL_TABLET | Freq: Every day | ORAL | 2 refills | Status: DC
Start: 2023-03-19 — End: 2024-01-05
  Filled 2023-03-19: qty 30, 30d supply, fill #0
  Filled 2023-04-22: qty 30, 30d supply, fill #1
  Filled 2023-05-27: qty 30, 30d supply, fill #2
  Filled 2023-07-01: qty 30, 30d supply, fill #3
  Filled 2023-08-03: qty 30, 30d supply, fill #4
  Filled 2023-09-01: qty 30, 30d supply, fill #5
  Filled 2023-10-07: qty 30, 30d supply, fill #6
  Filled 2023-11-02: qty 30, 30d supply, fill #7
  Filled 2023-12-01: qty 30, 30d supply, fill #8

## 2023-03-19 MED ORDER — LOSARTAN POTASSIUM 50 MG PO TABS
50.0000 mg | ORAL_TABLET | Freq: Every day | ORAL | 1 refills | Status: DC
Start: 2023-03-19 — End: 2023-04-06
  Filled 2023-03-19: qty 30, 30d supply, fill #0

## 2023-03-19 NOTE — Progress Notes (Addendum)
Patient ID: Douglas [name], male   DOB: Mar 28, 1961, 62 y.o.   MRN: [id number]           Chief complaint: Endocrinology follow-up    History of Present Illness  PROBLEM 1:  Hypogonadism: This was diagnosed in 2009  He was apparently initially seen by his psychiatrist and was having difficulties with fatigue, agoraphobia and decreased libido as well as sexual dysfunction. He was told that his testosterone level was significantly low and was given testosterone supplements probably with AndroGel Subsequently the patient has been treated by various physicians including the urologist with testosterone supplements, mostly AndroGel He thinks that his testosterone levels have never been normal with AndroGel although he thinks that he felt a little better with taking this from his urologist who called him to apply it on his abdomen  He was also tried on monthly injections for about 3 months without consistent benefit also In 2016 he was given [place] again without any benefit  RECENT history: He had baseline complaints of fatigue, decreased motivation, decreased libido and erectile dysfunction from hypogonadism  Initial evaluation showed low free testosterone of 4.5 and normal LH  He had been on clomiphene 25 mg daily after his initial consultation in 09/2015  Because of continued fatigue, decreased libido and testosterone levels being on the low end of normal he was switched to ANDROGEL 3 pumps daily in 11/2017 He was having difficulty applying this in the mornings because of not having time before going to work With this his testosterone levels could be consistently low  He was switched to Androderm, 8 mg daily in October 2020 With the treatment he had improved energy level and less weakness; however because of skin irritation this was discontinued He was given a prescription for Natesto but he tends to have nasal congestion and blockage with this and this was stopped  He is taking  JATENZO 158 mg twice a day after prior authorization He is taking it with breakfast and dinner fairly consistently With taking it more regularly and on the last visit testosterone level has come back to normal at 400 Labs were done 6 hours after his breakfast  As usual he has no fatigue   His lab results as follows:  Lab Results  Component Value Date   TESTOSTERONE 400.08 11/17/2022   TESTOSTERONE 261.53 (L) 06/17/2022   TESTOSTERONE 397.00 01/01/2022   TESTOSTERONE 217.43 (L) 10/02/2021     Lab Results  Component Value Date   LH 4.30 11/07/2018   Lab Results  Component Value Date   HGB 14.9 11/19/2022    DIABETES type II: He had significant hyperglycemia at onset with A1c about 13  Since he was not benefiting from Actos and metformin given by his PCP he was given Invokana in addition in August 2018 Since 07/2017 he has been on Ozempic . Non-insulin HYPOGLYCEMIC regimen: Farxiga 10 mg daily, metformin 1000 mg twice daily and Ozempic 1mg  weekly  His A1c has been fluctuating, now 7.4 compared to 8.9  Current management, problems identified and blood sugar patterns: He did not bring his monitor for download again Appears that for some reason he is not taking his Marcelline Deist for some time However has been able to continue his Ozempic He says his blood sugars will be sometimes higher after meals and as high as 210 He says it is not functioning and the Livongo meter he was given is not active His lab fasting glucose late morning was 114 late morning He says he  is fairly active with various tasks at work His weight is gradually going up   His last consultation with dietitian was in 2016  Blood sugars at home: As above, download not available       Wt Readings from Last 3 Encounters:  03/19/23 206 lb (93.4 kg)  11/25/22 202 lb (91.6 kg)  07/16/22 201 lb (91.2 kg)     Lab Results  Component Value Date   HGBA1C 7.4 (H) 03/17/2023   HGBA1C 8.9 (H) 11/19/2022   HGBA1C 7.5  (H) 06/17/2022   Lab Results  Component Value Date   MICROALBUR 1.5 10/02/2021   LDLCALC 46 11/19/2022   CREATININE 0.90 03/17/2023   No other active problems: See review of systems   Allergies as of 03/19/2023       Reactions   Molds & Smuts Other (See Comments)        Medication List        Accurate as of March 19, 2023  8:53 AM. If you have any questions, ask your nurse or doctor.          STOP taking these medications    fenofibrate 145 MG tablet Commonly known as: TRICOR Stopped by: Reather Littler       TAKE these medications    atorvastatin 10 MG tablet Commonly known as: LIPITOR Take 1 tablet by mouth daily.   Farxiga 10 MG Tabs tablet Generic drug: dapagliflozin propanediol TAKE 1 TABLET BY MOUTH EVERY DAY   glucose blood test strip Check sugar once daily.   OneTouch Ultra test strip Generic drug: glucose blood Check blood sugar once a day at at different times   Jatenzo 158 MG Caps Generic drug: Testosterone Undecanoate Take 1 capsule by mouth twice daily after meals.   losartan 50 MG tablet Commonly known as: COZAAR Take 1 tablet (50 mg total) by mouth daily.   metFORMIN 1000 MG tablet Commonly known as: GLUCOPHAGE Take 1 tablet by mouth twice daily with meals.   OneTouch Verio w/Device Kit Check glucose once daily.   Ozempic (1 MG/DOSE) 4 MG/3ML Sopn Generic drug: Semaglutide (1 MG/DOSE) INJECT 1MG  INTO THE SKIN ONCE A WEEK   Ozempic (1 MG/DOSE) 4 MG/3ML Sopn Generic drug: Semaglutide (1 MG/DOSE) Inject 1 mg into the skin every 7 (seven) days.        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   Adenomatous colon polyp 05/07/06; 05/2012   Repeat colonoscopy 05/17/2012 showed 2 small polyps that were removed--path showed Hyperplastic-not  adenomatous.  Still needs repeat 5 yrs.   Angioedema of lips    saw allergist 11/2011   Asthmatic bronchitis , chronic 01/11/2012   Question occupational asthma with no specific agents identified. He does notice that some areas of his job are particularly dusty and dirty. Currently he is well controlled using Symbicort through an AeroChamber and rarely needing his rescue inhaler. This would be an acceptable long-term status. Elevated nonspecific total IgE and peripheral eosinophilia to suggest an atopic trigger with sensitization to something Consider Daliresp for future use if needed    Chronic fatigue    +daytime somnolence   Chronic recurrent sinusitis 02/28/2012   Depression    Prozac in the past not much help.  Spontaneously resolved.   Diabetes  mellitus without complication (HCC)    managed by Dr. Lucianne Muss ([facility])--this is accurate as of 02/2018, when pt last saw Dr. Lucianne Muss.   Diverticulosis    Generalized anxiety disorder 08/11/2012   Hemorrhoids    ext and int   Hyperlipemia, mixed    elev trigs with low HDL. Fish oil OTC.  Statin started in Fall 2018.  Fenofibrate started 07/2018   Hypertension 2017   Hypogonadotropic hypogonadism (HCC) 04/08/2011   As of 2017/18/19, Dr. Lucianne Muss ([facility]) is following: he started clomiph but no better at f/u 11/2016.  Pt denied any improvemt,though testost improved.  11/2017 Clomiphene d/c'd-topical testo started. His hypogonadism is due to insulin resistance syndrome.  As of 08/2017 endo f/u: pt staying on clomiphene (no testost) and is feeling some improved.  No imp 11/2017 f/u on topical testost (off clom)   Insomnia    Internal hemorrhoids, bleeding and sicharge 05/29/2013   OSA on CPAP    setting changed to 6 cm H20 (fixed) 10/2016 by pulm.   Rotator cuff tear 2011   [facility]: conservative mgmt--symptoms stable as of 04/2011.    Past Surgical History:  Procedure Laterality Date   adnoids     CARDIOVASCULAR STRESS TEST     Normal stress nuclear study,  EF normal. +Hypertensive bp response--subsequent 24H ambulatory BP monitoring confirmed HTN.   COLONOSCOPY  05/2006, 05/2012   HEMORRHOID BANDING     POLYPECTOMY     Adenomatous 2007; hyperplastic 2013.  Repeat 5 yrs.   TRANSTHORACIC ECHOCARDIOGRAM  01/30/16   Normal except mild aortic dilatation   WISDOM TOOTH EXTRACTION      Family History  Problem Relation Age of Onset   Hypertension Mother    Diabetes Mother    CVA Mother    Hypertension Father    Diabetes Father    Dementia Father    Heart disease Father    CAD Father    Colon cancer Neg Hx    Stomach cancer Neg Hx    Rectal cancer Neg Hx     Social History:  reports that he has never smoked. He has never used smokeless tobacco. He reports current alcohol use. He reports that he does not use drugs.  Review of Systems  HYPERLIPIDEMIA: Has mixed hyperlipidemia  He has been on fenofibrate and not taking regularly as he ran out Is on Lipitor 10 mg   LDL is excellent and triglycerides are now over 300 from not getting his fenofibrate since triglycerides were normal previously He thinks he is generally watching his diet but does still sometimes have fast food and fried food    Lab Results  Component Value Date   CHOL 133 03/17/2023   CHOL 98 11/19/2022   CHOL 127 06/17/2022   Lab Results  Component Value Date   HDL 30.40 (L) 03/17/2023   HDL 25.00 (L) 11/19/2022   HDL 24.50 (L) 06/17/2022   Lab Results  Component Value Date   LDLCALC 46 11/19/2022   LDLCALC 60 10/02/2021   LDLCALC 70 04/08/2021    Lab Results  Component Value Date   TRIG 328.0 (H) 03/17/2023   TRIG 132.0 11/19/2022   TRIG 330.0 (H) 06/17/2022   Lab Results  Component Value Date   CHOLHDL 4 03/17/2023   CHOLHDL 4 11/19/2022   CHOLHDL 5 06/17/2022   Lab Results  Component Value Date   LDLDIRECT 67.0 03/17/2023   LDLDIRECT 64.0 06/17/2022   LDLDIRECT 76.0 07/03/2021    HYPERTENSION:  He is on losartan 50 mg daily  Blood  pressure is monitored at home: Blood pressure readings are about 130 but rarely is above 150 systolic at home, higher today because of being out of losartan  BP Readings from Last 3 Encounters:  03/19/23 (!) 142/99  11/25/22 (!) 144/80  07/16/22 129/73   Creatinine history:  Lab Results  Component Value Date   CREATININE 0.90 03/17/2023   CREATININE 0.95 11/19/2022   CREATININE 0.93 06/17/2022    General Examination:   BP (!) 142/99 (BP Location: Left Arm, Patient Position: Sitting, Cuff Size: Small)   Pulse 78   Ht 5\' 10"  (1.778 m)   Wt 206 lb (93.4 kg)   SpO2 98%   BMI 29.56 kg/m    Diabetic Foot Exam - Simple   Simple Foot Form Diabetic Foot exam was performed with the following findings: Yes   Visual Inspection No deformities, no ulcerations, no other skin breakdown bilaterally: Yes Sensation Testing Intact to touch and monofilament testing bilaterally: Yes Pulse Check Posterior Tibialis and Dorsalis pulse intact bilaterally: Yes Comments     Assessment/ Plan:    Hypogonadotropic hypogonadism, long-standing   He has been on JATENZO 158 mg twice a day and is taking this regularly  With this he is subjectively doing well and his testosterone level was recently 400 He will continue the same dose and have his labs checked on the next visit  HYPERLIPIDEMIA: Has mixed hyperlipidemia  HDL still low His LDL is controlled with Lipitor 10 mg but since triglycerides are significantly high he will need to go back on p.o. fibrate  DIABETES type 2:  A1c is better at 7.4 compared to 8.9 Still not as well-controlled from not getting his Marcelline Deist regularly Also he thinks he can do better with his diet Weight is going up  For now we will continue the same regimen except restart SGLT2 drugs He appears now to have no coverage for [place] and will switch to Jardiance 25 mg daily  He thinks he will go back to the meal program that he was doing with the outside company  with prepackaged foods and this will help him with control He does need to bring his monitor for download on each visit and check blood sugars consistently at different times Also continue same dose of Ozempic for now   HYPERTENSION: Blood pressure is higher today from running out of losartan He will continue the same regimen and new prescription was sent, he can now use a local pharmacy instead of [facility] which he is having problems with  Hypogonadism: To have testosterone rechecked on the next visit     There are no Patient Instructions on file for this visit.    Reather Littler 03/19/2023, 8:53 AM

## 2023-03-19 NOTE — Patient Instructions (Addendum)
Lower calorie meals  Check blood sugars on waking up 3 days a week  Also check blood sugars about 2 hours after meals and do this after different meals by rotation  Recommended blood sugar levels on waking up are 90-130 and about 2 hours after meal is 130-160  Please bring your blood sugar monitor to each visit, thank you   Restart Fenofibrate, Losartan and new JARDIANCE

## 2023-04-05 ENCOUNTER — Other Ambulatory Visit: Payer: Self-pay | Admitting: Endocrinology

## 2023-04-05 DIAGNOSIS — I10 Essential (primary) hypertension: Secondary | ICD-10-CM

## 2023-04-07 ENCOUNTER — Other Ambulatory Visit: Payer: Self-pay | Admitting: *Deleted

## 2023-04-07 DIAGNOSIS — M79606 Pain in leg, unspecified: Secondary | ICD-10-CM

## 2023-04-27 ENCOUNTER — Other Ambulatory Visit: Payer: Self-pay | Admitting: Endocrinology

## 2023-04-27 NOTE — Telephone Encounter (Signed)
Pt is requesting a refill. 

## 2023-04-28 ENCOUNTER — Encounter: Payer: Self-pay | Admitting: Vascular Surgery

## 2023-04-28 ENCOUNTER — Ambulatory Visit (HOSPITAL_COMMUNITY)
Admission: RE | Admit: 2023-04-28 | Discharge: 2023-04-28 | Disposition: A | Discharge status: Home / Self Care | Payer: 59 | Source: Ambulatory Visit | Attending: Vascular Surgery | Admitting: Vascular Surgery

## 2023-04-28 ENCOUNTER — Ambulatory Visit: Payer: 59 | Admitting: Vascular Surgery

## 2023-04-28 VITALS — BP 148/87 | HR 75 | Temp 98.0°F | Resp 18 | Ht 70.0 in | Wt 198.8 lb

## 2023-04-28 DIAGNOSIS — M25561 Pain in right knee: Secondary | ICD-10-CM | POA: Diagnosis not present

## 2023-04-28 DIAGNOSIS — M79606 Pain in leg, unspecified: Secondary | ICD-10-CM | POA: Insufficient documentation

## 2023-04-28 LAB — VAS US ABI WITH/WO TBI
Left ABI: 1.24
Right ABI: 1.1

## 2023-04-28 NOTE — Progress Notes (Signed)
Patient ID: Douglas [name], male   DOB: November 19, 1960, 62 y.o.   MRN: [id number]  Reason for Consult: Leg Pain (R>L pain when laying mainly in calfs)   Referred by Ranee Gosselin, MD  Subjective:     HPI:  Douglas [name] is a 62 y.o. male history of right lower extremity pain.  He does not have any history of vascular disease.  He does have hypertension and diabetes but is a lifelong non-smoker.  He does take a statin.  He states the pain is mostly in the middle of the night and cramping in nature particularly of the right calf not usually on the left.  He he walks without limitation.  He does not have any history of stroke, TIA or amaurosis and no personal or family history of aneurysm disease.  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   Adenomatous colon polyp 05/07/06; 05/2012   Repeat colonoscopy 05/17/2012 showed 2 small polyps that were removed--path showed Hyperplastic-not adenomatous.  Still needs repeat 5 yrs.   Angioedema of lips    saw allergist 11/2011   Asthmatic bronchitis , chronic 01/11/2012   Question occupational asthma with no specific agents identified. He does notice that some areas of his job are particularly dusty and dirty. Currently he is well controlled using Symbicort through an AeroChamber and rarely needing his rescue inhaler. This would be an acceptable long-term status. Elevated nonspecific total IgE and peripheral eosinophilia to suggest an atopic trigger with sensitization to something Consider Daliresp for future use if needed    Chronic fatigue    +daytime somnolence   Chronic recurrent sinusitis 02/28/2012   Depression    Prozac in the past not much help.  Spontaneously resolved.   Diabetes mellitus without complication (HCC)    managed by Dr. Lucianne Muss ([facility])--this is accurate as of 02/2018, when pt last saw Dr. Lucianne Muss.   Diverticulosis     Generalized anxiety disorder 08/11/2012   Hemorrhoids    ext and int   Hyperlipemia, mixed    elev trigs with low HDL. Fish oil OTC.  Statin started in Fall 2018.  Fenofibrate started 07/2018   Hypertension 2017   Hypogonadotropic hypogonadism (HCC) 04/08/2011   As of 2017/18/19, Dr. Lucianne Muss ([facility]) is following: he started clomiph but no better at f/u 11/2016.  Pt denied any improvemt,though testost improved.  11/2017 Clomiphene d/c'd-topical testo started. His hypogonadism is due to insulin resistance syndrome.  As of 08/2017 endo f/u: pt staying on clomiphene (no testost) and is feeling some improved.  No imp 11/2017 f/u on topical testost (off clom)   Insomnia    Internal hemorrhoids, bleeding and sicharge 05/29/2013   OSA on CPAP    setting changed to 6 cm H20 (fixed) 10/2016 by pulm.   Rotator cuff tear 2011   [facility]: conservative mgmt--symptoms stable as of 04/2011.   Family History  Problem Relation Age of Onset   Hypertension Mother    Diabetes Mother    CVA Mother    Hypertension Father    Diabetes Father    Dementia Father    Heart disease Father    CAD Father    Colon cancer Neg Hx    Stomach cancer Neg Hx    Rectal cancer Neg Hx    Past Surgical History:  Procedure Laterality Date   adnoids     CARDIOVASCULAR  STRESS TEST     Normal stress nuclear study, EF normal. +Hypertensive bp response--subsequent 24H ambulatory BP monitoring confirmed HTN.   COLONOSCOPY  05/2006, 05/2012   HEMORRHOID BANDING     POLYPECTOMY     Adenomatous 2007; hyperplastic 2013.  Repeat 5 yrs.   TRANSTHORACIC ECHOCARDIOGRAM  01/30/16   Normal except mild aortic dilatation   WISDOM TOOTH EXTRACTION      Short Social History:  Social History   Tobacco Use   Smoking status: Never   Smokeless tobacco: Never  Substance Use Topics   Alcohol use: Yes    Alcohol/week: 0.0 standard drinks of alcohol    Comment: rarely    Allergies  Allergen Reactions   Molds & Smuts Other (See Comments)     Current Outpatient Medications  Medication Sig Dispense Refill   atorvastatin (LIPITOR) 10 MG tablet Take 1 tablet by mouth daily. 90 tablet 0   Blood Glucose Monitoring Suppl (ONETOUCH VERIO) w/Device KIT Check glucose once daily. 1 kit 0   empagliflozin (JARDIANCE) 25 MG TABS tablet Take 1 tablet (25 mg total) by mouth daily before breakfast. 90 tablet 2   fenofibrate (TRICOR) 145 MG tablet Take 1 tablet (145 mg total) by mouth daily. 90 tablet 0   glucose blood (ONETOUCH ULTRA) test strip Check blood sugar once a day at at different times 50 each 12   glucose blood test strip Check sugar once daily. 100 each 12   losartan (COZAAR) 50 MG tablet Take 1 tablet by mouth daily. 90 tablet 0   metFORMIN (GLUCOPHAGE) 1000 MG tablet Take 1 tablet by mouth twice daily with meals. 180 tablet 0   Semaglutide, 1 MG/DOSE, (OZEMPIC, 1 MG/DOSE,) 4 MG/3ML SOPN INJECT 1MG  INTO THE SKIN ONCE A WEEK 9 mL 2   Semaglutide, 1 MG/DOSE, (OZEMPIC, 1 MG/DOSE,) 4 MG/3ML SOPN Inject 1 mg into the skin every 7 (seven) days. 15 mL 1   Testosterone Undecanoate (JATENZO) 158 MG CAPS Take 1 capsule by mouth twice daily after meals. Please make follow-up appointment 60 capsule 0   No current facility-administered medications for this visit.    Review of Systems  Constitutional:  Constitutional negative. HENT: HENT negative.  Eyes: Eyes negative.  Respiratory: Respiratory negative.  Cardiovascular: Cardiovascular negative.  GI: Gastrointestinal negative.  Musculoskeletal: Positive for leg pain.  Skin: Skin negative.  Neurological: Neurological negative. Hematologic: Hematologic/lymphatic negative.  Psychiatric: Psychiatric negative.        Objective:  Objective   Vitals:   04/28/23 1116  BP: (!) 148/87  Pulse: 75  Resp: 18  Temp: 98 F (36.7 C)  TempSrc: Temporal  SpO2: 96%  Weight: 198 lb 12.8 oz (90.2 kg)  Height: 5\' 10"  (1.778 m)   Body mass index is 28.52 kg/m.  Physical Exam HENT:      Head: Normocephalic.     Nose: Nose normal.     Mouth/Throat:     Mouth: Mucous membranes are moist.  Eyes:     Pupils: Pupils are equal, round, and reactive to light.  Cardiovascular:     Rate and Rhythm: Normal rate.     Pulses:          Popliteal pulses are 2+ on the right side and 2+ on the left side.       Dorsalis pedis pulses are 2+ on the right side and 2+ on the left side.  Pulmonary:     Effort: Pulmonary effort is normal.  Abdominal:     General:  Abdomen is flat.  Musculoskeletal:        General: Normal range of motion.     Cervical back: Normal range of motion.     Right lower leg: No edema.     Left lower leg: No edema.  Skin:    General: Skin is warm.     Capillary Refill: Capillary refill takes less than 2 seconds.  Neurological:     General: No focal deficit present.     Mental Status: He is alert.  Psychiatric:        Mood and Affect: Mood normal.        Behavior: Behavior normal.        Thought Content: Thought content normal.        Judgment: Judgment normal.     Data: ABI Findings:  +---------+------------------+-----+---------+--------+  Right   Rt Pressure (mmHg)IndexWaveform Comment   +---------+------------------+-----+---------+--------+  Brachial 153                                       +---------+------------------+-----+---------+--------+  PTA     166               1.08 triphasic          +---------+------------------+-----+---------+--------+  DP      168               1.10 triphasic          +---------+------------------+-----+---------+--------+  Berline Chough               0.87                    +---------+------------------+-----+---------+--------+   +---------+------------------+-----+---------+-------+  Left    Lt Pressure (mmHg)IndexWaveform Comment  +---------+------------------+-----+---------+-------+  Brachial 153                                       +---------+------------------+-----+---------+-------+  PTA     181               1.18 triphasic         +---------+------------------+-----+---------+-------+  DP      189               1.24 triphasic         +---------+------------------+-----+---------+-------+  Great Toe131               0.86                   +---------+------------------+-----+---------+-------+   +-------+-----------+-----------+------------+------------+  ABI/TBIToday's ABIToday's TBIPrevious ABIPrevious TBI  +-------+-----------+-----------+------------+------------+  Right 1.10       0.87                                 +-------+-----------+-----------+------------+------------+  Left  1.24       0.86                                 +-------+-----------+-----------+------------+------------+           Summary:  Right: Resting right ankle-brachial index is within normal range. The  right toe-brachial index is normal.   Left: Resting left ankle-brachial index is within normal range. The left  toe-brachial index  is normal.      Assessment/Plan:    62 year old male with pain in the right leg without any vascular etiology.  He has bounding pulses bilaterally no swelling to suggest a venous cause.  He can see me on an as-needed basis.     Maeola Harman MD [facility]

## 2023-04-30 ENCOUNTER — Other Ambulatory Visit: Payer: Self-pay | Admitting: Endocrinology

## 2023-04-30 DIAGNOSIS — E782 Mixed hyperlipidemia: Secondary | ICD-10-CM

## 2023-05-25 ENCOUNTER — Other Ambulatory Visit (HOSPITAL_BASED_OUTPATIENT_CLINIC_OR_DEPARTMENT_OTHER): Payer: Self-pay

## 2023-05-25 MED ORDER — MELOXICAM 7.5 MG PO TABS
7.5000 mg | ORAL_TABLET | Freq: Every day | ORAL | 0 refills | Status: DC
  Filled 2023-05-25: qty 10, 10d supply, fill #0

## 2023-06-10 ENCOUNTER — Other Ambulatory Visit (HOSPITAL_BASED_OUTPATIENT_CLINIC_OR_DEPARTMENT_OTHER): Payer: Self-pay

## 2023-06-10 MED ORDER — MELOXICAM 15 MG PO TABS
15.0000 mg | ORAL_TABLET | Freq: Every day | ORAL | 0 refills | Status: DC | PRN
  Filled 2023-06-10: qty 30, 30d supply, fill #0

## 2023-06-15 ENCOUNTER — Other Ambulatory Visit (INDEPENDENT_AMBULATORY_CARE_PROVIDER_SITE_OTHER): Payer: 59

## 2023-06-15 DIAGNOSIS — E782 Mixed hyperlipidemia: Secondary | ICD-10-CM | POA: Diagnosis not present

## 2023-06-15 DIAGNOSIS — E1165 Type 2 diabetes mellitus with hyperglycemia: Secondary | ICD-10-CM

## 2023-06-15 DIAGNOSIS — E230 Hypopituitarism: Secondary | ICD-10-CM

## 2023-06-15 LAB — LIPID PANEL
Cholesterol: 111 mg/dL (ref 0–200)
HDL: 20 mg/dL — ABNORMAL LOW (ref >39.00)
Total CHOL/HDL Ratio: 6
Triglycerides: 426 mg/dL — ABNORMAL HIGH (ref 0.0–149.0)

## 2023-06-15 LAB — COMPREHENSIVE METABOLIC PANEL
ALT: 97 U/L — ABNORMAL HIGH (ref 0–53)
AST: 40 U/L — ABNORMAL HIGH (ref 0–37)
Albumin: 4.4 g/dL (ref 3.5–5.2)
Alkaline Phosphatase: 48 U/L (ref 39–117)
BUN: 12 mg/dL (ref 6–23)
CO2: 26 meq/L (ref 19–32)
Calcium: 9.7 mg/dL (ref 8.4–10.5)
Chloride: 104 meq/L (ref 96–112)
Creatinine, Ser: 1.08 mg/dL (ref 0.40–1.50)
GFR: 72.68 mL/min (ref >60.00)
Glucose, Bld: 230 mg/dL — ABNORMAL HIGH (ref 70–99)
Potassium: 4.9 meq/L (ref 3.5–5.1)
Sodium: 139 meq/L (ref 135–145)
Total Bilirubin: 0.3 mg/dL (ref 0.2–1.2)
Total Protein: 7.1 g/dL (ref 6.0–8.3)

## 2023-06-15 LAB — HEMOGLOBIN A1C: Hgb A1c MFr Bld: 7.2 % — ABNORMAL HIGH (ref 4.6–6.5)

## 2023-06-15 LAB — TESTOSTERONE: Testosterone: 459.15 ng/dL (ref 300.00–890.00)

## 2023-06-15 LAB — LDL CHOLESTEROL, DIRECT: Direct LDL: 56 mg/dL

## 2023-06-22 ENCOUNTER — Encounter: Payer: Self-pay | Admitting: Endocrinology

## 2023-06-22 ENCOUNTER — Other Ambulatory Visit (HOSPITAL_BASED_OUTPATIENT_CLINIC_OR_DEPARTMENT_OTHER): Payer: Self-pay

## 2023-06-22 ENCOUNTER — Ambulatory Visit: Payer: 59 | Admitting: Endocrinology

## 2023-06-22 VITALS — BP 136/80 | HR 81 | Resp 20 | Ht 70.0 in | Wt 200.2 lb

## 2023-06-22 DIAGNOSIS — Z5181 Encounter for therapeutic drug level monitoring: Secondary | ICD-10-CM

## 2023-06-22 DIAGNOSIS — E1165 Type 2 diabetes mellitus with hyperglycemia: Secondary | ICD-10-CM | POA: Diagnosis not present

## 2023-06-22 DIAGNOSIS — E782 Mixed hyperlipidemia: Secondary | ICD-10-CM

## 2023-06-22 DIAGNOSIS — Z7985 Long-term (current) use of injectable non-insulin antidiabetic drugs: Secondary | ICD-10-CM

## 2023-06-22 DIAGNOSIS — Z7984 Long term (current) use of oral hypoglycemic drugs: Secondary | ICD-10-CM

## 2023-06-22 DIAGNOSIS — E230 Hypopituitarism: Secondary | ICD-10-CM

## 2023-06-22 LAB — MICROALBUMIN / CREATININE URINE RATIO
Creatinine,U: 28.7 mg/dL
Microalb Creat Ratio: 2.4 mg/g (ref 0.0–30.0)
Microalb, Ur: <0.7 mg/dL (ref 0.0–1.9)

## 2023-06-22 LAB — PSA: PSA: 1.95 ng/mL (ref 0.10–4.00)

## 2023-06-22 MED ORDER — OZEMPIC (1 MG/DOSE) 4 MG/3ML ~~LOC~~ SOPN
1.0000 mg | PEN_INJECTOR | SUBCUTANEOUS | 2 refills | Status: DC
Start: 2023-06-22 — End: 2024-03-14
  Filled 2023-06-22: qty 3, 28d supply, fill #0
  Filled 2023-07-20: qty 3, 28d supply, fill #1
  Filled 2023-08-26: qty 3, 28d supply, fill #2
  Filled 2023-09-21: qty 3, 28d supply, fill #3
  Filled 2023-10-20: qty 3, 28d supply, fill #4
  Filled 2023-11-10: qty 3, 28d supply, fill #5
  Filled 2023-12-13: qty 3, 28d supply, fill #6
  Filled 2024-01-12: qty 3, 28d supply, fill #7
  Filled 2024-02-15: qty 3, 28d supply, fill #8

## 2023-06-22 MED ORDER — METFORMIN HCL 1000 MG PO TABS
1000.0000 mg | ORAL_TABLET | Freq: Two times a day (BID) | ORAL | 3 refills | Status: DC
Start: 2023-06-22 — End: 2024-06-05
  Filled 2023-06-22: qty 60, 30d supply, fill #0
  Filled 2023-07-20: qty 60, 30d supply, fill #1
  Filled 2023-08-18: qty 60, 30d supply, fill #2
  Filled 2023-09-18: qty 60, 30d supply, fill #3
  Filled 2023-10-16: qty 60, 30d supply, fill #4
  Filled 2023-11-16: qty 60, 30d supply, fill #5
  Filled 2023-12-21: qty 60, 30d supply, fill #6
  Filled 2024-01-18: qty 60, 30d supply, fill #7
  Filled 2024-02-10: qty 60, 30d supply, fill #8
  Filled 2024-03-13: qty 60, 30d supply, fill #9

## 2023-06-22 MED ORDER — ATORVASTATIN CALCIUM 10 MG PO TABS
10.0000 mg | ORAL_TABLET | Freq: Every day | ORAL | 3 refills | Status: AC
Start: 2023-06-22 — End: ?
  Filled 2023-06-22: qty 30, 30d supply, fill #0
  Filled 2023-07-20: qty 30, 30d supply, fill #1
  Filled 2023-08-18: qty 30, 30d supply, fill #2
  Filled 2023-10-14: qty 30, 30d supply, fill #3
  Filled 2023-11-10: qty 30, 30d supply, fill #4
  Filled 2023-12-13: qty 30, 30d supply, fill #5
  Filled 2024-01-12: qty 30, 30d supply, fill #6
  Filled 2024-02-10: qty 30, 30d supply, fill #7
  Filled 2024-03-13: qty 30, 30d supply, fill #8

## 2023-06-22 MED ORDER — JATENZO 158 MG PO CAPS: ORAL_CAPSULE | ORAL | 4 refills | Status: DC

## 2023-06-22 NOTE — Progress Notes (Signed)
Outpatient Endocrinology Note Douglas Stelios Kirby, MD  06/22/23  Patient's Name: Douglas [name]    DOB: 1961-05-05    MRN: [id number]                                                    REASON OF VISIT: Follow up for type 2 diabetes mellitus Douglas [name]  PCP: Medicine, [facility]  HISTORY OF PRESENT ILLNESS:   Douglas [name] is a 62 y.o. old male with past medical history listed below, is here for follow up of type 2 diabetes mellitus /hypogonadism.  Patient was last seen by Dr. Lucianne Muss in July 2024.  Pertinent Diabetes History: Patient was diagnosed with type 2 diabetes mellitus in 2016 based on records, at the onset hemoglobin A1c was 12%.  He was initially managed with Actos and metformin, Invokana was added in August 2018 and Ozempic was guarded in November 2018.  He has fair control of type 2 diabetes mellitus.  Chronic Diabetes Complications : Retinopathy: no. Last ophthalmology exam was done on annually, reportedly.  Nephropathy: no, on losartan Peripheral neuropathy: no Coronary artery disease: no Stroke: no  Relevant comorbidities and cardiovascular risk factors: Obesity: no Body mass index is 28.73 kg/m.  Hypertension: yes Hyperlipidemia. Yes, on statin and fenofibrate.  Current / Home Diabetic regimen includes: Metformin 1000 mg 2 times a day. Ozempic 1 mg weekly. Jardiance 25 mg daily.  Prior diabetic medications: Actos, Farxiga, Invokana.  Glycemic data:   No glucometer data to review.  He forgot to bring glucometer in the clinic today.  Hypoglycemia: Patient has no hypoglycemic episodes. Patient has hypoglycemia awareness.  Factors modifying glucose control: 1.  Diabetic diet assessment: 3 meals a day.  2.  Staying active or exercising:   3.  Medication compliance: compliant most of the time.  # Hypogonadotropic hypogonadism -Diagnosed in 2009. He was apparently initially seen by his psychiatrist and was having difficulties with  fatigue, agoraphobia and decreased libido as well as sexual dysfunction. He was told that his testosterone level was significantly low and was given testosterone supplements probably with AndroGel.Subsequently the patient has been treated by various physicians including the urologist with testosterone supplements, mostly AndroGel. He thinks that his testosterone levels have never been normal with AndroGel although he thinks that he felt a little better with taking this from his urologist who called him to apply it on his abdomen. He was also tried on monthly injections for about 3 months without consistent benefit also. In 2016 he was given [place] again without any benefit. He had baseline complaints of fatigue, decreased motivation, decreased libido and erectile dysfunction from hypogonadism. Initial evaluation showed low free testosterone of 4.5 and normal LH  He had been on clomiphene 25 mg daily after his initial consultation in 09/2015. Because of continued fatigue, decreased libido and testosterone levels being on the low end of normal he was switched to ANDROGEL 3 pumps daily in 11/2017. He was having difficulty applying this in the mornings because of not having time before going to work. With this his testosterone levels could be consistently low. He was switched to Androderm, 8 mg daily in October 2020. With the treatment he had improved energy level and less weakness; however because of skin irritation this was discontinued. He was given a prescription for [facility] but he tends to have nasal  congestion and blockage with this and this was stopped.  He was later switched to [facility].  He is taking JATENZO 158 mg twice a day after prior authorization.  He is taking it with breakfast and dinner fairly consistently.  Interval history 06/22/23 Patient forgot to bring glucometer.  Recent hemoglobin A1c 7.2% improving.  Diabetes regimen as mentioned above.  He has been taking Jatenzo 158 mg 2 times a day.   Reports compliance.  Recent labs reviewed, total testosterone 459.  Normal liver enzymes.  Acceptable cholesterol with direct LDL 56 however triglycerides elevated 426 was nonfasting sample.  No numbness and tingling of the feet.  No vision problem.  No other complaints today.  REVIEW OF SYSTEMS As per history of present illness.   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   Adenomatous colon polyp 05/07/06; 05/2012   Repeat colonoscopy 05/17/2012 showed 2 small polyps that were removed--path showed Hyperplastic-not adenomatous.  Still needs repeat 5 yrs.   Angioedema of lips    saw allergist 11/2011   Asthmatic bronchitis , chronic (HCC) 01/11/2012   Question occupational asthma with no specific agents identified. He does notice that some areas of his job are particularly dusty and dirty. Currently he is well controlled using Symbicort through an AeroChamber and rarely needing his rescue inhaler. This would be an acceptable long-term status. Elevated nonspecific total IgE and peripheral eosinophilia to suggest an atopic trigger with sensitization to something Consider Daliresp for future use if needed    Chronic fatigue    +daytime somnolence   Chronic recurrent sinusitis 02/28/2012   Depression    Prozac in the past not much help.  Spontaneously resolved.   Diabetes mellitus without complication (HCC)    managed by Dr. Lucianne Muss (endo)--this is accurate as of 02/2018, when pt last saw Dr. Lucianne Muss.   Diverticulosis    Generalized anxiety disorder 08/11/2012   Hemorrhoids    ext and int   Hyperlipemia, mixed    elev trigs with low HDL. Fish oil OTC.  Statin started in Fall 2018.  Fenofibrate started 07/2018   Hypertension 2017   Hypogonadotropic hypogonadism (HCC) 04/08/2011   As of 2017/18/19, Dr. Lucianne Muss (endo) is following: he started clomiph but no better at f/u  11/2016.  Pt denied any improvemt,though testost improved.  11/2017 Clomiphene d/c'd-topical testo started. His hypogonadism is due to insulin resistance syndrome.  As of 08/2017 endo f/u: pt staying on clomiphene (no testost) and is feeling some improved.  No imp 11/2017 f/u on topical testost (off clom)   Insomnia    Internal hemorrhoids, bleeding and sicharge 05/29/2013   OSA on CPAP    setting changed to 6 cm H20 (fixed) 10/2016 by pulm.   Rotator cuff tear 2011   [facility]: conservative mgmt--symptoms stable as of 04/2011.    PAST SURGICAL HISTORY: Past Surgical History:  Procedure Laterality Date   adnoids     CARDIOVASCULAR STRESS TEST     Normal stress nuclear study, EF normal. +Hypertensive bp response--subsequent 24H ambulatory BP monitoring confirmed HTN.   COLONOSCOPY  05/2006, 05/2012   HEMORRHOID BANDING     POLYPECTOMY     Adenomatous 2007; hyperplastic 2013.  Repeat 5 yrs.   TRANSTHORACIC ECHOCARDIOGRAM  01/30/16   Normal except mild aortic dilatation   WISDOM TOOTH EXTRACTION      ALLERGIES: Allergies  Allergen Reactions   Molds & Smuts Other (See Comments)    FAMILY HISTORY:  Family History  Problem Relation Age of Onset   Hypertension Mother    Diabetes Mother    CVA Mother    Hypertension Father    Diabetes Father    Dementia Father    Heart disease Father    CAD Father    Colon cancer Neg Hx    Stomach cancer Neg Hx    Rectal cancer Neg Hx     SOCIAL HISTORY: Social History   Socioeconomic History   Marital status: Married    Spouse name: Not on file   Number of children: 1   Years of education: Not on file   Highest education level: Not on file  Occupational History   Occupation: Psychologist, prison and probation services: [employer]  Tobacco Use   Smoking status: Never   Smokeless tobacco: Never  Vaping Use   Vaping status: Never Used  Substance and Sexual Activity   Alcohol use: Yes    Alcohol/week: 0.0 standard drinks of alcohol    Comment:  rarely   Drug use: No   Sexual activity: Not on file  Other Topics Concern   Not on file  Social History Narrative   Married, one 73 y/o daughter, wife is [place].   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   No T/A/Ds.  Works out regularly.   Social Determinants of Health   Financial Resource Strain: Low Risk  (06/23/2022)   Received from [facility], [facility]   Overall Financial Resource Strain (CARDIA)    Difficulty of Paying Living Expenses: Not hard at all  Food Insecurity: No Food Insecurity (06/23/2022)   Received from [facility], [facility]   Hunger Vital Sign    Worried About Running Out of Food in the Last Year: Never true    Ran Out of Food in the Last Year: Never true  Transportation Needs: No Transportation Needs (10/10/2020)   [facility]   PRAPARE - Transportation    Lack of Transportation (Medical): No    Lack of Transportation (Non-Medical): No  Physical Activity: Insufficiently Active (06/23/2022)   [facility]   Exercise Vital Sign    Days of Exercise per Week: 1 day    Minutes of Exercise per Session: 10 min  Stress: Stress Concern Present (06/23/2022)   Received from [facility], [facility] Occupational Health - Occupational Stress Questionnaire    Feeling of Stress : To some extent  Social Connections: Socially Isolated (06/23/2022)   [facility], [facility]   Social Network    How would you rate your social network (family, work, friends)?: Little participation, lonely and socially isolated    MEDICATIONS:  Current Outpatient Medications  Medication Sig Dispense Refill   empagliflozin (JARDIANCE) 25 MG TABS tablet Take 1 tablet (25 mg total) by mouth daily before breakfast. 90 tablet 2   fenofibrate (TRICOR) 145 MG tablet Take 1 tablet (145 mg total)  by mouth daily. 90 tablet 0   losartan (COZAAR) 50 MG tablet Take 1 tablet by mouth daily. 90 tablet 0   meloxicam (MOBIC) 15 MG tablet Take 1 tablet (15 mg total) by mouth daily with food for 5 (five) to 7 (seven) days, then as needed. 30 tablet 0  meloxicam (MOBIC) 7.5 MG tablet Take 1 tablet (7.5 mg total) by mouth daily. 10 tablet 0   atorvastatin (LIPITOR) 10 MG tablet Take 1 tablet (10 mg total) by mouth daily. 90 tablet 3   Blood Glucose Monitoring Suppl (ONETOUCH VERIO) w/Device KIT Check glucose once daily. (Patient not taking: Reported on 06/22/2023) 1 kit 0   glucose blood (ONETOUCH ULTRA) test strip Check blood sugar once a day at at different times (Patient not taking: Reported on 06/22/2023) 50 each 12   glucose blood test strip Check sugar once daily. (Patient not taking: Reported on 06/22/2023) 100 each 12   metFORMIN (GLUCOPHAGE) 1000 MG tablet Take 1 tablet (1,000 mg total) by mouth 2 (two) times daily with a meal. 180 tablet 3   Semaglutide, 1 MG/DOSE, (OZEMPIC, 1 MG/DOSE,) 4 MG/3ML SOPN Inject 1 mg into the skin once a week. 9 mL 2   Testosterone Undecanoate (JATENZO) 158 MG CAPS Take 1 capsule by mouth twice daily after meals. Please make follow-up appointment 60 capsule 4   No current facility-administered medications for this visit.    PHYSICAL EXAM: Vitals:   06/22/23 1308  BP: 136/80  Pulse: 81  Resp: 20  SpO2: 96%  Weight: 200 lb 3.2 oz (90.8 kg)  Height: 5\' 10"  (1.778 m)   Body mass index is 28.73 kg/m.  Wt Readings from Last 3 Encounters:  06/22/23 200 lb 3.2 oz (90.8 kg)  04/28/23 198 lb 12.8 oz (90.2 kg)  03/19/23 206 lb (93.4 kg)    General: Well developed, well nourished male in no apparent distress.  HEENT: AT/[place], no external lesions.  Eyes: Conjunctiva clear and no icterus. Neck: Neck supple  Lungs: Respirations not labored Neurologic: Alert, oriented, normal speech Extremities / Skin: Dry. No sores or rashes noted.  Psychiatric: Does not  appear depressed or anxious  Diabetic Foot Exam - Simple   No data filed    LABS Reviewed Lab Results  Component Value Date   HGBA1C 7.2 (H) 06/15/2023   HGBA1C 7.4 (H) 03/17/2023   HGBA1C 8.9 (H) 11/19/2022   Lab Results  Component Value Date   FRUCTOSAMINE 234 09/03/2017   Lab Results  Component Value Date   CHOL 111 06/15/2023   HDL 20.00 (L) 06/15/2023   LDLCALC 46 11/19/2022   LDLDIRECT 56.0 06/15/2023   TRIG (H) 06/15/2023    426.0 Triglyceride is over 400; calculations on Lipids are invalid.   CHOLHDL 6 06/15/2023   Lab Results  Component Value Date   MICRALBCREAT 1.6 10/02/2021   MICRALBCREAT 1.0 06/13/2020   Lab Results  Component Value Date   CREATININE 1.08 06/15/2023   Lab Results  Component Value Date   GFR 72.68 06/15/2023    ASSESSMENT / PLAN  1. Uncontrolled type 2 diabetes mellitus with hyperglycemia, without long-term current use of insulin (HCC)   2. Encounter for medication monitoring   3. Hypogonadotropic hypogonadism (HCC)   4. Mixed hyperlipidemia     Diabetes Mellitus type 2, complicated by no known complications. - Diabetic status / severity: Fair control.  Lab Results  Component Value Date   HGBA1C 7.2 (H) 06/15/2023    - Hemoglobin A1c goal : <7%  - Medications: No change  I) metformin 1000 mg 2 times a day. II) Ozempic 1 mg weekly. III) Jardiance 25 mg daily.  - Home glucose testing: In the morning fasting and occasionally at bedtime.  Asked to bring the glucometer in the clinic today.  Patient may request for test  strips and lancets after checking his type of glucometer and type of supplies.  - Discussed/ Gave Hypoglycemia treatment plan.  # Consult : not required at this time.   # Annual urine for microalbuminuria/ creatinine ratio, no microalbuminuria currently, continue ACE/ARB /we will check today.  On losartan. Last  Lab Results  Component Value Date   MICRALBCREAT 1.6 10/02/2021    # Foot check  nightly.  # Annual dilated diabetic eye exams.   - Diet: Make healthy diabetic food choices - Life style / activity / exercise: Discussed.  2. Blood pressure  -  BP Readings from Last 1 Encounters:  06/22/23 136/80    - Control is in target.  - No change in current plans.  3. Lipid status / Hyperlipidemia - Last  Lab Results  Component Value Date   LDLCALC 46 11/19/2022   - Continue atorvastatin 10 mg daily and fenofibrate 145 mg daily.  # Hypogonadotropic hypogonadism -Recent total testosterone normal.  Continue Jatenzo 158 mg 2 times a day with meals. -Hematocrit and liver enzymes normal.  Will check PSA to monitor testosterone therapy.  Diagnoses and all orders for this visit:  Uncontrolled type 2 diabetes mellitus with hyperglycemia, without long-term current use of insulin (HCC) -     Microalbumin / creatinine urine ratio; Future -     Semaglutide, 1 MG/DOSE, (OZEMPIC, 1 MG/DOSE,) 4 MG/3ML SOPN; Inject 1 mg into the skin once a week. -     metFORMIN (GLUCOPHAGE) 1000 MG tablet; Take 1 tablet (1,000 mg total) by mouth 2 (two) times daily with a meal. -     Microalbumin / creatinine urine ratio  Encounter for medication monitoring -     PSA; Future -     PSA  Hypogonadotropic hypogonadism (HCC) -     PSA; Future -     PSA  Mixed hyperlipidemia -     atorvastatin (LIPITOR) 10 MG tablet; Take 1 tablet (10 mg total) by mouth daily.  Other orders -     Testosterone Undecanoate (JATENZO) 158 MG CAPS; Take 1 capsule by mouth twice daily after meals. Please make follow-up appointment    DISPOSITION Follow up in clinic in 3  months suggested.   All questions answered and patient verbalized understanding of the plan.  Douglas Dearra Myhand, MD [facility] Endocrinology [facility] [address] [place], [address] [place], [place] [address] Phone # [phone]  At least part of this note was generated using voice recognition software. Inadvertent word errors may have  occurred, which were not recognized during the proofreading process.

## 2023-06-23 ENCOUNTER — Other Ambulatory Visit (HOSPITAL_BASED_OUTPATIENT_CLINIC_OR_DEPARTMENT_OTHER): Payer: Self-pay

## 2023-07-06 ENCOUNTER — Other Ambulatory Visit: Payer: Self-pay

## 2023-07-06 ENCOUNTER — Other Ambulatory Visit (HOSPITAL_BASED_OUTPATIENT_CLINIC_OR_DEPARTMENT_OTHER): Payer: Self-pay

## 2023-07-06 ENCOUNTER — Other Ambulatory Visit: Payer: Self-pay | Admitting: Endocrinology

## 2023-07-06 MED ORDER — JATENZO 158 MG PO CAPS
158.0000 mg | ORAL_CAPSULE | Freq: Two times a day (BID) | ORAL | 4 refills | Status: DC
  Filled 2023-07-06: qty 60, 30d supply, fill #0
  Filled 2023-08-03: qty 60, 30d supply, fill #1
  Filled 2023-09-01: qty 60, 30d supply, fill #2
  Filled 2023-10-01: qty 60, 30d supply, fill #3
  Filled 2023-10-07 – 2023-11-02 (×2): qty 60, 30d supply, fill #4

## 2023-07-06 MED ORDER — JATENZO 158 MG PO CAPS: ORAL_CAPSULE | ORAL | 4 refills | Status: DC

## 2023-07-21 ENCOUNTER — Other Ambulatory Visit (HOSPITAL_BASED_OUTPATIENT_CLINIC_OR_DEPARTMENT_OTHER): Payer: Self-pay

## 2023-07-21 MED ORDER — COVID-19 MRNA VAC-TRIS(PFIZER) 30 MCG/0.3ML IM SUSY
0.3000 mL | PREFILLED_SYRINGE | Freq: Once | INTRAMUSCULAR | 0 refills | Status: AC
  Filled 2023-07-21: qty 0.3, 1d supply, fill #0

## 2023-07-21 MED ORDER — INFLUENZA VIRUS VACC SPLIT PF (FLUZONE) 0.5 ML IM SUSY
0.5000 mL | PREFILLED_SYRINGE | Freq: Once | INTRAMUSCULAR | 0 refills | Status: AC
  Filled 2023-07-21: qty 0.5, 1d supply, fill #0

## 2023-08-03 ENCOUNTER — Other Ambulatory Visit (HOSPITAL_BASED_OUTPATIENT_CLINIC_OR_DEPARTMENT_OTHER): Payer: Self-pay

## 2023-08-11 ENCOUNTER — Other Ambulatory Visit (HOSPITAL_BASED_OUTPATIENT_CLINIC_OR_DEPARTMENT_OTHER): Payer: Self-pay

## 2023-08-12 ENCOUNTER — Other Ambulatory Visit (HOSPITAL_BASED_OUTPATIENT_CLINIC_OR_DEPARTMENT_OTHER): Payer: Self-pay

## 2023-08-13 ENCOUNTER — Other Ambulatory Visit: Payer: Self-pay

## 2023-08-13 ENCOUNTER — Other Ambulatory Visit (HOSPITAL_BASED_OUTPATIENT_CLINIC_OR_DEPARTMENT_OTHER): Payer: Self-pay

## 2023-08-13 DIAGNOSIS — I10 Essential (primary) hypertension: Secondary | ICD-10-CM

## 2023-08-13 MED ORDER — LOSARTAN POTASSIUM 50 MG PO TABS
50.0000 mg | ORAL_TABLET | Freq: Every day | ORAL | 0 refills | Status: DC
  Filled 2023-08-13: qty 30, 30d supply, fill #0
  Filled 2023-09-13: qty 30, 30d supply, fill #1
  Filled 2023-10-14: qty 30, 30d supply, fill #2

## 2023-09-02 ENCOUNTER — Other Ambulatory Visit: Payer: Self-pay

## 2023-10-04 ENCOUNTER — Other Ambulatory Visit (HOSPITAL_BASED_OUTPATIENT_CLINIC_OR_DEPARTMENT_OTHER): Payer: Self-pay

## 2023-10-05 ENCOUNTER — Other Ambulatory Visit: Payer: Self-pay

## 2023-10-07 ENCOUNTER — Other Ambulatory Visit (HOSPITAL_BASED_OUTPATIENT_CLINIC_OR_DEPARTMENT_OTHER): Payer: Self-pay

## 2023-10-26 ENCOUNTER — Ambulatory Visit: Payer: 59 | Admitting: Endocrinology

## 2023-10-26 ENCOUNTER — Encounter: Payer: Self-pay | Admitting: Endocrinology

## 2023-10-26 VITALS — BP 132/82 | HR 75 | Resp 20 | Ht 70.0 in | Wt 190.4 lb

## 2023-10-26 DIAGNOSIS — E1165 Type 2 diabetes mellitus with hyperglycemia: Secondary | ICD-10-CM

## 2023-10-26 DIAGNOSIS — Z7984 Long term (current) use of oral hypoglycemic drugs: Secondary | ICD-10-CM | POA: Diagnosis not present

## 2023-10-26 DIAGNOSIS — E230 Hypopituitarism: Secondary | ICD-10-CM | POA: Diagnosis not present

## 2023-10-26 DIAGNOSIS — Z5181 Encounter for therapeutic drug level monitoring: Secondary | ICD-10-CM

## 2023-10-26 DIAGNOSIS — Z7985 Long-term (current) use of injectable non-insulin antidiabetic drugs: Secondary | ICD-10-CM

## 2023-10-26 LAB — POCT GLYCOSYLATED HEMOGLOBIN (HGB A1C): Hemoglobin A1C: 6.5 % — AB (ref 4.0–5.6)

## 2023-10-26 NOTE — Progress Notes (Signed)
 Outpatient Endocrinology Note Douglas Ezekiah Massie, MD  10/26/23  Patient's Name: Douglas [name]    DOB: 04-Oct-1960    MRN: [id number]                                                    REASON OF VISIT: Follow up for type 2 diabetes mellitus / hypogonadism  PCP: Medicine, [facility]  HISTORY OF PRESENT ILLNESS:   Douglas [name] is a 63 y.o. old male with past medical history listed below, is here for follow up of type 2 diabetes mellitus /hypogonadism.  Patient was last seen by Dr. Lucianne Muss in July 2024.  Pertinent Diabetes History: Patient was diagnosed with type 2 diabetes mellitus in 2016 based on records, at the onset hemoglobin A1c was 12%.  He was initially managed with Actos and metformin, Invokana was added in August 2018 and Ozempic was started in November 2018.  He has fair control of type 2 diabetes mellitus.  Chronic Diabetes Complications : Retinopathy: no. Last ophthalmology exam was done on annually, reportedly.  Nephropathy: no, on losartan Peripheral neuropathy: no Coronary artery disease: no Stroke: no  Relevant comorbidities and cardiovascular risk factors: Obesity: no Body mass index is 27.32 kg/m.  Hypertension: yes Hyperlipidemia. Yes, on statin and fenofibrate.  Current / Home Diabetic regimen includes: Metformin 1000 mg 2 times a day. Ozempic 1 mg weekly. Jardiance 25 mg daily.  Prior diabetic medications: Actos, Farxiga, Invokana.  Glycemic data:   No glucometer data to review.  He forgot to bring glucometer in the clinic today.  Hypoglycemia: Patient has no hypoglycemic episodes. Patient has hypoglycemia awareness.  Factors modifying glucose control: 1.  Diabetic diet assessment: 3 meals a day.  2.  Staying active or exercising:   3.  Medication compliance: compliant most of the time.  # Hypogonadotropic hypogonadism -Diagnosed in 2009. He was apparently initially seen by his psychiatrist and was having difficulties with  fatigue, agoraphobia and decreased libido as well as sexual dysfunction. He was told that his testosterone level was significantly low and was given testosterone supplements probably with AndroGel.Subsequently the patient has been treated by various physicians including the urologist with testosterone supplements, mostly AndroGel. He thinks that his testosterone levels have never been normal with AndroGel although he thinks that he felt a little better with taking this from his urologist who called him to apply it on his abdomen. He was also tried on monthly injections for about 3 months without consistent benefit also. In 2016 he was given [place] again without any benefit. He had baseline complaints of fatigue, decreased motivation, decreased libido and erectile dysfunction from hypogonadism. Initial evaluation showed low free testosterone of 4.5 and normal LH  He had been on clomiphene 25 mg daily after his initial consultation in 09/2015. Because of continued fatigue, decreased libido and testosterone levels being on the low end of normal he was switched to ANDROGEL 3 pumps daily in 11/2017. He was having difficulty applying this in the mornings because of not having time before going to work. With this his testosterone levels could be consistently low. He was switched to Androderm, 8 mg daily in October 2020. With the treatment he had improved energy level and less weakness; however because of skin irritation this was discontinued. He was given a prescription for [facility] but he tends to have  nasal congestion and blockage with this and this was stopped.  He was later switched to [facility].  He is taking JATENZO 158 mg twice a day after prior authorization.  He is taking it with breakfast and dinner fairly consistently.  Interval history  Hemoglobin A1c today 6.5% improved.  He reports he has been eating better lately.  Diabetes has been reviewed and as noted above.  He denies numbness and ting of the feet.   He has been taking Jatenzo 2 times a day.  He complains of erectile dysfunction, tried sildenafil and tadalafil, did not work, had followed up with urology as well. No numbness and tingling of the feet.  No vision problem.  No other complaints today.  REVIEW OF SYSTEMS As per history of present illness.   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   Adenomatous colon polyp 05/07/06; 05/2012   Repeat colonoscopy 05/17/2012 showed 2 small polyps that were removed--path showed Hyperplastic-not adenomatous.  Still needs repeat 5 yrs.   Angioedema of lips    saw allergist 11/2011   Asthmatic bronchitis , chronic (HCC) 01/11/2012   Question occupational asthma with no specific agents identified. He does notice that some areas of his job are particularly dusty and dirty. Currently he is well controlled using Symbicort through an AeroChamber and rarely needing his rescue inhaler. This would be an acceptable long-term status. Elevated nonspecific total IgE and peripheral eosinophilia to suggest an atopic trigger with sensitization to something Consider Daliresp for future use if needed    Chronic fatigue    +daytime somnolence   Chronic recurrent sinusitis 02/28/2012   Depression    Prozac in the past not much help.  Spontaneously resolved.   Diabetes mellitus without complication (HCC)    managed by Dr. Lucianne Muss (endo)--this is accurate as of 02/2018, when pt last saw Dr. Lucianne Muss.   Diverticulosis    Generalized anxiety disorder 08/11/2012   Hemorrhoids    ext and int   Hyperlipemia, mixed    elev trigs with low HDL. Fish oil OTC.  Statin started in Fall 2018.  Fenofibrate started 07/2018   Hypertension 2017   Hypogonadotropic hypogonadism (HCC) 04/08/2011   As of 2017/18/19, Dr. Lucianne Muss (endo) is following: he started clomiph but no better at f/u 11/2016.  Pt denied any  improvemt,though testost improved.  11/2017 Clomiphene d/c'd-topical testo started. His hypogonadism is due to insulin resistance syndrome.  As of 08/2017 endo f/u: pt staying on clomiphene (no testost) and is feeling some improved.  No imp 11/2017 f/u on topical testost (off clom)   Insomnia    Internal hemorrhoids, bleeding and sicharge 05/29/2013   OSA on CPAP    setting changed to 6 cm H20 (fixed) 10/2016 by pulm.   Rotator cuff tear 2011   [facility]: conservative mgmt--symptoms stable as of 04/2011.    PAST SURGICAL HISTORY: Past Surgical History:  Procedure Laterality Date   adnoids     CARDIOVASCULAR STRESS TEST     Normal stress nuclear study, EF normal. +Hypertensive bp response--subsequent 24H ambulatory BP monitoring confirmed HTN.   COLONOSCOPY  05/2006, 05/2012   HEMORRHOID BANDING     POLYPECTOMY     Adenomatous 2007; hyperplastic 2013.  Repeat 5 yrs.   TRANSTHORACIC ECHOCARDIOGRAM  01/30/16   Normal except mild aortic dilatation   WISDOM TOOTH EXTRACTION  ALLERGIES: Allergies  Allergen Reactions   Molds & Smuts Other (See Comments)    FAMILY HISTORY:  Family History  Problem Relation Age of Onset   Hypertension Mother    Diabetes Mother    CVA Mother    Hypertension Father    Diabetes Father    Dementia Father    Heart disease Father    CAD Father    Colon cancer Neg Hx    Stomach cancer Neg Hx    Rectal cancer Neg Hx     SOCIAL HISTORY: Social History   Socioeconomic History   Marital status: Married    Spouse name: Not on file   Number of children: 1   Years of education: Not on file   Highest education level: Not on file  Occupational History   Occupation: Psychologist, prison and probation services: [employer]  Tobacco Use   Smoking status: Never   Smokeless tobacco: Never  Vaping Use   Vaping status: Never Used  Substance and Sexual Activity   Alcohol use: Yes    Alcohol/week: 0.0 standard drinks of alcohol    Comment: rarely   Drug use: No    Sexual activity: Not on file  Other Topics Concern   Not on file  Social History Narrative   Married, one 39 y/o daughter, wife is [place].   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   No T/A/Ds.  Works out regularly.   Social Drivers of Corporate investment banker Strain: Low Risk  (06/23/2022)   Received from [facility], [facility]   Overall Financial Resource Strain (CARDIA)    Difficulty of Paying Living Expenses: Not hard at all  Food Insecurity: No Food Insecurity (06/23/2022)   Received from [facility], [facility]   Hunger Vital Sign    Worried About Running Out of Food in the Last Year: Never true    Ran Out of Food in the Last Year: Never true  Transportation Needs: No Transportation Needs (10/10/2020)   [facility], [facility]   PRAPARE - Transportation    Lack of Transportation (Medical): No    Lack of Transportation (Non-Medical): No  Physical Activity: Insufficiently Active (06/23/2022)   Received from [facility], [facility]   Exercise Vital Sign    Days of Exercise per Week: 1 day    Minutes of Exercise per Session: 10 min  Stress: Stress Concern Present (06/23/2022)   Received from [facility], [facility] - Occupational Stress Questionnaire    Feeling of Stress : To some extent  Social Connections: Unknown (07/05/2023)   Received from [facility]   Social Network    Social Network: Not on file    MEDICATIONS:  Current Outpatient Medications  Medication Sig Dispense Refill   atorvastatin (LIPITOR) 10 MG tablet Take 1 tablet (10 mg total) by mouth daily. 90 tablet 3   empagliflozin (JARDIANCE) 25 MG TABS tablet Take 1 tablet (25 mg total) by mouth daily before breakfast. 90 tablet 2   losartan (COZAAR) 50 MG tablet Take 1 tablet (50 mg total) by mouth daily. 90 tablet 0   metFORMIN (GLUCOPHAGE)  1000 MG tablet Take 1 tablet (1,000 mg total) by mouth 2 (two) times daily with a meal. 180 tablet 3   Semaglutide, 1 MG/DOSE, (OZEMPIC, 1 MG/DOSE,) 4 MG/3ML SOPN Inject 1 mg into the  skin once a week. 9 mL 2   Testosterone Undecanoate (JATENZO) 158 MG CAPS Take 1 capsule (158 mg total) by mouth 2 (two) times daily after a meal. Please make follow-up appointment. 60 capsule 4   Blood Glucose Monitoring Suppl (ONETOUCH VERIO) w/Device KIT Check glucose once daily. (Patient not taking: Reported on 06/22/2023) 1 kit 0   fenofibrate (TRICOR) 145 MG tablet Take 1 tablet (145 mg total) by mouth daily. 90 tablet 0   glucose blood (ONETOUCH ULTRA) test strip Check blood sugar once a day at at different times (Patient not taking: Reported on 06/22/2023) 50 each 12   glucose blood test strip Check sugar once daily. (Patient not taking: Reported on 06/22/2023) 100 each 12   meloxicam (MOBIC) 15 MG tablet Take 1 tablet (15 mg total) by mouth daily with food for 5 (five) to 7 (seven) days, then as needed. 30 tablet 0   meloxicam (MOBIC) 7.5 MG tablet Take 1 tablet (7.5 mg total) by mouth daily. 10 tablet 0   No current facility-administered medications for this visit.    PHYSICAL EXAM: Vitals:   10/26/23 1100  BP: 132/82  Pulse: 75  Resp: 20  SpO2: 97%  Weight: 190 lb 6.4 oz (86.4 kg)  Height: 5\' 10"  (1.778 m)    Body mass index is 27.32 kg/m.  Wt Readings from Last 3 Encounters:  10/26/23 190 lb 6.4 oz (86.4 kg)  06/22/23 200 lb 3.2 oz (90.8 kg)  04/28/23 198 lb 12.8 oz (90.2 kg)    General: Well developed, well nourished male in no apparent distress.  HEENT: AT/[place], no external lesions.  Eyes: Conjunctiva clear and no icterus. Neck: Neck supple  Lungs: Respirations not labored Neurologic: Alert, oriented, normal speech Extremities / Skin: Dry. No sores or rashes noted.  Psychiatric: Does not appear depressed or anxious  Diabetic Foot Exam - Simple   No data filed    LABS Reviewed Lab  Results  Component Value Date   HGBA1C 6.5 (A) 10/26/2023   HGBA1C 7.2 (H) 06/15/2023   HGBA1C 7.4 (H) 03/17/2023   Lab Results  Component Value Date   FRUCTOSAMINE 234 09/03/2017   Lab Results  Component Value Date   CHOL 111 06/15/2023   HDL 20.00 (L) 06/15/2023   LDLCALC 46 11/19/2022   LDLDIRECT 56.0 06/15/2023   TRIG (H) 06/15/2023    426.0 Triglyceride is over 400; calculations on Lipids are invalid.   CHOLHDL 6 06/15/2023   Lab Results  Component Value Date   MICRALBCREAT 2.4 06/22/2023   MICRALBCREAT 1.6 10/02/2021   Lab Results  Component Value Date   CREATININE 1.08 06/15/2023   Lab Results  Component Value Date   GFR 72.68 06/15/2023    ASSESSMENT / PLAN  1. Uncontrolled type 2 diabetes mellitus with hyperglycemia, without long-term current use of insulin (HCC)   2. Encounter for medication monitoring   3. Hypogonadotropic hypogonadism (HCC)     Diabetes Mellitus type 2, complicated by no known complications. - Diabetic status / severity: Fair control.  Lab Results  Component Value Date   HGBA1C 6.5 (A) 10/26/2023    - Hemoglobin A1c goal : <6.5%  - Medications: No change  I) metformin 1000 mg 2 times a day. II) Ozempic 1 mg weekly. III) Jardiance 25 mg daily.  - Home glucose testing: In the morning fasting and occasionally at bedtime.  Asked to bring the glucometer in the clinic visit.   - Discussed/ Gave Hypoglycemia treatment plan.  #  Consult : not required at this time.   # Annual urine for microalbuminuria/ creatinine ratio, no microalbuminuria currently, continue ACE/ARB.  On losartan. Last  Lab Results  Component Value Date   MICRALBCREAT 2.4 06/22/2023    # Foot check nightly.  # Annual dilated diabetic eye exams.   - Diet: Make healthy diabetic food choices - Life style / activity / exercise: Discussed.  2. Blood pressure  -  BP Readings from Last 1 Encounters:  10/26/23 132/82    - Control is in target.  - No  change in current plans.  3. Lipid status / Hyperlipidemia - Last  Lab Results  Component Value Date   LDLCALC 46 11/19/2022   - Continue atorvastatin 10 mg daily and fenofibrate 145 mg daily. -Will check lipid panel in next follow-up visit.  # Hypogonadotropic hypogonadism -Testosterone level mid normal range.  Continue Jatenzo 158 mg 2 times a day with meals. -Will check total testosterone in next follow-up visit. -In regard to ED problem discussed about various causes, including type 2 diabetes mellitus and hypogonadism.  Patient had tried sildenafil, does not like to try anymore.  He has been following with urology, encouraged to follow-up with urology as well.  Diagnoses and all orders for this visit:  Uncontrolled type 2 diabetes mellitus with hyperglycemia, without long-term current use of insulin (HCC) -     POCT glycosylated hemoglobin (Hb A1C) -     Hemoglobin A1c -     Basic Metabolic Panel (BMET) -     Lipid panel  Encounter for medication monitoring -     Hematocrit  Hypogonadotropic hypogonadism (HCC) -     Testosterone    DISPOSITION Follow up in clinic in 4  months suggested.  Patient prefers to lab at [facility] laboratory locally prior to follow-up visit.  Order placed as above.    All questions answered and patient verbalized understanding of the plan.  Douglas Nasiah Polinsky, MD [facility] Endocrinology [facility] [address] [place], [address] [place], [place] [address] Phone # [phone]  At least part of this note was generated using voice recognition software. Inadvertent word errors may have occurred, which were not recognized during the proofreading process.

## 2023-11-02 ENCOUNTER — Other Ambulatory Visit (HOSPITAL_BASED_OUTPATIENT_CLINIC_OR_DEPARTMENT_OTHER): Payer: Self-pay

## 2023-11-02 ENCOUNTER — Other Ambulatory Visit: Payer: Self-pay

## 2023-11-10 ENCOUNTER — Other Ambulatory Visit (HOSPITAL_BASED_OUTPATIENT_CLINIC_OR_DEPARTMENT_OTHER): Payer: Self-pay

## 2023-11-10 ENCOUNTER — Other Ambulatory Visit: Payer: Self-pay | Admitting: Endocrinology

## 2023-11-10 ENCOUNTER — Other Ambulatory Visit: Payer: Self-pay

## 2023-11-10 DIAGNOSIS — I10 Essential (primary) hypertension: Secondary | ICD-10-CM

## 2023-11-10 MED ORDER — LOSARTAN POTASSIUM 50 MG PO TABS
50.0000 mg | ORAL_TABLET | Freq: Every day | ORAL | 0 refills | Status: DC
  Filled 2023-11-10: qty 30, 30d supply, fill #0
  Filled 2023-12-06: qty 30, 30d supply, fill #1
  Filled 2024-01-12: qty 30, 30d supply, fill #2

## 2023-11-10 NOTE — Telephone Encounter (Signed)
 Medication refill request complete

## 2023-11-30 ENCOUNTER — Other Ambulatory Visit (HOSPITAL_BASED_OUTPATIENT_CLINIC_OR_DEPARTMENT_OTHER): Payer: Self-pay

## 2023-11-30 MED ORDER — PREDNISONE 10 MG PO TABS
ORAL_TABLET | ORAL | 0 refills | Status: AC
  Filled 2023-11-30: qty 21, 6d supply, fill #0

## 2023-12-01 ENCOUNTER — Other Ambulatory Visit: Payer: Self-pay

## 2023-12-01 ENCOUNTER — Other Ambulatory Visit: Payer: Self-pay | Admitting: Endocrinology

## 2023-12-02 ENCOUNTER — Other Ambulatory Visit (HOSPITAL_BASED_OUTPATIENT_CLINIC_OR_DEPARTMENT_OTHER): Payer: Self-pay

## 2023-12-02 ENCOUNTER — Other Ambulatory Visit: Payer: Self-pay

## 2023-12-02 MED ORDER — JATENZO 158 MG PO CAPS
158.0000 mg | ORAL_CAPSULE | Freq: Two times a day (BID) | ORAL | 4 refills | Status: DC
  Filled 2023-12-02: qty 60, 30d supply, fill #0
  Filled 2023-12-30: qty 60, 30d supply, fill #1
  Filled 2024-02-15: qty 60, 30d supply, fill #2
  Filled 2024-03-13: qty 60, 30d supply, fill #3

## 2024-01-05 ENCOUNTER — Other Ambulatory Visit: Payer: Self-pay

## 2024-01-07 ENCOUNTER — Other Ambulatory Visit (HOSPITAL_BASED_OUTPATIENT_CLINIC_OR_DEPARTMENT_OTHER): Payer: Self-pay

## 2024-01-21 ENCOUNTER — Other Ambulatory Visit (HOSPITAL_BASED_OUTPATIENT_CLINIC_OR_DEPARTMENT_OTHER): Payer: Self-pay

## 2024-01-21 ENCOUNTER — Other Ambulatory Visit: Payer: Self-pay | Admitting: Endocrinology

## 2024-01-28 ENCOUNTER — Other Ambulatory Visit (HOSPITAL_BASED_OUTPATIENT_CLINIC_OR_DEPARTMENT_OTHER): Payer: Self-pay

## 2024-01-28 MED ORDER — EMPAGLIFLOZIN 25 MG PO TABS
25.0000 mg | ORAL_TABLET | Freq: Every day | ORAL | 2 refills | Status: DC
  Filled 2024-01-28 – 2024-02-10 (×2): qty 30, 30d supply, fill #0
  Filled 2024-03-13 – 2024-03-14 (×2): qty 30, 30d supply, fill #1

## 2024-01-28 NOTE — Telephone Encounter (Signed)
 Refill request complete

## 2024-02-01 ENCOUNTER — Other Ambulatory Visit: Payer: Self-pay | Admitting: Endocrinology

## 2024-02-01 DIAGNOSIS — I10 Essential (primary) hypertension: Secondary | ICD-10-CM

## 2024-02-02 ENCOUNTER — Other Ambulatory Visit (HOSPITAL_BASED_OUTPATIENT_CLINIC_OR_DEPARTMENT_OTHER): Payer: Self-pay

## 2024-02-02 MED ORDER — LOSARTAN POTASSIUM 50 MG PO TABS
50.0000 mg | ORAL_TABLET | Freq: Every day | ORAL | 4 refills | Status: AC
Start: 2024-02-02 — End: ?
  Filled 2024-02-15: qty 30, 30d supply, fill #0
  Filled 2024-03-13: qty 30, 30d supply, fill #1

## 2024-02-09 ENCOUNTER — Other Ambulatory Visit (HOSPITAL_BASED_OUTPATIENT_CLINIC_OR_DEPARTMENT_OTHER): Payer: Self-pay

## 2024-02-10 ENCOUNTER — Other Ambulatory Visit (HOSPITAL_BASED_OUTPATIENT_CLINIC_OR_DEPARTMENT_OTHER): Payer: Self-pay

## 2024-02-15 ENCOUNTER — Other Ambulatory Visit (HOSPITAL_BASED_OUTPATIENT_CLINIC_OR_DEPARTMENT_OTHER): Payer: Self-pay

## 2024-02-24 ENCOUNTER — Ambulatory Visit: Payer: 59 | Admitting: Endocrinology

## 2024-03-13 ENCOUNTER — Other Ambulatory Visit: Payer: Self-pay | Admitting: Endocrinology

## 2024-03-13 ENCOUNTER — Other Ambulatory Visit (HOSPITAL_BASED_OUTPATIENT_CLINIC_OR_DEPARTMENT_OTHER): Payer: Self-pay

## 2024-03-14 ENCOUNTER — Other Ambulatory Visit (HOSPITAL_BASED_OUTPATIENT_CLINIC_OR_DEPARTMENT_OTHER): Payer: Self-pay

## 2024-03-14 ENCOUNTER — Other Ambulatory Visit: Payer: Self-pay | Admitting: Endocrinology

## 2024-03-14 DIAGNOSIS — E1165 Type 2 diabetes mellitus with hyperglycemia: Secondary | ICD-10-CM

## 2024-03-14 MED ORDER — OZEMPIC (1 MG/DOSE) 4 MG/3ML ~~LOC~~ SOPN
1.0000 mg | PEN_INJECTOR | SUBCUTANEOUS | 2 refills | Status: DC
  Filled 2024-03-14: qty 3, 28d supply, fill #0

## 2024-03-15 ENCOUNTER — Telehealth: Payer: Self-pay

## 2024-03-15 ENCOUNTER — Other Ambulatory Visit (HOSPITAL_COMMUNITY): Payer: Self-pay

## 2024-03-15 NOTE — Telephone Encounter (Signed)
 Pharmacy Patient Advocate Encounter   Received notification from CoverMyMeds that prior authorization for Jatenzo  158MG  capsules is required/requested.   Insurance verification completed.   The patient is insured through [facility] .   Per test claim: PA required; PA started via CoverMyMeds. KEY [id number] . Waiting for clinical questions to populate.

## 2024-03-20 NOTE — Telephone Encounter (Signed)
 Clinical info submitted.

## 2024-03-21 ENCOUNTER — Other Ambulatory Visit (HOSPITAL_BASED_OUTPATIENT_CLINIC_OR_DEPARTMENT_OTHER): Payer: Self-pay

## 2024-03-21 NOTE — Telephone Encounter (Signed)
 Provider will have to review when he returns to office

## 2024-03-22 NOTE — Telephone Encounter (Signed)
 Pharmacy Patient Advocate Encounter  Received notification from [employer] that Prior Authorization for Jatenzo  158MG  capsules  has been DENIED.  Full denial letter will be uploaded to the media tab. See denial reason below.

## 2024-06-05 ENCOUNTER — Ambulatory Visit (INDEPENDENT_AMBULATORY_CARE_PROVIDER_SITE_OTHER): Admitting: Endocrinology

## 2024-06-05 ENCOUNTER — Other Ambulatory Visit

## 2024-06-05 ENCOUNTER — Encounter: Payer: Self-pay | Admitting: Endocrinology

## 2024-06-05 VITALS — BP 128/80 | HR 76 | Resp 20 | Ht 70.0 in | Wt 198.0 lb

## 2024-06-05 DIAGNOSIS — E1165 Type 2 diabetes mellitus with hyperglycemia: Secondary | ICD-10-CM

## 2024-06-05 DIAGNOSIS — Z7984 Long term (current) use of oral hypoglycemic drugs: Secondary | ICD-10-CM

## 2024-06-05 DIAGNOSIS — Z7985 Long-term (current) use of injectable non-insulin antidiabetic drugs: Secondary | ICD-10-CM | POA: Diagnosis not present

## 2024-06-05 DIAGNOSIS — E230 Hypopituitarism: Secondary | ICD-10-CM

## 2024-06-05 MED ORDER — TESTOSTERONE 20.25 MG/1.25GM (1.62%) TD GEL: TRANSDERMAL | 1 refills | Status: AC

## 2024-06-05 MED ORDER — OZEMPIC (2 MG/DOSE) 8 MG/3ML ~~LOC~~ SOPN: 2.0000 mg | PEN_INJECTOR | SUBCUTANEOUS | 3 refills | Status: DC

## 2024-06-05 MED ORDER — METFORMIN HCL 1000 MG PO TABS: 1000.0000 mg | ORAL_TABLET | Freq: Two times a day (BID) | ORAL | 3 refills | Status: AC

## 2024-06-05 MED ORDER — GLIMEPIRIDE 4 MG PO TABS: 4.0000 mg | ORAL_TABLET | Freq: Every day | ORAL | 3 refills | Status: AC

## 2024-06-05 NOTE — Progress Notes (Unsigned)
 Outpatient Endocrinology Note Douglas Karanvir Balderston, MD  06/07/24  Patient's Name: Douglas [name]    DOB: Dec 16, 1960    MRN: [id number]                                                    REASON OF VISIT: Follow up for type 2 diabetes mellitus / hypogonadism  PCP: Medicine, [facility]  HISTORY OF PRESENT ILLNESS:   Douglas [name] is a 63 y.o. old male with past medical history listed below, is here for follow up of type 2 diabetes mellitus / hypogonadism.   Pertinent Diabetes History: Patient was previously and last time seen by Dr. Von in July 2024.  Patient was diagnosed with type 2 diabetes mellitus in 2016 based on records, at the onset hemoglobin A1c was 12%.  He was initially managed with Actos  and metformin , Invokana  was added in August 2018 and Ozempic  was started in November 2018.  He has fair control of type 2 diabetes mellitus.  Chronic Diabetes Complications : Retinopathy: no. Last ophthalmology exam was done on annually, reportedly.  Nephropathy: no, on losartan  Peripheral neuropathy: no Coronary artery disease: no Stroke: no  Relevant comorbidities and cardiovascular risk factors: Obesity: no Body mass index is 28.41 kg/m.  Hypertension: yes Hyperlipidemia. Yes, on statin and fenofibrate .  Current / Home Diabetic regimen includes: Metformin  1000 mg 2 times a day.  Not taking. Ozempic  1 mg weekly. Jardiance  25 mg daily.  Not taking  Prior diabetic medications: Actos , Farxiga , Invokana .  Glycemic data:   No glucometer data to review.  He forgot to bring glucometer in the clinic today.  Hypoglycemia: Patient has no hypoglycemic episodes. Patient has hypoglycemia awareness.  Factors modifying glucose control: 1.  Diabetic diet assessment: 3 meals a day.  2.  Staying active or exercising:   3.  Medication compliance: compliant most of the time.  # Hypogonadotropic hypogonadism -Diagnosed in 2009. He was apparently initially seen by  his psychiatrist and was having difficulties with fatigue, agoraphobia and decreased libido as well as sexual dysfunction. He was told that his testosterone  level was significantly low and was given testosterone  supplements probably with AndroGel .Subsequently the patient has been treated by various physicians including the urologist with testosterone  supplements, mostly AndroGel . He thinks that his testosterone  levels have never been normal with AndroGel  although he thinks that he felt a little better with taking this from his urologist who called him to apply it on his abdomen. He was also tried on monthly injections for about 3 months without consistent benefit also. In 2016 he was given Fortesta  again without any benefit. He had baseline complaints of fatigue, decreased motivation, decreased libido and erectile dysfunction from hypogonadism. Initial evaluation showed low free testosterone  of 4.5 and normal LH  He had been on clomiphene  25 mg daily after his initial consultation in 09/2015. Because of continued fatigue, decreased libido and testosterone  levels being on the low end of normal he was switched to ANDROGEL  3 pumps daily in 11/2017. He was having difficulty applying this in the mornings because of not having time before going to work. With this his testosterone  levels could be consistently low. He was switched to Androderm , 8 mg daily in October 2020. With the treatment he had improved energy level and less weakness; however because of skin irritation this was discontinued. He  was given a prescription for Natesto  but he tends to have nasal congestion and blockage with this and this was stopped.  He was later switched to Jatenzo .  He was taking JATENZO  158 mg twice a day after prior authorization.  Interval history  Hemoglobin A1c was worsening to 7.5% last month on September 2 checked in outside hospital [facility].  Patient has not been taking Jardiance  due to high cost.  He is also not taking  metformin .  Patient is going to run out of the Ozempic  and has 1 more dose left.  He is currently taking Ozempic  1 mg weekly.  No glucose data to review.  He recently had changed to Medicare and some of his medications are not covered and are expensive.  He has not been taking Jatenzo  for several weeks, not covered by his new insurance and very expensive.  Would like to try other forms of testosterone  therapy he prefers topical.  Testosterone  level was low checked in September 2, total was 151 and free was 3.7.  Reviewed in Care Everywhere.  No other complaints today.  REVIEW OF SYSTEMS As per history of present illness.   PAST MEDICAL HISTORY: Past Medical History:  Diagnosis Date   Acrophobia    Occasionally requires xanax  when he is required to work in high places as part of his occupation.   Adenomatous colon polyp 05/07/06; 05/2012   Repeat colonoscopy 05/17/2012 showed 2 small polyps that were removed--path showed Hyperplastic-not adenomatous.  Still needs repeat 5 yrs.   Angioedema of lips    saw allergist 11/2011   Asthmatic bronchitis , chronic (HCC) 01/11/2012   Question occupational asthma with no specific agents identified. He does notice that some areas of his job are particularly dusty and dirty. Currently he is well controlled using Symbicort  through an AeroChamber and rarely needing his rescue inhaler. This would be an acceptable long-term status. Elevated nonspecific total IgE and peripheral eosinophilia to suggest an atopic trigger with sensitization to something Consider Daliresp for future use if needed    Chronic fatigue    +daytime somnolence   Chronic recurrent sinusitis 02/28/2012   Depression    Prozac in the past not much help.  Spontaneously resolved.   Diabetes mellitus without complication (HCC)    managed by Dr. Von (endo)--this is accurate as of 02/2018, when pt last saw Dr. von.   Diverticulosis    Generalized anxiety disorder 08/11/2012   Hemorrhoids     ext and int   Hyperlipemia, mixed    elev trigs with low HDL. Fish oil OTC.  Statin started in Fall 2018.  Fenofibrate  started 07/2018   Hypertension 2017   Hypogonadotropic hypogonadism 04/08/2011   As of 2017/18/19, Dr. Von (endo) is following: he started clomiph but no better at f/u 11/2016.  Pt denied any improvemt,though testost improved.  11/2017 Clomiphene  d/c'd-topical testo started. His hypogonadism is due to insulin  resistance syndrome.  As of 08/2017 endo f/u: pt staying on clomiphene  (no testost) and is feeling some improved.  No imp 11/2017 f/u on topical testost (off clom)   Insomnia    Internal hemorrhoids, bleeding and sicharge 05/29/2013   OSA on CPAP    setting changed to 6 cm H20 (fixed) 10/2016 by pulm.   Rotator cuff tear 2011   [facility]: conservative mgmt--symptoms stable as of 04/2011.    PAST SURGICAL HISTORY: Past Surgical History:  Procedure Laterality Date   adnoids     CARDIOVASCULAR STRESS TEST  Normal stress nuclear study, EF normal. +Hypertensive bp response--subsequent 24H ambulatory BP monitoring confirmed HTN.   COLONOSCOPY  05/2006, 05/2012   HEMORRHOID BANDING     POLYPECTOMY     Adenomatous 2007; hyperplastic 2013.  Repeat 5 yrs.   TRANSTHORACIC ECHOCARDIOGRAM  01/30/16   Normal except mild aortic dilatation   WISDOM TOOTH EXTRACTION      ALLERGIES: Allergies  Allergen Reactions   Molds & Smuts Other (See Comments)    FAMILY HISTORY:  Family History  Problem Relation Age of Onset   Hypertension Mother    Diabetes Mother    CVA Mother    Hypertension Father    Diabetes Father    Dementia Father    Heart disease Father    CAD Father    Colon cancer Neg Hx    Stomach cancer Neg Hx    Rectal cancer Neg Hx     SOCIAL HISTORY: Social History   Socioeconomic History   Marital status: Married    Spouse name: Not on file   Number of children: 1   Years of education: Not on file   Highest education level: Not on file  Occupational  History   Occupation: Psychologist, prison and probation services: [employer]  Tobacco Use   Smoking status: Never   Smokeless tobacco: Never  Vaping Use   Vaping status: Never Used  Substance and Sexual Activity   Alcohol use: Yes    Alcohol/week: 0.0 standard drinks of alcohol    Comment: rarely   Drug use: No   Sexual activity: Not on file  Other Topics Concern   Not on file  Social History Narrative   Married, one 29 y/o daughter, wife is [place].   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   No T/A/Ds.  Works out regularly.   Social Drivers of Corporate investment banker Strain: Low Risk  (05/02/2024)   Received from [facility]   Overall Financial Resource Strain (CARDIA)    How hard is it for you to pay for the very basics like food, housing, medical care, and heating?: Not hard at all  Food Insecurity: No Food Insecurity (05/02/2024)   Received from [facility]   Hunger Vital Sign    Within the past 12 months, you worried that your food would run out before you got the money to buy more.: Never true    Within the past 12 months, the food you bought just didn't last and you didn't have money to get more.: Never true  Transportation Needs: No Transportation Needs (05/02/2024)   Received from [facility] - Transportation    In the past 12 months, has lack of transportation kept you from medical appointments or from getting medications?: No    In the past 12 months, has lack of transportation kept you from meetings, work, or from getting things needed for daily living?: No  Physical Activity: Insufficiently Active (05/02/2024)   Received from [facility]   Exercise Vital Sign    On average, how many days per week do you engage in moderate to strenuous exercise (like a brisk walk)?: 2 days    On average, how many minutes do you engage in exercise at this level?: 20 min  Stress: Stress Concern  Present (05/02/2024)   Received from [facility] - Occupational Stress Questionnaire  Do you feel stress - tense, restless, nervous, or anxious, or unable to sleep at night because your mind is troubled all the time - these days?: Very much  Social Connections: Somewhat Isolated (05/02/2024)   Received from [facility]   Social Network    How would you rate your social network (family, work, friends)?: Restricted participation with some degree of social isolation    MEDICATIONS:  Current Outpatient Medications  Medication Sig Dispense Refill   glimepiride (AMARYL) 4 MG tablet Take 1 tablet (4 mg total) by mouth daily before breakfast. 90 tablet 3   Semaglutide , 2 MG/DOSE, (OZEMPIC , 2 MG/DOSE,) 8 MG/3ML SOPN Inject 2 mg into the skin once a week. 9 mL 3   Testosterone  (ANDROGEL ) 20.25 MG/1.25GM (1.62%) GEL Use 2 pump on each shoulder daily in the morning. 150 g 1   atorvastatin  (LIPITOR) 10 MG tablet Take 1 tablet (10 mg total) by mouth daily. 90 tablet 3   fenofibrate  (TRICOR ) 145 MG tablet Take 1 tablet (145 mg total) by mouth daily. 90 tablet 0   glucose blood (ONETOUCH ULTRA) test strip Check blood sugar once a day at at different times (Patient not taking: Reported on 06/22/2023) 50 each 12   glucose blood test strip Check sugar once daily. (Patient not taking: Reported on 06/22/2023) 100 each 12   losartan  (COZAAR ) 50 MG tablet Take 1 tablet (50 mg total) by mouth daily. (Patient not taking: Reported on 06/05/2024) 90 tablet 4   meloxicam  (MOBIC ) 15 MG tablet Take 1 tablet (15 mg total) by mouth daily with food for 5 (five) to 7 (seven) days, then as needed. 30 tablet 0   meloxicam  (MOBIC ) 7.5 MG tablet Take 1 tablet (7.5 mg total) by mouth daily. 10 tablet 0   metFORMIN  (GLUCOPHAGE ) 1000 MG tablet Take 1 tablet (1,000 mg total) by mouth 2 (two) times daily with a meal. 180 tablet 3   Testosterone  Undecanoate (JATENZO ) 158 MG CAPS Take 1  capsule (158 mg total) by mouth 2 (two) times daily after a meal. *Please make follow-up appointment.* (Patient not taking: Reported on 06/05/2024) 60 capsule 4   No current facility-administered medications for this visit.    PHYSICAL EXAM: Vitals:   06/05/24 0957  BP: 128/80  Pulse: 76  Resp: 20  SpO2: 97%  Weight: 198 lb (89.8 kg)  Height: 5' 10 (1.778 m)    Body mass index is 28.41 kg/m.  Wt Readings from Last 3 Encounters:  06/05/24 198 lb (89.8 kg)  10/26/23 190 lb 6.4 oz (86.4 kg)  06/22/23 200 lb 3.2 oz (90.8 kg)    General: Well developed, well nourished male in no apparent distress.  HEENT: AT/Regino Ramirez, no external lesions.  Eyes: Conjunctiva clear and no icterus. Neck: Neck supple  Lungs: Respirations not labored Neurologic: Alert, oriented, normal speech Extremities / Skin: Dry.   Psychiatric: Does not appear depressed or anxious  Diabetic Foot Exam - Simple   No data filed    LABS Reviewed Lab Results  Component Value Date   HGBA1C 6.5 (A) 10/26/2023   HGBA1C 7.2 (H) 06/15/2023   HGBA1C 7.4 (H) 03/17/2023   Lab Results  Component Value Date   FRUCTOSAMINE 234 09/03/2017   Lab Results  Component Value Date   CHOL 111 06/15/2023   HDL 20.00 (L) 06/15/2023   LDLCALC 46 11/19/2022   LDLDIRECT 56.0 06/15/2023   TRIG (H) 06/15/2023    426.0 Triglyceride is over 400; calculations on Lipids are invalid.  CHOLHDL 6 06/15/2023   Lab Results  Component Value Date   MICRALBCREAT 8 06/05/2024    Lab Results  Component Value Date   CREATININE 1.08 06/15/2023   Lab Results  Component Value Date   GFR 72.68 06/15/2023    ASSESSMENT / PLAN  1. Uncontrolled type 2 diabetes mellitus with hyperglycemia, without long-term current use of insulin  (HCC)   2. Hypogonadotropic hypogonadism      Diabetes Mellitus type 2, complicated by no other known complications. - Diabetic status / severity: Uncontrolled. Lab Results  Component Value Date   HGBA1C 6.5  (A) 10/26/2023    - Hemoglobin A1c goal : <6.5%  Recent hemoglobin A1c was 7.5% last month at [facility] health.  - Medications: No change  I) restart metformin  1000 mg 2 times a day. II) increase Ozempic  from 1 mg to 2 mg weekly.  Check cost and coverage with the pharmacy. III) does not want to take Jardiance  due to high cost. IV) start glimepiride 4 mg daily.  - Home glucose testing: In the morning fasting and occasionally at bedtime.  Asked to bring the glucometer in the clinic visit.   - Discussed/ Gave Hypoglycemia treatment plan.  # Consult : not required at this time.   # Annual urine for microalbuminuria/ creatinine ratio, no microalbuminuria currently, continue ACE/ARB.  On losartan . Last  Lab Results  Component Value Date   MICRALBCREAT 8 06/05/2024     # Foot check nightly.  # Annual dilated diabetic eye exams.   - Diet: Make healthy diabetic food choices - Life style / activity / exercise: Discussed.  2. Blood pressure  -  BP Readings from Last 1 Encounters:  06/05/24 128/80    - Control is in target.  - No change in current plans.  3. Lipid status / Hyperlipidemia - Last  Lab Results  Component Value Date   LDLCALC 46 11/19/2022   - Continue atorvastatin  10 mg daily and fenofibrate  145 mg daily. --LDL 97 in May 12, 2024.  Reviewed at Care Everywhere.  # Hypogonadotropic hypogonadism - Was on Jatenzo  158 mg 2 times a day with meals.  Currently not taking due to high cost. -Start AndroGel  1.62% 2 pumps daily. -In regard to ED problem discussed about various causes, including type 2 diabetes mellitus and hypogonadism.  Patient had tried sildenafil, does not like to try anymore.  He has been following with urology, encouraged to follow-up with urology as well.  Diagnoses and all orders for this visit:  Uncontrolled type 2 diabetes mellitus with hyperglycemia, without long-term current use of insulin  (HCC) -     metFORMIN  (GLUCOPHAGE ) 1000 MG  tablet; Take 1 tablet (1,000 mg total) by mouth 2 (two) times daily with a meal. -     glimepiride (AMARYL) 4 MG tablet; Take 1 tablet (4 mg total) by mouth daily before breakfast. -     Semaglutide , 2 MG/DOSE, (OZEMPIC , 2 MG/DOSE,) 8 MG/3ML SOPN; Inject 2 mg into the skin once a week. -     Microalbumin / creatinine urine ratio  Hypogonadotropic hypogonadism -     Testosterone  (ANDROGEL ) 20.25 MG/1.25GM (1.62%) GEL; Use 2 pump on each shoulder daily in the morning.   DISPOSITION Follow up in clinic in 3  months suggested.  Patient prefers to lab at [facility] laboratory locally prior to follow-up visit.     All questions answered and patient verbalized understanding of the plan.  Douglas Hayleen Clinkscales, MD [facility] [address], [address] [place], [place] [address] Phone # [phone]  At least part of this note was generated using voice recognition software. Inadvertent word errors may have occurred, which were not recognized during the proofreading process.

## 2024-06-05 NOTE — Patient Instructions (Signed)
 Ozempic  2 mg weekly, check cost and aternative with pharmacy.  Restart metformin .  Start glimepiride 4mg  daily.

## 2024-06-06 ENCOUNTER — Encounter: Payer: Self-pay | Admitting: Endocrinology

## 2024-06-06 ENCOUNTER — Ambulatory Visit: Payer: Self-pay | Admitting: Endocrinology

## 2024-06-06 LAB — MICROALBUMIN / CREATININE URINE RATIO
Creatinine, Urine: 83 mg/dL (ref 20–320)
Microalb Creat Ratio: 8 mg/g{creat} (ref <30)
Microalb, Ur: 0.7 mg/dL

## 2024-06-07 ENCOUNTER — Encounter: Payer: Self-pay | Admitting: Endocrinology

## 2024-06-30 ENCOUNTER — Encounter: Payer: Self-pay | Admitting: Endocrinology

## 2024-06-30 DIAGNOSIS — E230 Hypopituitarism: Secondary | ICD-10-CM

## 2024-06-30 MED ORDER — TESTOSTERONE CYPIONATE 200 MG/ML IM SOLN: 200.0000 mg | Freq: Once | INTRAMUSCULAR | 0 refills | Status: DC

## 2024-06-30 MED ORDER — "SYRINGE 23G X 1"" 3 ML MISC": 1 refills | Status: DC

## 2024-06-30 NOTE — Telephone Encounter (Signed)
 I sent prescription for testosterone  cypionate 200 mg IM every 2 weeks.  Once you are able to refill from the pharmacy call our clinic we need to set up nurse visit for the testosterone  injection in the clinic.  Usually first injection you do in the clinic and after that you may be able to learn the injection by your self and do at home.  If you cannot do injection at home there is option to do  injection every 2 weeks in our clinic as well.  Khaliyah Northrop, MD [facility] [address] [place], [address] [place], [place] [address] Phone # [phone]

## 2024-09-05 ENCOUNTER — Ambulatory Visit (INDEPENDENT_AMBULATORY_CARE_PROVIDER_SITE_OTHER): Admitting: Endocrinology

## 2024-09-05 ENCOUNTER — Ambulatory Visit: Payer: Self-pay | Admitting: Endocrinology

## 2024-09-05 ENCOUNTER — Encounter: Payer: Self-pay | Admitting: Endocrinology

## 2024-09-05 VITALS — BP 136/70 | HR 67 | Resp 16 | Ht 70.0 in | Wt 205.8 lb

## 2024-09-05 DIAGNOSIS — Z7984 Long term (current) use of oral hypoglycemic drugs: Secondary | ICD-10-CM

## 2024-09-05 DIAGNOSIS — E1165 Type 2 diabetes mellitus with hyperglycemia: Secondary | ICD-10-CM

## 2024-09-05 DIAGNOSIS — Z7985 Long-term (current) use of injectable non-insulin antidiabetic drugs: Secondary | ICD-10-CM | POA: Diagnosis not present

## 2024-09-05 DIAGNOSIS — E230 Hypopituitarism: Secondary | ICD-10-CM

## 2024-09-05 LAB — POCT GLYCOSYLATED HEMOGLOBIN (HGB A1C): Hemoglobin A1C: 9 % — AB (ref 4.0–5.6)

## 2024-09-05 MED ORDER — EMPAGLIFLOZIN 25 MG PO TABS: 25.0000 mg | ORAL_TABLET | Freq: Every day | ORAL | 3 refills | Status: AC

## 2024-09-05 MED ORDER — "SYRINGE 23G X 1"" 3 ML MISC": 1 refills | Status: AC

## 2024-09-05 MED ORDER — TESTOSTERONE CYPIONATE 200 MG/ML IM SOLN: 200.0000 mg | INTRAMUSCULAR | 1 refills | Status: AC

## 2024-09-05 MED ORDER — TESTOSTERONE CYPIONATE 200 MG/ML IM SOLN
200.0000 mg | INTRAMUSCULAR | Status: AC
  Administered 2024-09-05: 200 mg via INTRAMUSCULAR

## 2024-09-05 MED ORDER — OZEMPIC (0.25 OR 0.5 MG/DOSE) 2 MG/3ML ~~LOC~~ SOPN: 0.2500 mg | PEN_INJECTOR | SUBCUTANEOUS | 4 refills | Status: AC

## 2024-09-05 NOTE — Progress Notes (Signed)
 After obtaining consent, and per orders of Dr. Mercie, injection of Testosterone  given by Dietrich LOISE Lax. Patient instructed to remain in clinic for 20 minutes afterwards, and to report any adverse reaction to me immediately.

## 2024-09-05 NOTE — Progress Notes (Signed)
 "  Outpatient Endocrinology Note Uzziel Russey, MD  09/05/2024  Patient's Name: Douglas [name]    DOB: September 30, 1960    MRN: [id number]                                                    REASON OF VISIT: Follow up for type 2 diabetes mellitus / hypogonadism  PCP: Medicine, [facility]  HISTORY OF PRESENT ILLNESS:   Douglas [name] is a 64 y.o. old male with past medical history listed below, is here for follow up of type 2 diabetes mellitus / hypogonadism.   Pertinent Diabetes History: Patient was previously and last time seen by Dr. Von in July 2024.  Patient was diagnosed with type 2 diabetes mellitus in 2016 based on records, at the onset hemoglobin A1c was 12%.  He was initially managed with Actos  and metformin , Invokana  was added in August 2018 and Ozempic  was started in November 2018.  He has fair control of type 2 diabetes mellitus.  Chronic Diabetes Complications : Retinopathy: no. Last ophthalmology exam was done on annually, reportedly.  Nephropathy: no, on losartan  Peripheral neuropathy: no Coronary artery disease: no Stroke: no  Relevant comorbidities and cardiovascular risk factors: Obesity: no Body mass index is 29.53 kg/m.  Hypertension: yes Hyperlipidemia. Yes, on statin and fenofibrate .  Current / Home Diabetic regimen includes: Metformin  1000 mg 2 times a day.  Not taking. Ozempic  2 mg weekly.  Not taking for several months. Jardiance  25 mg daily.  Not taking for several months. Glimepiride  4 mg daily.  Prior diabetic medications: Actos , Farxiga , Invokana .  Glycemic data:   No glucometer data to review.  He has not been checking blood sugar lately.  Hypoglycemia: Patient has no hypoglycemic episodes. Patient has hypoglycemia awareness.  Factors modifying glucose control: 1.  Diabetic diet assessment: 3 meals a day.  2.  Staying active or exercising:   3.  Medication compliance: compliant most of the time.  # Hypogonadotropic  hypogonadism -Diagnosed in 2009. He was apparently initially seen by his psychiatrist and was having difficulties with fatigue, agoraphobia and decreased libido as well as sexual dysfunction. He was told that his testosterone  level was significantly low and was given testosterone  supplements probably with AndroGel .Subsequently the patient has been treated by various physicians including the urologist with testosterone  supplements, mostly AndroGel . He thinks that his testosterone  levels have never been normal with AndroGel  although he thinks that he felt a little better with taking this from his urologist who called him to apply it on his abdomen. He was also tried on monthly injections for about 3 months without consistent benefit also. In 2016 he was given Fortesta  again without any benefit. He had baseline complaints of fatigue, decreased motivation, decreased libido and erectile dysfunction from hypogonadism. Initial evaluation showed low free testosterone  of 4.5 and normal LH  He had been on clomiphene  25 mg daily after his initial consultation in 09/2015. Because of continued fatigue, decreased libido and testosterone  levels being on the low end of normal he was switched to ANDROGEL  3 pumps daily in 11/2017. He was having difficulty applying this in the mornings because of not having time before going to work. With this his testosterone  levels could be consistently low. He was switched to Androderm , 8 mg daily in October 2020. With the treatment he had improved energy  level and less weakness; however because of skin irritation this was discontinued. He was given a prescription for Natesto  but he tends to have nasal congestion and blockage with this and this was stopped.  He was later switched to Jatenzo .  He was taking JATENZO  158 mg twice a day after prior authorization.  Later do not cover, has not been taking from mid of 2025.  Switch to AndroGel  which was also not covered by medical insurance.  Later  switched to testosterone  injection in October 2025.  Interval history  Hemoglobin A1c 9% today, worsening diabetes control from 7.5% in September 2025.  He has only been taking metformin  and glimepiride .  Not taking Ozempic  and Jardiance  for several months.  High cost, not covered by medical insurance.  He has also not been on testosterone .  He was able to refill testosterone  cypionate however he has not done injection.  No glucose data to review.  He has not been checking blood sugar lately.  Testosterone  level was low checked in September 2, total was 151 and free was 3.7.  Reviewed in Care Everywhere.  No other complaints today.  He reports he has no new medical insurance from this month.  REVIEW OF SYSTEMS As per history of present illness.   PAST MEDICAL HISTORY: Past Medical History:  Diagnosis Date   Acrophobia    Occasionally requires xanax  when he is required to work in high places as part of his occupation.   Adenomatous colon polyp 05/07/06; 05/2012   Repeat colonoscopy 05/17/2012 showed 2 small polyps that were removed--path showed Hyperplastic-not adenomatous.  Still needs repeat 5 yrs.   Angioedema of lips    saw allergist 11/2011   Asthmatic bronchitis , chronic (HCC) 01/11/2012   Question occupational asthma with no specific agents identified. He does notice that some areas of his job are particularly dusty and dirty. Currently he is well controlled using Symbicort  through an AeroChamber and rarely needing his rescue inhaler. This would be an acceptable long-term status. Elevated nonspecific total IgE and peripheral eosinophilia to suggest an atopic trigger with sensitization to something Consider Daliresp for future use if needed    Chronic fatigue    +daytime somnolence   Chronic recurrent sinusitis 02/28/2012   Depression    Prozac in the past not much help.  Spontaneously resolved.   Diabetes mellitus without complication (HCC)    managed by Dr. Von (endo)--this is  accurate as of 02/2018, when pt last saw Dr. von.   Diverticulosis    Generalized anxiety disorder 08/11/2012   Hemorrhoids    ext and int   Hyperlipemia, mixed    elev trigs with low HDL. Fish oil OTC.  Statin started in Fall 2018.  Fenofibrate  started 07/2018   Hypertension 2017   Hypogonadotropic hypogonadism 04/08/2011   As of 2017/18/19, Dr. Von (endo) is following: he started clomiph but no better at f/u 11/2016.  Pt denied any improvemt,though testost improved.  11/2017 Clomiphene  d/c'd-topical testo started. His hypogonadism is due to insulin  resistance syndrome.  As of 08/2017 endo f/u: pt staying on clomiphene  (no testost) and is feeling some improved.  No imp 11/2017 f/u on topical testost (off clom)   Insomnia    Internal hemorrhoids, bleeding and sicharge 05/29/2013   OSA on CPAP    setting changed to 6 cm H20 (fixed) 10/2016 by pulm.   Rotator cuff tear 2011   [facility]: conservative mgmt--symptoms stable as of 04/2011.    PAST SURGICAL HISTORY: Past Surgical  History:  Procedure Laterality Date   adnoids     CARDIOVASCULAR STRESS TEST     Normal stress nuclear study, EF normal. +Hypertensive bp response--subsequent 24H ambulatory BP monitoring confirmed HTN.   COLONOSCOPY  05/2006, 05/2012   HEMORRHOID BANDING     POLYPECTOMY     Adenomatous 2007; hyperplastic 2013.  Repeat 5 yrs.   TRANSTHORACIC ECHOCARDIOGRAM  01/30/16   Normal except mild aortic dilatation   WISDOM TOOTH EXTRACTION      ALLERGIES: Allergies  Allergen Reactions   Molds & Smuts Other (See Comments)    FAMILY HISTORY:  Family History  Problem Relation Age of Onset   Hypertension Mother    Diabetes Mother    CVA Mother    Hypertension Father    Diabetes Father    Dementia Father    Heart disease Father    CAD Father    Colon cancer Neg Hx    Stomach cancer Neg Hx    Rectal cancer Neg Hx     SOCIAL HISTORY: Social History   Socioeconomic History   Marital status: Married    Spouse  name: Not on file   Number of children: 1   Years of education: Not on file   Highest education level: Not on file  Occupational History   Occupation: Psychologist, [employer]: [employer]  Tobacco Use   Smoking status: Never   Smokeless tobacco: Never  Vaping Use   Vaping status: Never Used  Substance and Sexual Activity   Alcohol use: Yes    Alcohol/week: 0.0 standard drinks of alcohol    Comment: rarely   Drug use: No   Sexual activity: Not on file  Other Topics Concern   Not on file  Social History Narrative   Married, one 60 y/o daughter, wife is Thai.   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   No T/A/Ds.  Works out regularly.   Social Drivers of Health   Tobacco Use: Low Risk (09/05/2024)   Patient History    Smoking Tobacco Use: Never    Smokeless Tobacco Use: Never    Passive Exposure: Not on file  Financial Resource Strain: Low Risk (05/02/2024)   [facility]   Overall Financial Resource Strain (CARDIA)    How hard is it for you to pay for the very basics like food, housing, medical care, and heating?: Not hard at all  Food Insecurity: No Food Insecurity (05/02/2024)   Received from [facility]   Epic    Within the past 12 months, you worried that your food would run out before you got the money to buy more.: Never true    Within the past 12 months, the food you bought just didn't last and you didn't have money to get more.: Never true  Transportation Needs: No Transportation Needs (05/02/2024)   Received from [facility]    In the past 12 months, has lack of transportation kept you from medical appointments or from getting medications?: No    In the past 12 months, has lack of transportation kept you from meetings, work, or from getting things needed for daily living?: No  Physical Activity: Insufficiently Active (05/02/2024)   Received from [facility]   Exercise Vital Sign    On average, how many days per week do you engage in moderate to strenuous exercise (like a  brisk walk)?: 2 days    On average, how many minutes do you engage in exercise at this level?: 20 min  Stress: Stress Concern Present (05/02/2024)   Received from [facility] - Occupational Stress Questionnaire    Do you feel stress - tense, restless, nervous, or anxious, or unable to sleep at night because your mind is troubled all the time - these days?: Very much  Social Connections: Somewhat Isolated (05/02/2024)   Received from [facility]   Social Network    How would you rate your social network (family, work, friends)?: Restricted participation with some degree of social isolation  Depression (PHQ2-9): Not on file  Alcohol Screen: Not on file  Housing: Low Risk (05/02/2024)   Received from [facility]    In the last 12 months, was there a time when you were not able to pay the mortgage or rent on time?: No    In the past 12 months, how many times have you moved where you were living?: 0    At any time in the past 12 months, were you homeless or living in a shelter (including now)?: No  Utilities: Not At Risk (05/02/2024)   Received from [facility]    In the past 12 months has the electric, gas, oil, or water company threatened to shut off services in your home?: No  Health Literacy: Not on file    MEDICATIONS:  Current Outpatient Medications  Medication Sig Dispense Refill   atorvastatin  (LIPITOR) 10 MG tablet Take 1 tablet (10 mg total) by mouth daily. 90 tablet 3   empagliflozin  (JARDIANCE ) 25 MG TABS tablet Take 1 tablet (25 mg total) by mouth daily before breakfast. 90 tablet 3   glimepiride  (AMARYL ) 4 MG tablet Take 1 tablet (4 mg total) by mouth daily before breakfast. 90 tablet 3   losartan  (COZAAR ) 50 MG tablet Take 1 tablet (50 mg total) by mouth daily. 90 tablet 4   metFORMIN   (GLUCOPHAGE ) 1000 MG tablet Take 1 tablet (1,000 mg total) by mouth 2 (two) times daily with a meal. 180 tablet 3   Semaglutide ,0.25 or 0.5MG /DOS, (OZEMPIC , 0.25 OR 0.5 MG/DOSE,) 2 MG/3ML SOPN Inject 0.25 mg into the skin once a week. And after 4 weeks increase to 0.5 mg weekly. 3 mL 4   Testosterone  (ANDROGEL ) 20.25 MG/1.25GM (1.62%) GEL Use 2 pump on each shoulder daily in the morning. 150 g 1   Syringe/Needle, Disp, (SYRINGE 3CC/23GX1) 23G X 1 3 ML MISC Use once every 2 weeks to inject testosterone  50 each 1   testosterone  cypionate (DEPOTESTOSTERONE CYPIONATE) 200 MG/ML injection Inject 1 mL (200 mg total) into the muscle every 14 (fourteen) days. 10 mL 1   Current Facility-Administered Medications  Medication Dose Route Frequency Provider Last Rate Last Admin   testosterone  cypionate (DEPOTESTOSTERONE CYPIONATE) injection 200 mg  200 mg Intramuscular Q14 Days Salahuddin Arismendez, MD   200 mg at 09/05/24 1039    PHYSICAL EXAM: Vitals:   09/05/24 0955 09/05/24 0956  BP: (!) 152/70 136/70  Pulse: 67   Resp: 16   SpO2: 96%   Weight: 205 lb 12.8 oz (93.4 kg)   Height: 5' 10 (1.778 m)     Body mass index is 29.53 kg/m.  Wt Readings from Last 3 Encounters:  09/05/24 205 lb 12.8 oz (93.4 kg)  06/05/24 198 lb (89.8 kg)  10/26/23 190 lb 6.4 oz (86.4 kg)  General: Well developed, well nourished male in no apparent distress.  HEENT: AT/[place], no external lesions.  Eyes: Conjunctiva clear and no icterus. Neck: Neck supple  Lungs: Respirations not labored Neurologic: Alert, oriented, normal speech Extremities / Skin: Dry.   Psychiatric: Does not appear depressed or anxious  Diabetic Foot Exam - Simple   No data filed    LABS Reviewed Lab Results  Component Value Date   HGBA1C 9.0 (A) 09/05/2024   HGBA1C 6.5 (A) 10/26/2023   HGBA1C 7.2 (H) 06/15/2023   Lab Results  Component Value Date   FRUCTOSAMINE 234 09/03/2017   Lab Results  Component Value Date   CHOL 111 06/15/2023    HDL 20.00 (L) 06/15/2023   LDLCALC 46 11/19/2022   LDLDIRECT 56.0 06/15/2023   TRIG (H) 06/15/2023    426.0 Triglyceride is over 400; calculations on Lipids are invalid.   CHOLHDL 6 06/15/2023   Lab Results  Component Value Date   MICRALBCREAT 8 06/05/2024    Lab Results  Component Value Date   CREATININE 1.08 06/15/2023   Lab Results  Component Value Date   GFR 72.68 06/15/2023    ASSESSMENT / PLAN  1. Uncontrolled type 2 diabetes mellitus with hyperglycemia, without long-term current use of insulin  (HCC)   2. Hypogonadotropic hypogonadism      Diabetes Mellitus type 2, complicated by no other known complications. - Diabetic status / severity: Uncontrolled.  Worsening.  Lab Results  Component Value Date   HGBA1C 9.0 (A) 09/05/2024    - Hemoglobin A1c goal : <6.5%  He has not been taking Ozempic  and Jardiance .  Diabetes control is worsening due to not enough medication and probably related to meals during holiday season.  - Medications: No change  I) continue metformin  1000 mg 2 times a day. II) start Ozempic  0.25 mg weekly and increase to 0.5 mg weekly after 4 weeks. III) start Jardiance  due to high cost. IV) start glimepiride  4 mg daily.  Advised to check cost and coverage regarding Ozempic  and Jardiance  with the pharmacy as he has new medical insurance.  Advised to call our clinic if he is not able to get these medications, to plan for other diabetic medications.  - Home glucose testing: In the morning fasting and occasionally at bedtime.  Asked to bring the glucometer in the clinic visit.   - Discussed/ Gave Hypoglycemia treatment plan.  # Consult : not required at this time.   # Annual urine for microalbuminuria/ creatinine ratio, no microalbuminuria currently, continue ACE/ARB.  On losartan . Last  Lab Results  Component Value Date   MICRALBCREAT 8 06/05/2024   # Foot check nightly.  # Annual dilated diabetic eye exams.   - Diet: Make healthy  diabetic food choices - Life style / activity / exercise: Discussed.  2. Blood pressure  -  BP Readings from Last 1 Encounters:  09/05/24 136/70    - Control is in target.  - No change in current plans.  3. Lipid status / Hyperlipidemia - Last  Lab Results  Component Value Date   LDLCALC 46 11/19/2022   - Continue atorvastatin  10 mg daily and fenofibrate  145 mg daily. --LDL 97 in May 12, 2024.  Reviewed at Care Everywhere.  # Hypogonadotropic hypogonadism - Was on Jatenzo  158 mg 2 times a day with meals.  Was not taking from mid of 2025 due to high cost.  AndroGel  was plan was not covered.  Testosterone  cypionate was planned in October however she has not  started injection.   - Start testosterone  cypionate 200 mg/mL 1 mL every 2 weeks. - First injection was given in the clinic today.  -In regard to ED problem discussed about various causes, including type 2 diabetes mellitus and hypogonadism.  Patient had tried sildenafil, does not like to try anymore.  He has been following with urology, encouraged to follow-up with urology as well.  Diagnoses and all orders for this visit:  Uncontrolled type 2 diabetes mellitus with hyperglycemia, without long-term current use of insulin  (HCC) -     POCT glycosylated hemoglobin (Hb A1C) -     Semaglutide ,0.25 or 0.5MG /DOS, (OZEMPIC , 0.25 OR 0.5 MG/DOSE,) 2 MG/3ML SOPN; Inject 0.25 mg into the skin once a week. And after 4 weeks increase to 0.5 mg weekly. -     empagliflozin  (JARDIANCE ) 25 MG TABS tablet; Take 1 tablet (25 mg total) by mouth daily before breakfast.  Hypogonadotropic hypogonadism -     testosterone  cypionate (DEPOTESTOSTERONE CYPIONATE) 200 MG/ML injection; Inject 1 mL (200 mg total) into the muscle every 14 (fourteen) days. -     Syringe/Needle, Disp, (SYRINGE 3CC/23GX1) 23G X 1 3 ML MISC; Use once every 2 weeks to inject testosterone  -     testosterone  cypionate (DEPOTESTOSTERONE CYPIONATE) injection 200  mg   DISPOSITION Follow up in clinic in 3  months suggested.  Patient prefers to lab at [facility] laboratory locally prior to follow-up visit when needed.     All questions answered and patient verbalized understanding of the plan.  Annalea Alguire, MD [facility] Endocrinology [facility] [address] [place], [address] [place], [place] [address] Phone # [phone]  At least part of this note was generated using voice recognition software. Inadvertent word errors may have occurred, which were not recognized during the proofreading process. "

## 2024-09-26 ENCOUNTER — Telehealth: Payer: Self-pay

## 2024-09-26 ENCOUNTER — Other Ambulatory Visit (HOSPITAL_COMMUNITY): Payer: Self-pay

## 2024-09-26 NOTE — Telephone Encounter (Signed)
 Pharmacy Patient Advocate Encounter   Received notification from [facility] KEY that prior authorization for Ozempic  (0.25 or 0.5 MG/DOSE) 2MG /3ML pen-injectors is required/requested.   Insurance verification completed.   The patient is insured through [facility].   Per test claim: PA required; PA submitted to above mentioned insurance via Latent Key/confirmation #/EOC [id number] Status is pending

## 2024-09-26 NOTE — Telephone Encounter (Signed)
 Pharmacy Patient Advocate Encounter   Received notification from [employer] KEY that prior authorization for Testosterone  Cypionate 200MG /ML intramuscular solution is required/requested.   Insurance verification completed.   The patient is insured through [facility].   Per test claim: PA required; PA submitted to above mentioned insurance via Latent Key/confirmation #/EOC [facility] Status is pending

## 2024-12-14 ENCOUNTER — Ambulatory Visit: Admitting: Endocrinology
